# Patient Record
Sex: Female | Born: 1950
Health system: Southern US, Community
[De-identification: ages and names within clinical notes are randomized; demographics above are authoritative.]

## PROBLEM LIST (undated history)

## (undated) DIAGNOSIS — T7840XA Allergy, unspecified, initial encounter: Secondary | ICD-10-CM

## (undated) DIAGNOSIS — E079 Disorder of thyroid, unspecified: Secondary | ICD-10-CM

## (undated) DIAGNOSIS — K759 Inflammatory liver disease, unspecified: Secondary | ICD-10-CM

## (undated) DIAGNOSIS — F419 Anxiety disorder, unspecified: Secondary | ICD-10-CM

## (undated) DIAGNOSIS — K219 Gastro-esophageal reflux disease without esophagitis: Secondary | ICD-10-CM

## (undated) DIAGNOSIS — M1990 Unspecified osteoarthritis, unspecified site: Secondary | ICD-10-CM

## (undated) DIAGNOSIS — I10 Essential (primary) hypertension: Secondary | ICD-10-CM

## (undated) DIAGNOSIS — R112 Nausea with vomiting, unspecified: Secondary | ICD-10-CM

## (undated) DIAGNOSIS — Z87442 Personal history of urinary calculi: Secondary | ICD-10-CM

## (undated) DIAGNOSIS — G4730 Sleep apnea, unspecified: Secondary | ICD-10-CM

## (undated) DIAGNOSIS — Z9889 Other specified postprocedural states: Secondary | ICD-10-CM

## (undated) DIAGNOSIS — F32A Depression, unspecified: Secondary | ICD-10-CM

## (undated) DIAGNOSIS — H269 Unspecified cataract: Secondary | ICD-10-CM

## (undated) DIAGNOSIS — C801 Malignant (primary) neoplasm, unspecified: Secondary | ICD-10-CM

## (undated) DIAGNOSIS — T8859XA Other complications of anesthesia, initial encounter: Secondary | ICD-10-CM

## (undated) HISTORY — PX: EYE SURGERY: SHX253

## (undated) HISTORY — DX: Unspecified cataract: H26.9

## (undated) HISTORY — DX: Allergy, unspecified, initial encounter: T78.40XA

## (undated) HISTORY — DX: Unspecified osteoarthritis, unspecified site: M19.90

## (undated) HISTORY — PX: BREAST SURGERY: SHX581

## (undated) HISTORY — PX: OTHER SURGICAL HISTORY: SHX169

## (undated) HISTORY — DX: Essential (primary) hypertension: I10

## (undated) HISTORY — DX: Disorder of thyroid, unspecified: E07.9

## (undated) HISTORY — PX: SMALL INTESTINE SURGERY: SHX150

## (undated) HISTORY — PX: UMBILICAL HERNIA REPAIR: SHX196

## (undated) HISTORY — PX: HERNIA REPAIR: SHX51

---

## 1982-08-02 HISTORY — PX: OTHER SURGICAL HISTORY: SHX169

## 1982-08-02 HISTORY — PX: KNEE ARTHROSCOPY W/ DEBRIDEMENT: SHX1867

## 1983-08-03 HISTORY — PX: WISDOM TOOTH EXTRACTION: SHX21

## 1985-08-02 HISTORY — PX: CHOLECYSTECTOMY: SHX55

## 2006-01-26 ENCOUNTER — Other Ambulatory Visit: Admission: RE | Admit: 2006-01-26 | Discharge: 2006-01-26 | Payer: Self-pay | Admitting: Family Medicine

## 2007-06-07 ENCOUNTER — Other Ambulatory Visit: Admission: RE | Admit: 2007-06-07 | Discharge: 2007-06-07 | Payer: Self-pay | Admitting: Family Medicine

## 2008-06-11 ENCOUNTER — Other Ambulatory Visit: Admission: RE | Admit: 2008-06-11 | Discharge: 2008-06-11 | Payer: Self-pay | Admitting: Family Medicine

## 2009-09-19 ENCOUNTER — Other Ambulatory Visit: Admission: RE | Admit: 2009-09-19 | Discharge: 2009-09-19 | Payer: Self-pay | Admitting: Family Medicine

## 2012-06-20 ENCOUNTER — Ambulatory Visit
Admission: RE | Admit: 2012-06-20 | Discharge: 2012-06-20 | Disposition: A | Discharge status: Home / Self Care | Payer: BC Managed Care – PPO | Source: Ambulatory Visit | Attending: Family Medicine | Admitting: Family Medicine

## 2012-06-20 ENCOUNTER — Other Ambulatory Visit: Payer: Self-pay | Admitting: Family Medicine

## 2012-06-20 DIAGNOSIS — R319 Hematuria, unspecified: Secondary | ICD-10-CM

## 2013-07-03 ENCOUNTER — Other Ambulatory Visit (HOSPITAL_COMMUNITY)
Admission: RE | Admit: 2013-07-03 | Discharge: 2013-07-03 | Disposition: A | Discharge status: Home / Self Care | Payer: BC Managed Care – PPO | Source: Ambulatory Visit | Attending: Family Medicine | Admitting: Family Medicine

## 2013-07-03 ENCOUNTER — Other Ambulatory Visit: Payer: Self-pay | Admitting: Family Medicine

## 2013-07-03 DIAGNOSIS — Z124 Encounter for screening for malignant neoplasm of cervix: Secondary | ICD-10-CM | POA: Insufficient documentation

## 2013-07-03 DIAGNOSIS — Z1151 Encounter for screening for human papillomavirus (HPV): Secondary | ICD-10-CM | POA: Insufficient documentation

## 2013-08-02 HISTORY — PX: OTHER SURGICAL HISTORY: SHX169

## 2013-11-26 ENCOUNTER — Ambulatory Visit (INDEPENDENT_AMBULATORY_CARE_PROVIDER_SITE_OTHER): Payer: BC Managed Care – PPO

## 2013-11-26 ENCOUNTER — Ambulatory Visit (INDEPENDENT_AMBULATORY_CARE_PROVIDER_SITE_OTHER): Payer: BC Managed Care – PPO | Admitting: Podiatry

## 2013-11-26 ENCOUNTER — Encounter: Payer: Self-pay | Admitting: Podiatry

## 2013-11-26 VITALS — BP 111/82 | HR 74 | Resp 12

## 2013-11-26 DIAGNOSIS — M7750 Other enthesopathy of unspecified foot: Secondary | ICD-10-CM

## 2013-11-26 DIAGNOSIS — R52 Pain, unspecified: Secondary | ICD-10-CM

## 2013-11-26 MED ORDER — TRIAMCINOLONE ACETONIDE 10 MG/ML IJ SUSP
10.0000 mg | Freq: Once | INTRAMUSCULAR | Status: AC
  Administered 2013-11-26: 10 mg

## 2013-11-26 MED ORDER — DICLOFENAC SODIUM 75 MG PO TBEC: 75.0000 mg | DELAYED_RELEASE_TABLET | Freq: Two times a day (BID) | ORAL | Status: DC

## 2013-11-26 NOTE — Progress Notes (Signed)
Subjective:     Patient ID: Maria Wang, female   DOB: 02-14-1951, 63 y.o.   MRN: [id number]  Foot Pain   patient states the top of her left foot has been sore for about 4 months. Stated she has started a walking program prior to that and then it became irritated   Review of Systems  All other systems reviewed and are negative.      Objective:   Physical Exam  Nursing note and vitals reviewed. Constitutional: She is oriented to person, place, and time.  Cardiovascular: Intact distal pulses.   Musculoskeletal: Normal range of motion.  Neurological: She is oriented to person, place, and time.  Skin: Skin is warm.   neurovascular status is found to be intact with muscle strength adequate and range of motion of the subtalar and midtarsal joint within normal limits. Patient is found to have exquisite discomfort dorsum left foot around the ankle and distal with no indications of fracture     Assessment:     Probable tendinitis of the dorsal left foot with inflammation that has not responded to oral steroid agents and ice therapy    Plan:     H&P and x-rays reviewed. Went ahead and injected the left dorsal complex and tendon complex with 3 mg Kenalog 5 mg Xylocaine Marcaine mixture instructed on physical therapy and wider shoes and reappoint in 2 weeks and also discussed possible long-term orthotics

## 2013-11-26 NOTE — Progress Notes (Signed)
   Subjective:    Patient ID: Maria Wang, female    DOB: 1950-09-28, 63 y.o.   MRN: [id number]  HPI PT STATED LT FOOT IS BEEN PAINFUL FOR 4 MONTHS. THE FOOT IS GETTING WORSE. THE FOOT GET AGGRAVATED BY WALKING. TRIED TO USED ICE, NAPROXEN, AND IBUPROFEN AND IT HELPS.   Review of Systems  Constitutional: Positive for unexpected weight change.  Musculoskeletal: Positive for joint swelling.  Hematological: Bruises/bleeds easily.  All other systems reviewed and are negative.      Objective:   Physical Exam        Assessment & Plan:

## 2013-12-07 IMAGING — CT CT ABD-PELV W/O CM
1 of 4 series · 15 of 36 positions shown, 19 images · non-contrast
Comparison: None.

CLINICAL DATA: Right-sided flank pain and microscopic hematuria.

CT ABDOMEN AND PELVIS WITHOUT CONTRAST
TECHNIQUE: Multidetector CT imaging of the abdomen and pelvis was
performed following the standard protocol without intravenous
contrast.

[Series 2: renal stone w/o · axial · non-contrast · 0.98mm/px · z∈[-470,-50]mm · 15 of 92 slices shown, 19 images]
[im 4/92  soft-tissue]
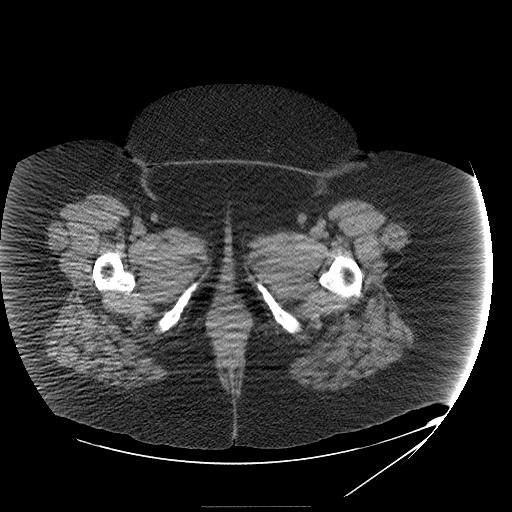
[im 4/92  bone]
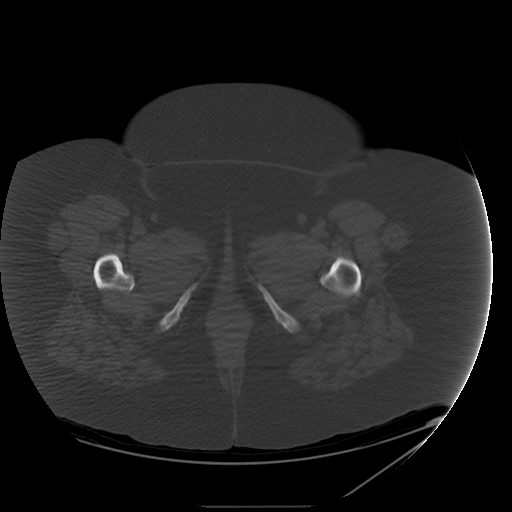
[im 12/92  soft-tissue]
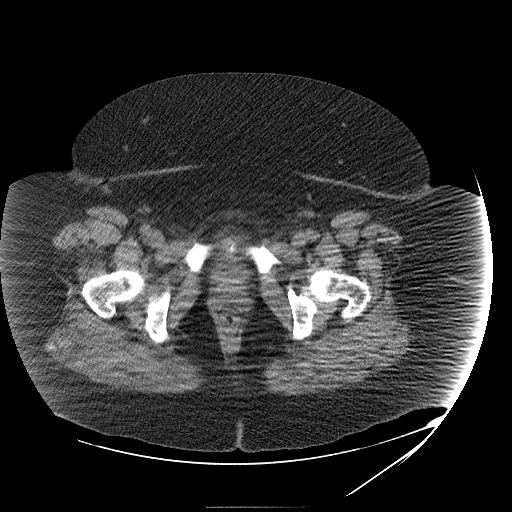
[im 19/92  soft-tissue]
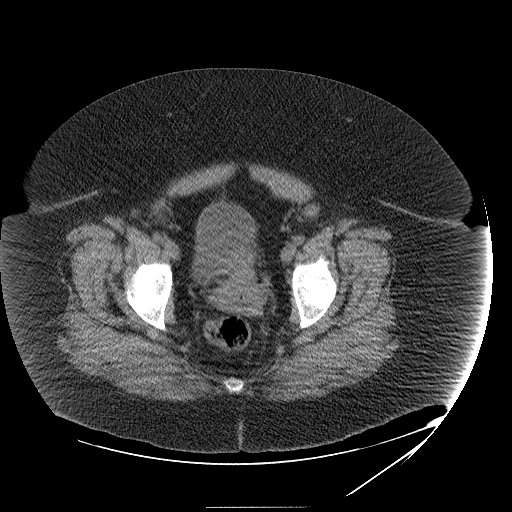
[im 27/92  soft-tissue]
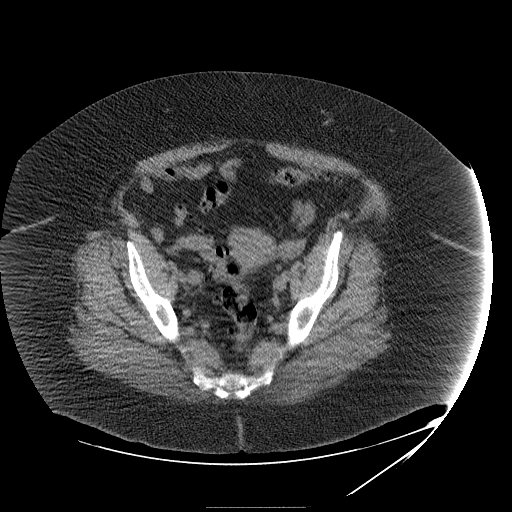
[im 31/92  soft-tissue]
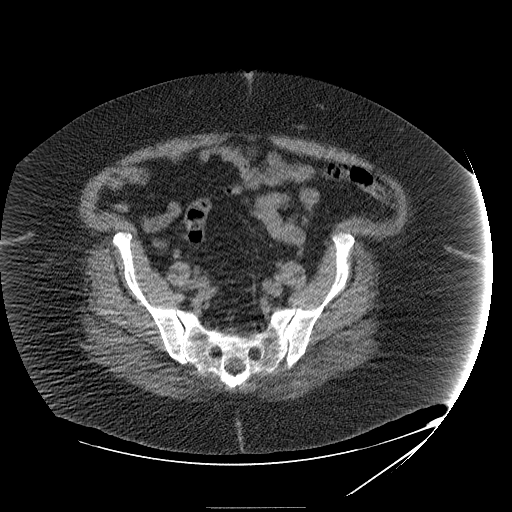
[im 38/92  soft-tissue]
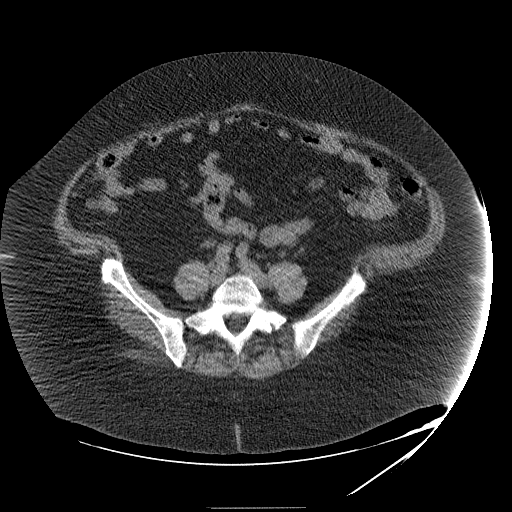
[im 46/92  soft-tissue]
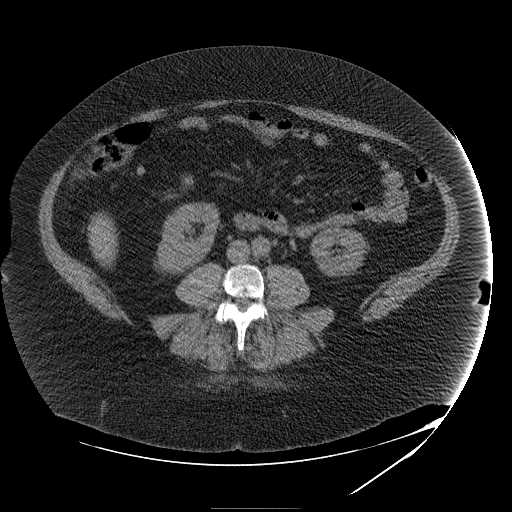
[im 54/92  soft-tissue]
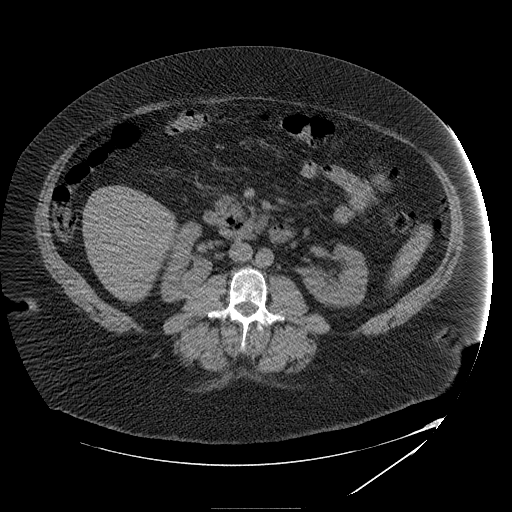
[im 61/92  soft-tissue]
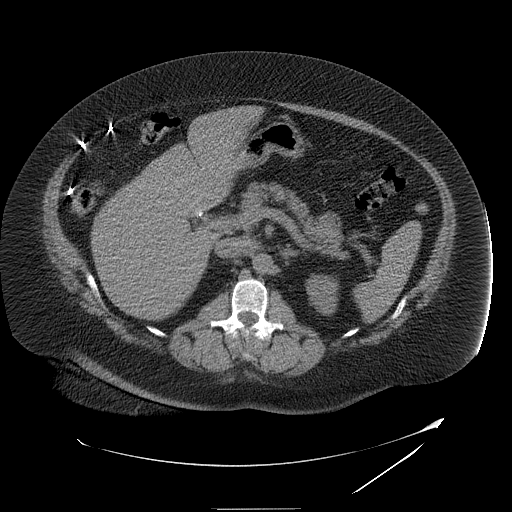
[im 61/92  bone]
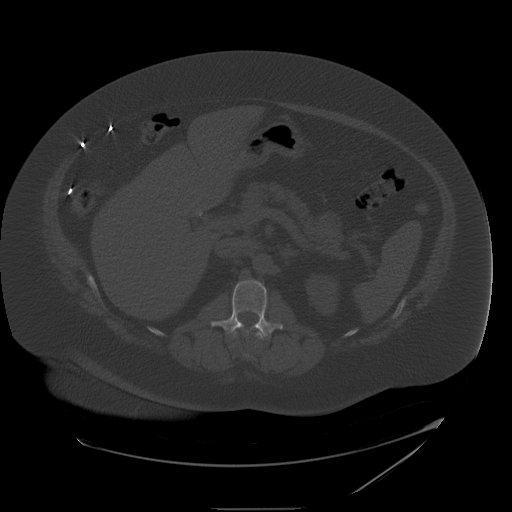
[im 65/92  soft-tissue]
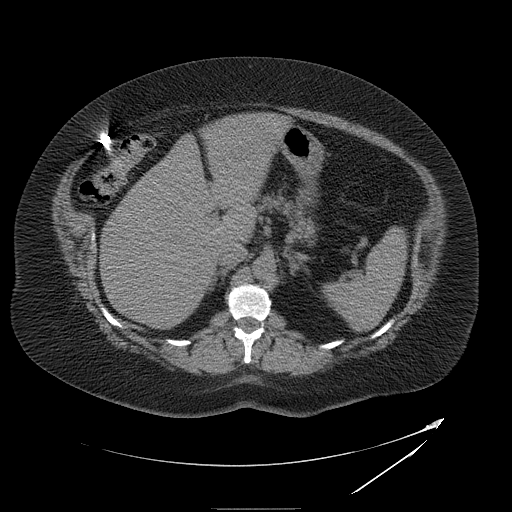
[im 73/92  soft-tissue]
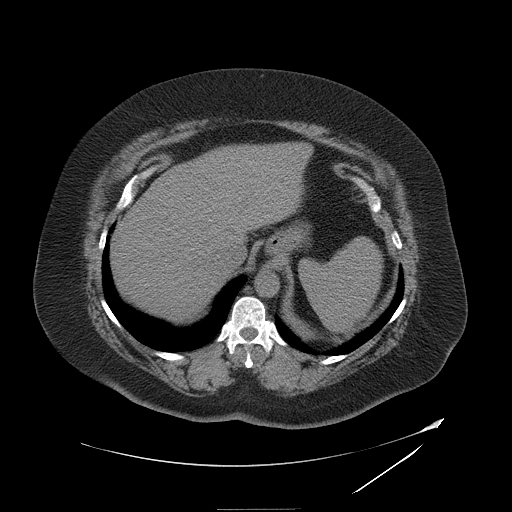
[im 76/92  lung]
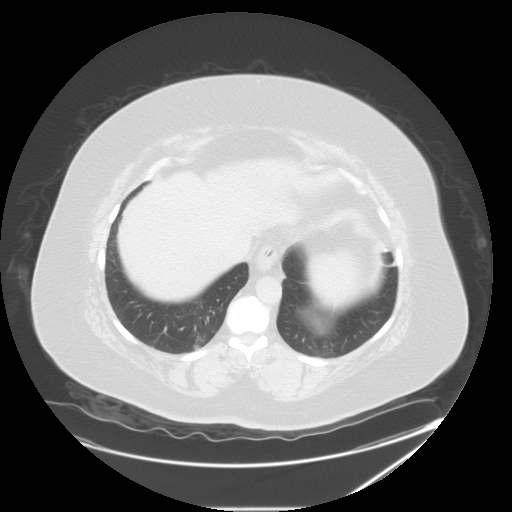
[im 80/92  soft-tissue]
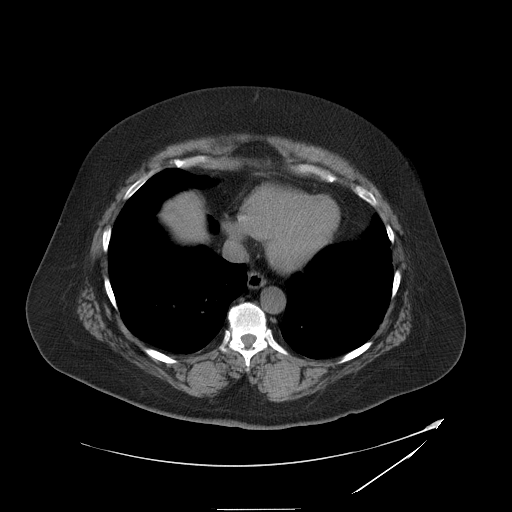
[im 80/92  lung]
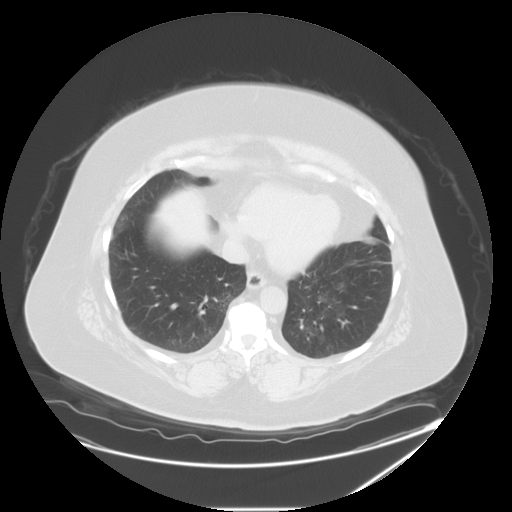
[im 84/92  lung]
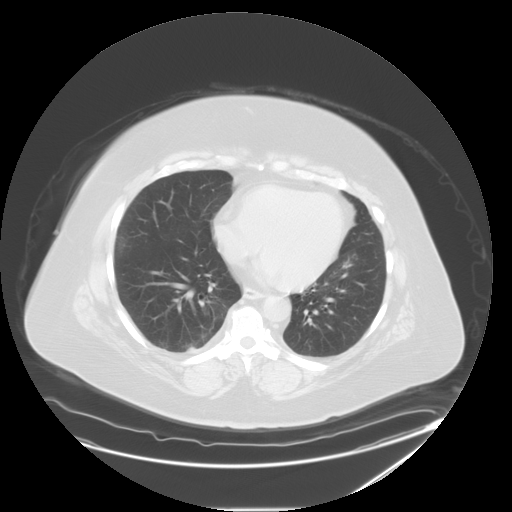
[im 88/92  soft-tissue]
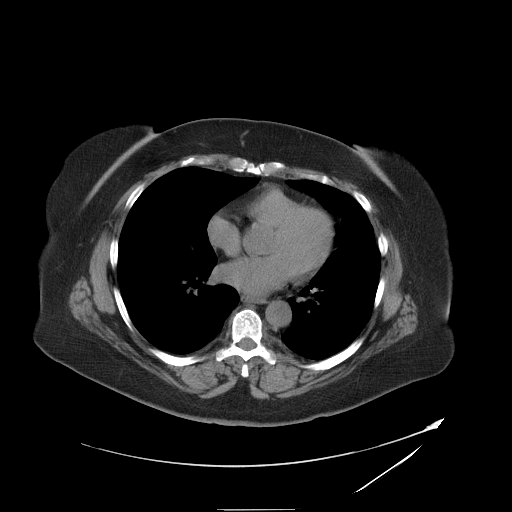
[im 88/92  lung]
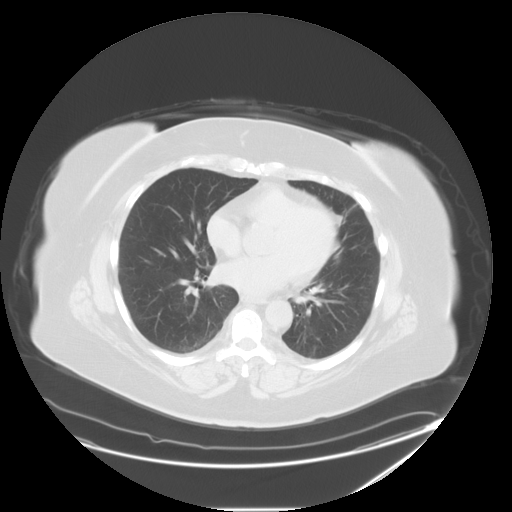

[15 of 36 positions shown; findings below may reference images not displayed]

FINDINGS: No evidence of renal, ureteral or bladder calculi.  No
obvious mass lesions are identified by unenhanced CT.

The gallbladder has been removed.  The liver, spleen, pancreas and
bowel are unremarkable.  No hernias are seen.  No free fluid or
abnormal fluid collections.  Bony structures are unremarkable.
IMPRESSION: Unremarkable unenhanced CT demonstrating no evidence of renal
calculi or other abnormalities.

## 2013-12-10 ENCOUNTER — Ambulatory Visit (INDEPENDENT_AMBULATORY_CARE_PROVIDER_SITE_OTHER): Payer: BC Managed Care – PPO | Admitting: Podiatry

## 2013-12-10 ENCOUNTER — Encounter: Payer: Self-pay | Admitting: Podiatry

## 2013-12-10 VITALS — BP 128/72 | HR 78 | Resp 16

## 2013-12-10 DIAGNOSIS — M7750 Other enthesopathy of unspecified foot: Secondary | ICD-10-CM

## 2013-12-10 NOTE — Progress Notes (Signed)
Subjective:     Patient ID: Maria Wang, female   DOB: [birth year], 63 y.o.   MRN: [id number]  HPI patient states that my left foot feel some better than previously with discomfort if him on it all the time with gradual recurrence of discomfort over the last couple of weeks   Review of Systems     Objective:   Physical Exam Neurovascular status intact with discomfort dorsal left foot which is reduced from previous but present with deep palpation    Assessment:     Tendinitis left foot with foot structure as part of the problem the patient is experiencing    Plan:     H&P performed and discussed continued physical therapy for the area future injections and I didn't go ahead today and I scanned for custom orthotics to reduce pressure against the feet

## 2013-12-28 ENCOUNTER — Ambulatory Visit (INDEPENDENT_AMBULATORY_CARE_PROVIDER_SITE_OTHER): Payer: BC Managed Care – PPO | Admitting: *Deleted

## 2013-12-28 DIAGNOSIS — M7750 Other enthesopathy of unspecified foot: Secondary | ICD-10-CM

## 2013-12-28 NOTE — Patient Instructions (Signed)

## 2013-12-28 NOTE — Progress Notes (Signed)
   Subjective:    Patient ID: Maria Wang, female    DOB: May 19, 1951, 63 y.o.   MRN: [id number]  HPI PICK UP ORTHOTICS AND GIVEN INSTRUCTION.    Review of Systems     Objective:   Physical Exam        Assessment & Plan:

## 2013-12-31 ENCOUNTER — Other Ambulatory Visit: Payer: BC Managed Care – PPO

## 2014-07-18 ENCOUNTER — Ambulatory Visit (INDEPENDENT_AMBULATORY_CARE_PROVIDER_SITE_OTHER): Payer: BC Managed Care – PPO

## 2014-07-18 ENCOUNTER — Encounter: Payer: Self-pay | Admitting: Podiatry

## 2014-07-18 ENCOUNTER — Ambulatory Visit (INDEPENDENT_AMBULATORY_CARE_PROVIDER_SITE_OTHER): Payer: BC Managed Care – PPO | Admitting: Podiatry

## 2014-07-18 VITALS — BP 159/78 | HR 58 | Resp 16

## 2014-07-18 DIAGNOSIS — M779 Enthesopathy, unspecified: Secondary | ICD-10-CM

## 2014-07-18 MED ORDER — TRIAMCINOLONE ACETONIDE 10 MG/ML IJ SUSP
10.0000 mg | Freq: Once | INTRAMUSCULAR | Status: AC
  Administered 2014-07-18: 10 mg

## 2014-07-21 NOTE — Progress Notes (Signed)
Subjective:     Patient ID: Maria Wang, female   DOB: [birth year], 63 y.o.   MRN: [id number]  HPI patient points to left foot stating I really have been hurting and it doesn't seem to quite be in the same place as it was last time   Review of Systems     Objective:   Physical Exam Neurovascular status intact with patient noted to have discomfort which is moved more to the subtalar joint and lateral ankle with inflammation and fluid buildup noted upon palpation    Assessment:     Inflammatory capsulitis tendinitis left ankle with pain mostly occurring within the subtalar joint    Plan:     Sinus tarsitis left with inflammatory changes that today I injected with 3 mg Kenalog 5 mg Xylocaine which was tolerated well. Reappoint if symptoms persist and may require MRI

## 2014-09-23 ENCOUNTER — Encounter: Payer: Self-pay | Admitting: Physician Assistant

## 2014-10-04 ENCOUNTER — Ambulatory Visit (INDEPENDENT_AMBULATORY_CARE_PROVIDER_SITE_OTHER): Payer: 59

## 2014-10-04 DIAGNOSIS — R42 Dizziness and giddiness: Secondary | ICD-10-CM

## 2014-10-04 NOTE — Progress Notes (Signed)
Exercise Treadmill Test  Pre-Exercise Testing Evaluation Rhythm: normal sinus  Rate: 72 bpm     Test  Exercise Tolerance Test Ordering MD: Sherren Mocha, MD  Interpreting MD: Nell Range, PA-C  Unique Test No: 1  Treadmill:  1  Indication for ETT: Dizzy  Contraindication to ETT: No   Stress Modality: exercise - treadmill  Cardiac Imaging Performed: non   Protocol: standard Bruce - maximal  Max BP:  242/90  Max MPHR (bpm):  157 85% MPR (bpm):  133  MPHR obtained (bpm):  146 % MPHR obtained: 93%  Reached 85% MPHR (min:sec): 2 min 27sec Total Exercise Time (min-sec):  19min  Workload in METS:  4.6 Borg Scale: 17  Reason ETT Terminated:  desired heart rate attained    ST Segment Analysis At Rest: normal ST segments - no evidence of significant ST depression With Exercise: no evidence of significant ST depression  Other Information Arrhythmia:  No  Few PVCs. Angina during ETT:  absent (0) Quality of ETT:  diagnostic  ETT Interpretation:  normal - no evidence of ischemia by ST analysis  Comments: She had some SOB i suspect from deconditioning. She did have some mild dizziness after her stress test that is typical for her. No CP.   Recommendations: Follow up with PCP.

## 2015-10-06 DIAGNOSIS — E2839 Other primary ovarian failure: Secondary | ICD-10-CM | POA: Diagnosis not present

## 2015-10-06 DIAGNOSIS — M8589 Other specified disorders of bone density and structure, multiple sites: Secondary | ICD-10-CM | POA: Diagnosis not present

## 2015-12-25 DIAGNOSIS — H40013 Open angle with borderline findings, low risk, bilateral: Secondary | ICD-10-CM | POA: Diagnosis not present

## 2015-12-25 DIAGNOSIS — H53023 Refractive amblyopia, bilateral: Secondary | ICD-10-CM | POA: Diagnosis not present

## 2015-12-25 DIAGNOSIS — H1859 Other hereditary corneal dystrophies: Secondary | ICD-10-CM | POA: Diagnosis not present

## 2015-12-25 DIAGNOSIS — H18603 Keratoconus, unspecified, bilateral: Secondary | ICD-10-CM | POA: Diagnosis not present

## 2016-01-12 DIAGNOSIS — G4733 Obstructive sleep apnea (adult) (pediatric): Secondary | ICD-10-CM | POA: Diagnosis not present

## 2016-01-27 DIAGNOSIS — G4733 Obstructive sleep apnea (adult) (pediatric): Secondary | ICD-10-CM | POA: Diagnosis not present

## 2016-01-29 DIAGNOSIS — G4733 Obstructive sleep apnea (adult) (pediatric): Secondary | ICD-10-CM | POA: Diagnosis not present

## 2016-02-25 ENCOUNTER — Ambulatory Visit
Admission: RE | Admit: 2016-02-25 | Discharge: 2016-02-25 | Disposition: A | Discharge status: Home / Self Care | Payer: PPO | Source: Ambulatory Visit | Attending: Family Medicine | Admitting: Family Medicine

## 2016-02-25 ENCOUNTER — Other Ambulatory Visit: Payer: Self-pay | Admitting: Family Medicine

## 2016-02-25 DIAGNOSIS — R609 Edema, unspecified: Secondary | ICD-10-CM

## 2016-02-25 DIAGNOSIS — M79604 Pain in right leg: Secondary | ICD-10-CM

## 2016-02-25 DIAGNOSIS — M79661 Pain in right lower leg: Secondary | ICD-10-CM | POA: Diagnosis not present

## 2016-02-25 DIAGNOSIS — M7989 Other specified soft tissue disorders: Secondary | ICD-10-CM | POA: Diagnosis not present

## 2016-02-25 DIAGNOSIS — I10 Essential (primary) hypertension: Secondary | ICD-10-CM | POA: Diagnosis not present

## 2016-02-28 DIAGNOSIS — G4733 Obstructive sleep apnea (adult) (pediatric): Secondary | ICD-10-CM | POA: Diagnosis not present

## 2016-03-10 DIAGNOSIS — I10 Essential (primary) hypertension: Secondary | ICD-10-CM | POA: Diagnosis not present

## 2016-03-24 DIAGNOSIS — I10 Essential (primary) hypertension: Secondary | ICD-10-CM | POA: Diagnosis not present

## 2016-03-29 DIAGNOSIS — G4733 Obstructive sleep apnea (adult) (pediatric): Secondary | ICD-10-CM | POA: Diagnosis not present

## 2016-03-30 DIAGNOSIS — G4733 Obstructive sleep apnea (adult) (pediatric): Secondary | ICD-10-CM | POA: Diagnosis not present

## 2016-04-09 DIAGNOSIS — W19XXXA Unspecified fall, initial encounter: Secondary | ICD-10-CM | POA: Diagnosis not present

## 2016-04-09 DIAGNOSIS — S8011XA Contusion of right lower leg, initial encounter: Secondary | ICD-10-CM | POA: Diagnosis not present

## 2016-04-30 DIAGNOSIS — G4733 Obstructive sleep apnea (adult) (pediatric): Secondary | ICD-10-CM | POA: Diagnosis not present

## 2016-05-30 DIAGNOSIS — G4733 Obstructive sleep apnea (adult) (pediatric): Secondary | ICD-10-CM | POA: Diagnosis not present

## 2016-06-30 DIAGNOSIS — G4733 Obstructive sleep apnea (adult) (pediatric): Secondary | ICD-10-CM | POA: Diagnosis not present

## 2016-07-08 ENCOUNTER — Ambulatory Visit (INDEPENDENT_AMBULATORY_CARE_PROVIDER_SITE_OTHER): Payer: PPO

## 2016-07-08 ENCOUNTER — Encounter: Payer: Self-pay | Admitting: Podiatry

## 2016-07-08 ENCOUNTER — Ambulatory Visit: Payer: PPO

## 2016-07-08 ENCOUNTER — Ambulatory Visit (INDEPENDENT_AMBULATORY_CARE_PROVIDER_SITE_OTHER): Payer: PPO | Admitting: Podiatry

## 2016-07-08 DIAGNOSIS — M779 Enthesopathy, unspecified: Secondary | ICD-10-CM | POA: Diagnosis not present

## 2016-07-08 DIAGNOSIS — M79672 Pain in left foot: Secondary | ICD-10-CM

## 2016-07-08 DIAGNOSIS — M79671 Pain in right foot: Secondary | ICD-10-CM

## 2016-07-08 MED ORDER — TRIAMCINOLONE ACETONIDE 10 MG/ML IJ SUSP
10.0000 mg | Freq: Once | INTRAMUSCULAR | Status: AC
  Administered 2016-07-08: 10 mg

## 2016-07-11 NOTE — Progress Notes (Signed)
Subjective:     Patient ID: Maria Wang, female   DOB: [birth year], 65 y.o.   MRN: [id number]  HPI patient presents with pain in the sinus tarsi bilateral and long-term problems with ambulation   Review of Systems     Objective:   Physical Exam Neurovascular status intact muscle strength adequate with moderate discomfort sinus tarsi bilateral and also dorsal tendinitis left    Assessment:     Inflammatory capsulitis bilateral with moderate structural abnormalities of the arch    Plan:     H&P x-rays reviewed and injected the inflammatory areas 3 mg Kenalog 5 mg Xylocaine discussed long-term orthotics and we'll make decision at next visit  X-ray report indicates mild arthritis with no indications of stress fracture or other bone pathology with depression of the arch noted upon x-ray

## 2016-07-15 ENCOUNTER — Ambulatory Visit (INDEPENDENT_AMBULATORY_CARE_PROVIDER_SITE_OTHER): Payer: PPO | Admitting: Podiatry

## 2016-07-15 ENCOUNTER — Encounter: Payer: Self-pay | Admitting: Podiatry

## 2016-07-15 DIAGNOSIS — M79672 Pain in left foot: Secondary | ICD-10-CM | POA: Diagnosis not present

## 2016-07-15 DIAGNOSIS — M779 Enthesopathy, unspecified: Secondary | ICD-10-CM

## 2016-07-15 NOTE — Progress Notes (Signed)
Subjective:     Patient ID: Maria Wang, female   DOB: 1951-01-19, 65 y.o.   MRN: [id number]  HPI patient states the pain is a lot better with minimal discomfort still in feeling that she needs support   Review of Systems     Objective:   Physical Exam Neurovascular status intact with inflammatory capsulitis forefoot right over left with tendinitis left also still noted    Assessment:     Inflammatory capsulitis tendinitis that's improved with treatment with structural biomechanical changes    Plan:     H&P condition reviewed and recommended long-term orthotics to reduce pressure. Patient is scanned for customized orthotic devices currently and was given instructions on physical therapy and shoe gear modifications. Reappoint to recheck

## 2016-07-30 DIAGNOSIS — G4733 Obstructive sleep apnea (adult) (pediatric): Secondary | ICD-10-CM | POA: Diagnosis not present

## 2016-08-06 ENCOUNTER — Ambulatory Visit (INDEPENDENT_AMBULATORY_CARE_PROVIDER_SITE_OTHER): Payer: Self-pay

## 2016-08-06 DIAGNOSIS — M779 Enthesopathy, unspecified: Secondary | ICD-10-CM

## 2016-08-06 NOTE — Patient Instructions (Signed)

## 2016-08-09 NOTE — Progress Notes (Signed)
Patient presents for orthotic pick up.  Verbal and written break in and wear instructions given.  Patient will follow up in 4 weeks if symptoms worsen or fail to improve. 

## 2016-08-16 DIAGNOSIS — H353132 Nonexudative age-related macular degeneration, bilateral, intermediate dry stage: Secondary | ICD-10-CM | POA: Diagnosis not present

## 2016-08-16 DIAGNOSIS — H40013 Open angle with borderline findings, low risk, bilateral: Secondary | ICD-10-CM | POA: Diagnosis not present

## 2016-08-16 DIAGNOSIS — H35033 Hypertensive retinopathy, bilateral: Secondary | ICD-10-CM | POA: Diagnosis not present

## 2016-08-30 DIAGNOSIS — G4733 Obstructive sleep apnea (adult) (pediatric): Secondary | ICD-10-CM | POA: Diagnosis not present

## 2016-08-31 DIAGNOSIS — Z1211 Encounter for screening for malignant neoplasm of colon: Secondary | ICD-10-CM | POA: Diagnosis not present

## 2016-08-31 DIAGNOSIS — K621 Rectal polyp: Secondary | ICD-10-CM | POA: Diagnosis not present

## 2016-08-31 DIAGNOSIS — K5730 Diverticulosis of large intestine without perforation or abscess without bleeding: Secondary | ICD-10-CM | POA: Diagnosis not present

## 2016-08-31 LAB — HM COLONOSCOPY

## 2016-09-03 DIAGNOSIS — Z1211 Encounter for screening for malignant neoplasm of colon: Secondary | ICD-10-CM | POA: Diagnosis not present

## 2016-09-03 DIAGNOSIS — K621 Rectal polyp: Secondary | ICD-10-CM | POA: Diagnosis not present

## 2016-09-07 DIAGNOSIS — H25012 Cortical age-related cataract, left eye: Secondary | ICD-10-CM | POA: Diagnosis not present

## 2016-09-07 DIAGNOSIS — H2512 Age-related nuclear cataract, left eye: Secondary | ICD-10-CM | POA: Diagnosis not present

## 2016-09-07 DIAGNOSIS — H25812 Combined forms of age-related cataract, left eye: Secondary | ICD-10-CM | POA: Diagnosis not present

## 2016-09-22 DIAGNOSIS — H2511 Age-related nuclear cataract, right eye: Secondary | ICD-10-CM | POA: Diagnosis not present

## 2016-09-22 DIAGNOSIS — H353132 Nonexudative age-related macular degeneration, bilateral, intermediate dry stage: Secondary | ICD-10-CM | POA: Diagnosis not present

## 2016-09-22 DIAGNOSIS — H25011 Cortical age-related cataract, right eye: Secondary | ICD-10-CM | POA: Diagnosis not present

## 2016-09-22 DIAGNOSIS — H35033 Hypertensive retinopathy, bilateral: Secondary | ICD-10-CM | POA: Diagnosis not present

## 2016-09-28 DIAGNOSIS — H25811 Combined forms of age-related cataract, right eye: Secondary | ICD-10-CM | POA: Diagnosis not present

## 2016-09-28 DIAGNOSIS — H2511 Age-related nuclear cataract, right eye: Secondary | ICD-10-CM | POA: Diagnosis not present

## 2016-09-29 DIAGNOSIS — G4733 Obstructive sleep apnea (adult) (pediatric): Secondary | ICD-10-CM | POA: Diagnosis not present

## 2016-10-04 DIAGNOSIS — H353132 Nonexudative age-related macular degeneration, bilateral, intermediate dry stage: Secondary | ICD-10-CM | POA: Diagnosis not present

## 2016-10-04 DIAGNOSIS — H35033 Hypertensive retinopathy, bilateral: Secondary | ICD-10-CM | POA: Diagnosis not present

## 2016-10-20 DIAGNOSIS — G4733 Obstructive sleep apnea (adult) (pediatric): Secondary | ICD-10-CM | POA: Diagnosis not present

## 2016-10-28 DIAGNOSIS — G4733 Obstructive sleep apnea (adult) (pediatric): Secondary | ICD-10-CM | POA: Diagnosis not present

## 2016-11-01 DIAGNOSIS — Z6841 Body Mass Index (BMI) 40.0 and over, adult: Secondary | ICD-10-CM | POA: Diagnosis not present

## 2016-11-01 DIAGNOSIS — Z23 Encounter for immunization: Secondary | ICD-10-CM | POA: Diagnosis not present

## 2016-11-01 DIAGNOSIS — Z0000 Encounter for general adult medical examination without abnormal findings: Secondary | ICD-10-CM | POA: Diagnosis not present

## 2016-11-01 DIAGNOSIS — E785 Hyperlipidemia, unspecified: Secondary | ICD-10-CM | POA: Diagnosis not present

## 2016-11-01 DIAGNOSIS — I10 Essential (primary) hypertension: Secondary | ICD-10-CM | POA: Diagnosis not present

## 2016-11-01 DIAGNOSIS — G4733 Obstructive sleep apnea (adult) (pediatric): Secondary | ICD-10-CM | POA: Diagnosis not present

## 2016-11-01 DIAGNOSIS — F3341 Major depressive disorder, recurrent, in partial remission: Secondary | ICD-10-CM | POA: Diagnosis not present

## 2016-11-28 DIAGNOSIS — G4733 Obstructive sleep apnea (adult) (pediatric): Secondary | ICD-10-CM | POA: Diagnosis not present

## 2016-12-28 DIAGNOSIS — G4733 Obstructive sleep apnea (adult) (pediatric): Secondary | ICD-10-CM | POA: Diagnosis not present

## 2017-01-28 DIAGNOSIS — G4733 Obstructive sleep apnea (adult) (pediatric): Secondary | ICD-10-CM | POA: Diagnosis not present

## 2017-02-10 DIAGNOSIS — H40013 Open angle with borderline findings, low risk, bilateral: Secondary | ICD-10-CM | POA: Diagnosis not present

## 2017-02-10 DIAGNOSIS — H18603 Keratoconus, unspecified, bilateral: Secondary | ICD-10-CM | POA: Diagnosis not present

## 2017-02-10 DIAGNOSIS — H1859 Other hereditary corneal dystrophies: Secondary | ICD-10-CM | POA: Diagnosis not present

## 2017-02-10 DIAGNOSIS — H04123 Dry eye syndrome of bilateral lacrimal glands: Secondary | ICD-10-CM | POA: Diagnosis not present

## 2017-03-29 DIAGNOSIS — G4733 Obstructive sleep apnea (adult) (pediatric): Secondary | ICD-10-CM | POA: Diagnosis not present

## 2017-05-06 DIAGNOSIS — A084 Viral intestinal infection, unspecified: Secondary | ICD-10-CM | POA: Diagnosis not present

## 2017-05-28 DIAGNOSIS — S51852A Open bite of left forearm, initial encounter: Secondary | ICD-10-CM | POA: Diagnosis not present

## 2017-05-28 DIAGNOSIS — W5501XA Bitten by cat, initial encounter: Secondary | ICD-10-CM | POA: Diagnosis not present

## 2017-05-30 DIAGNOSIS — S51852D Open bite of left forearm, subsequent encounter: Secondary | ICD-10-CM | POA: Diagnosis not present

## 2017-05-30 DIAGNOSIS — L02414 Cutaneous abscess of left upper limb: Secondary | ICD-10-CM | POA: Diagnosis not present

## 2017-05-30 DIAGNOSIS — W5501XA Bitten by cat, initial encounter: Secondary | ICD-10-CM | POA: Diagnosis not present

## 2017-06-01 DIAGNOSIS — S51852D Open bite of left forearm, subsequent encounter: Secondary | ICD-10-CM | POA: Diagnosis not present

## 2017-06-01 DIAGNOSIS — W5501XD Bitten by cat, subsequent encounter: Secondary | ICD-10-CM | POA: Diagnosis not present

## 2017-06-02 DIAGNOSIS — Z1231 Encounter for screening mammogram for malignant neoplasm of breast: Secondary | ICD-10-CM | POA: Diagnosis not present

## 2017-06-02 DIAGNOSIS — G4733 Obstructive sleep apnea (adult) (pediatric): Secondary | ICD-10-CM | POA: Diagnosis not present

## 2017-06-07 DIAGNOSIS — R922 Inconclusive mammogram: Secondary | ICD-10-CM | POA: Diagnosis not present

## 2017-07-07 DIAGNOSIS — H35371 Puckering of macula, right eye: Secondary | ICD-10-CM | POA: Diagnosis not present

## 2017-07-07 DIAGNOSIS — H35033 Hypertensive retinopathy, bilateral: Secondary | ICD-10-CM | POA: Diagnosis not present

## 2017-07-07 DIAGNOSIS — H353132 Nonexudative age-related macular degeneration, bilateral, intermediate dry stage: Secondary | ICD-10-CM | POA: Diagnosis not present

## 2017-07-07 DIAGNOSIS — H40013 Open angle with borderline findings, low risk, bilateral: Secondary | ICD-10-CM | POA: Diagnosis not present

## 2017-08-13 IMAGING — US US EXTREM LOW VENOUS*R*
1 series · 13 of 24 positions shown · non-contrast
Comparison: None.

CLINICAL DATA: Right leg swelling and pain



[Series 1: us extrem low venous*right* · 13 of 34 slices shown]
[im 1/34]
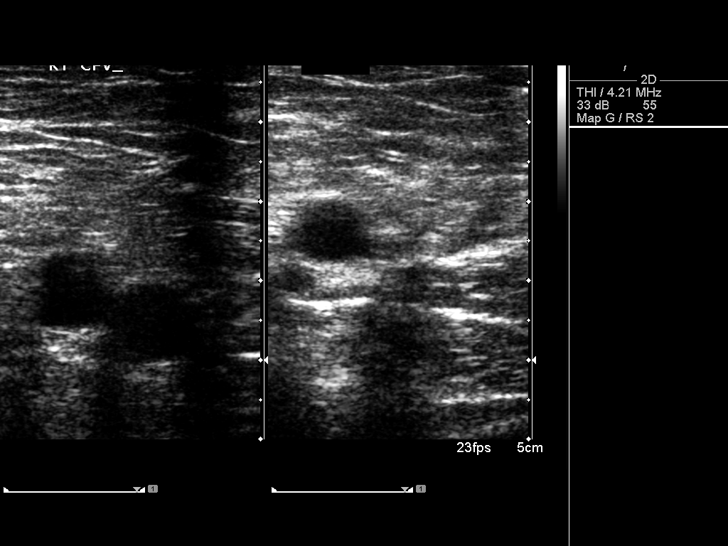
[im 3/34]
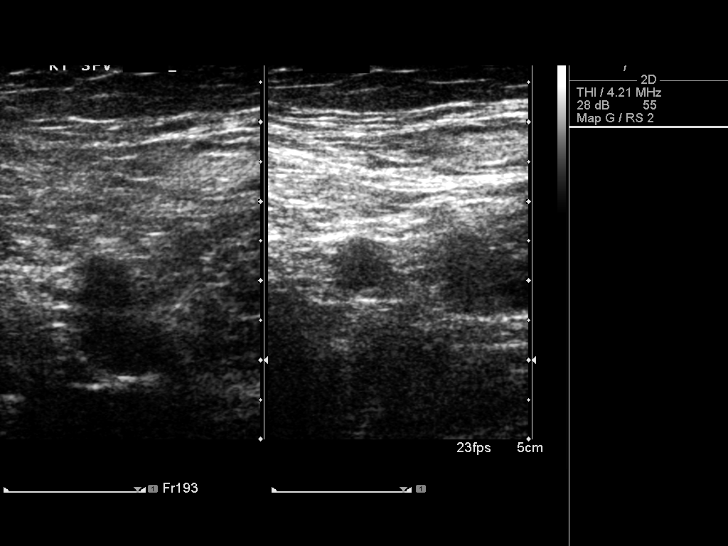
[im 6/34]
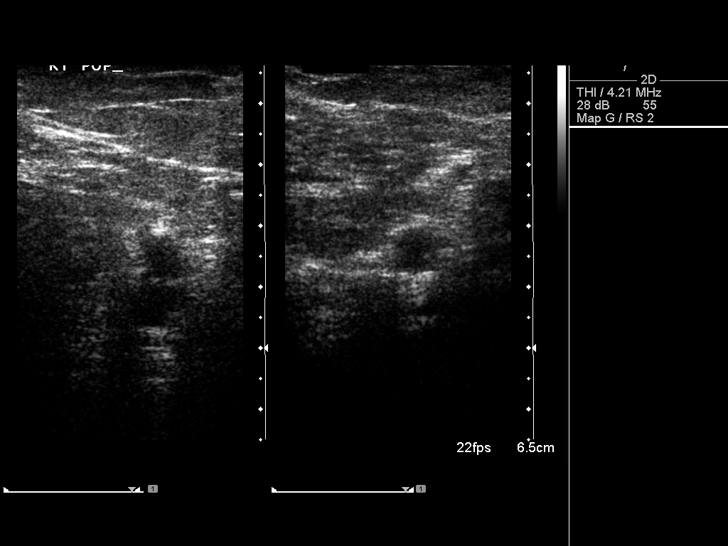
[im 9/34]
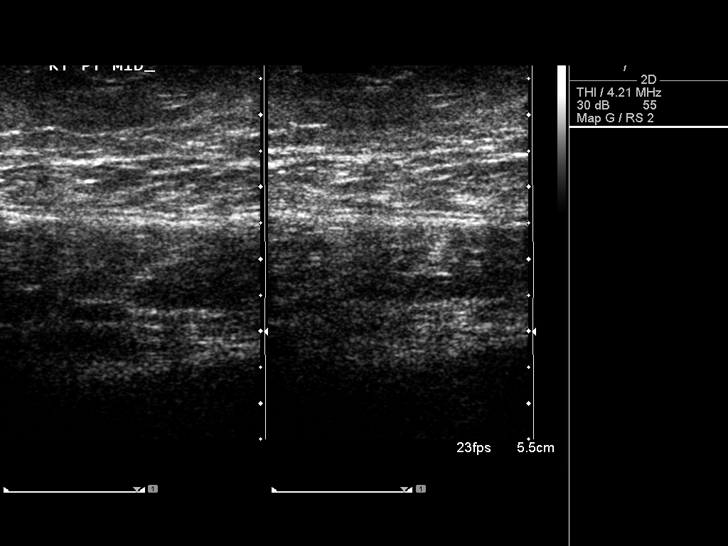
[im 12/34]
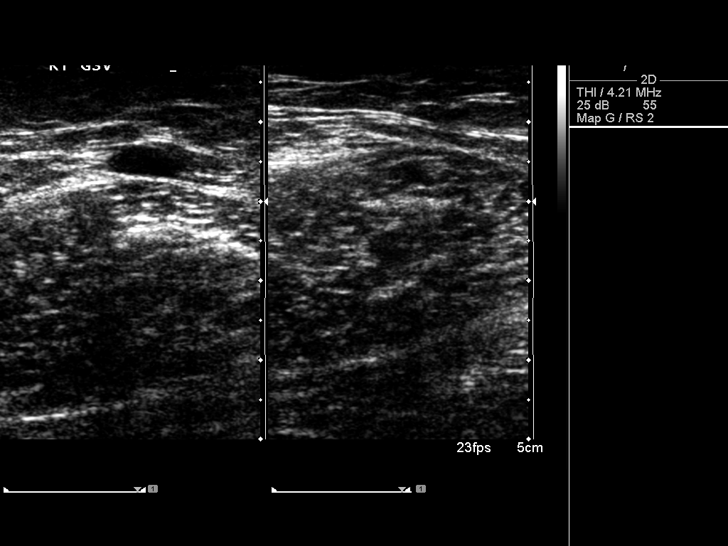
[im 15/34]
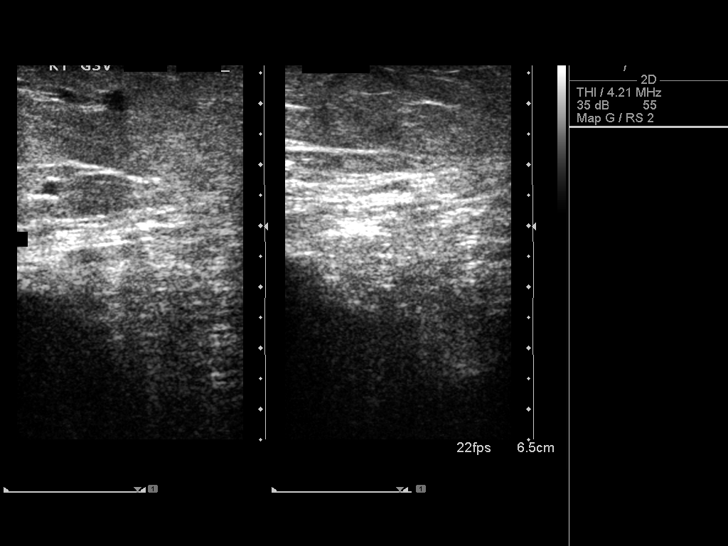
[im 18/34]
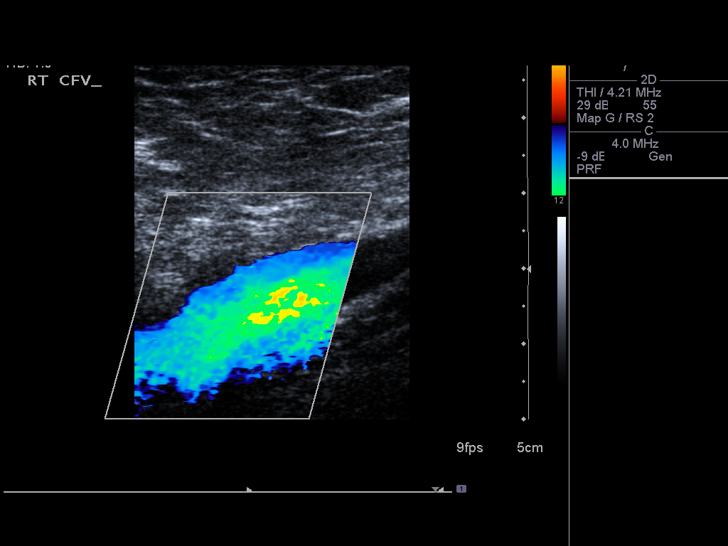
[im 19/34]
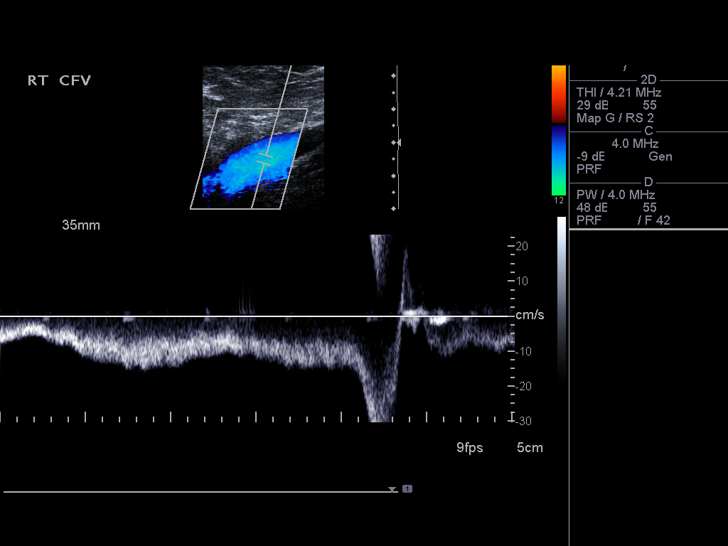
[im 22/34]
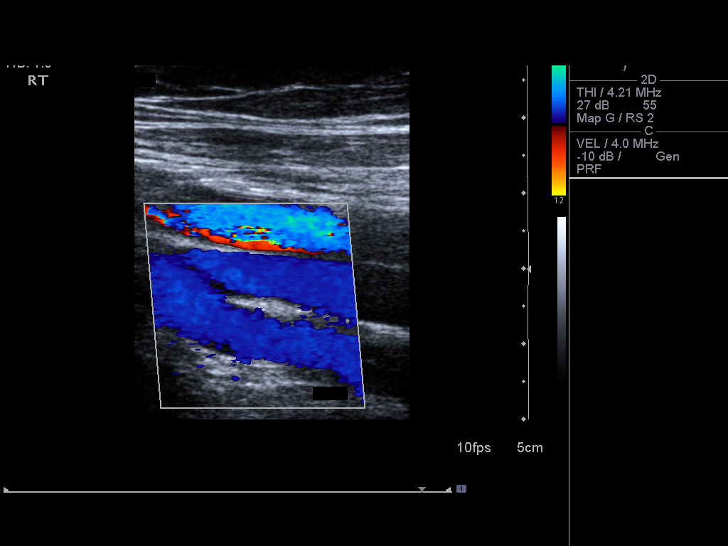
[im 25/34]
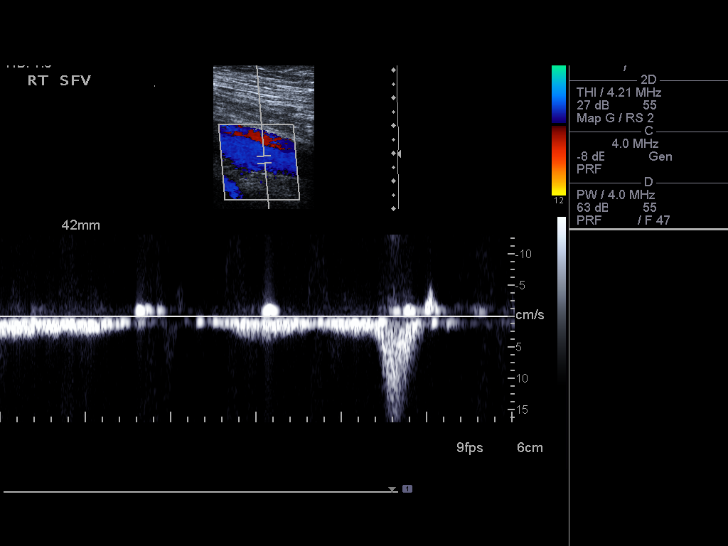
[im 28/34]
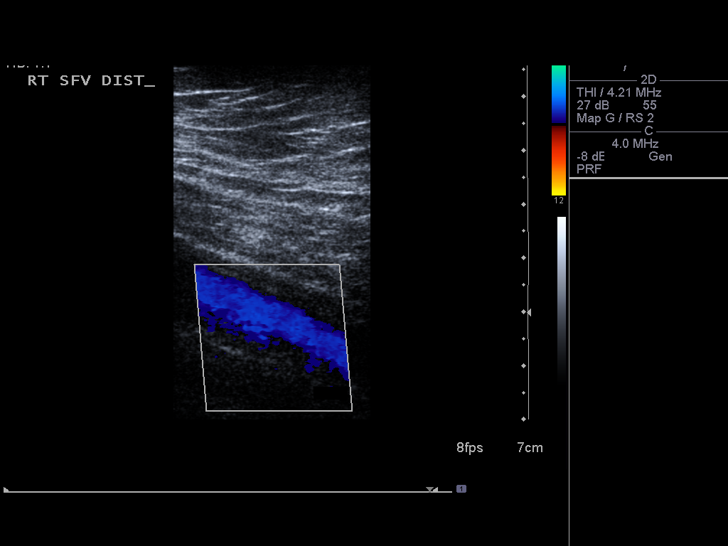
[im 31/34]
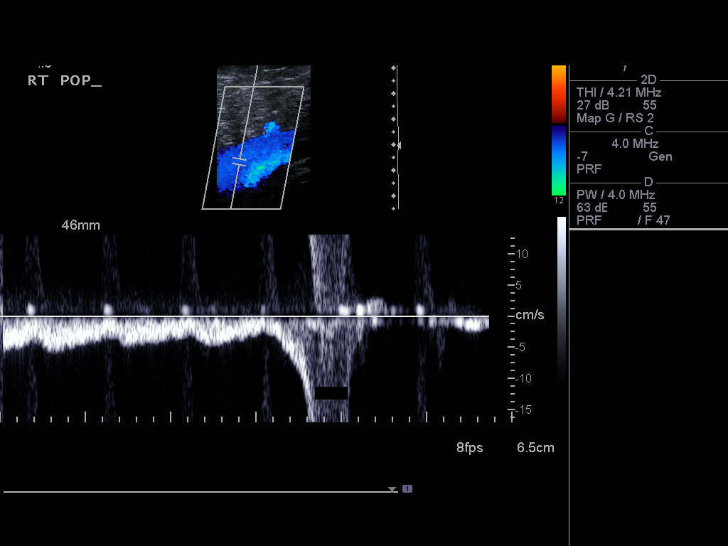
[im 34/34]
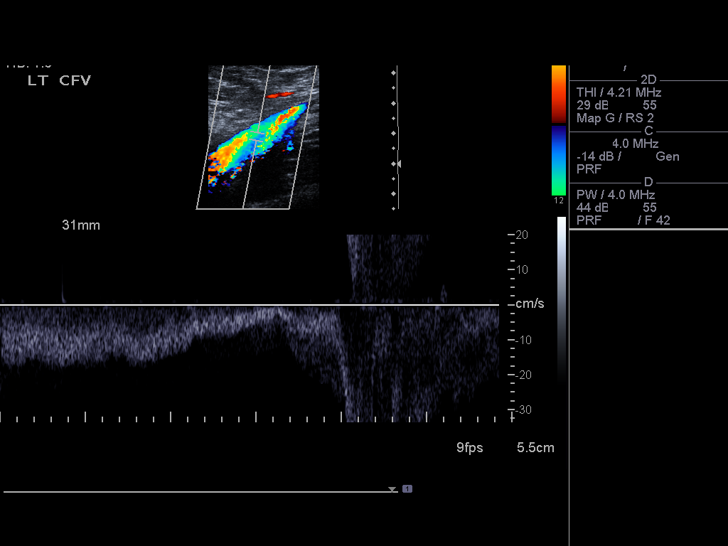

[13 of 24 positions shown; findings below may reference images not displayed]

FINDINGS: Contralateral Common Femoral Vein: Respiratory phasicity is normal
and symmetric with the symptomatic side. No evidence of thrombus.
Normal compressibility.

Common Femoral Vein: No evidence of thrombus. Normal
compressibility, respiratory phasicity and response to augmentation.

Saphenofemoral Junction: No evidence of thrombus. Normal
compressibility and flow on color Doppler imaging.

Profunda Femoral Vein: No evidence of thrombus. Normal
compressibility and flow on color Doppler imaging.

Femoral Vein: No evidence of thrombus. Normal compressibility,
respiratory phasicity and response to augmentation.

Popliteal Vein: No evidence of thrombus. Normal compressibility,
respiratory phasicity and response to augmentation.

Calf Veins: No evidence of thrombus. Normal compressibility and flow
on color Doppler imaging.

Superficial Great Saphenous Vein: No evidence of thrombus. Normal
compressibility and flow on color Doppler imaging.

Venous Reflux:  None.

Other Findings:  None.
IMPRESSION: No evidence of deep venous thrombosis.

## 2017-11-04 DIAGNOSIS — E785 Hyperlipidemia, unspecified: Secondary | ICD-10-CM | POA: Diagnosis not present

## 2017-11-04 DIAGNOSIS — Z0000 Encounter for general adult medical examination without abnormal findings: Secondary | ICD-10-CM | POA: Diagnosis not present

## 2017-11-04 DIAGNOSIS — I10 Essential (primary) hypertension: Secondary | ICD-10-CM | POA: Diagnosis not present

## 2017-11-04 DIAGNOSIS — Z23 Encounter for immunization: Secondary | ICD-10-CM | POA: Diagnosis not present

## 2018-03-29 DIAGNOSIS — G4733 Obstructive sleep apnea (adult) (pediatric): Secondary | ICD-10-CM | POA: Diagnosis not present

## 2018-08-10 DIAGNOSIS — H353132 Nonexudative age-related macular degeneration, bilateral, intermediate dry stage: Secondary | ICD-10-CM | POA: Diagnosis not present

## 2018-08-10 DIAGNOSIS — H35371 Puckering of macula, right eye: Secondary | ICD-10-CM | POA: Diagnosis not present

## 2018-08-10 DIAGNOSIS — H35033 Hypertensive retinopathy, bilateral: Secondary | ICD-10-CM | POA: Diagnosis not present

## 2018-08-10 DIAGNOSIS — H40013 Open angle with borderline findings, low risk, bilateral: Secondary | ICD-10-CM | POA: Diagnosis not present

## 2018-12-04 DIAGNOSIS — E785 Hyperlipidemia, unspecified: Secondary | ICD-10-CM | POA: Diagnosis not present

## 2018-12-04 DIAGNOSIS — I10 Essential (primary) hypertension: Secondary | ICD-10-CM | POA: Diagnosis not present

## 2018-12-06 DIAGNOSIS — F3341 Major depressive disorder, recurrent, in partial remission: Secondary | ICD-10-CM | POA: Diagnosis not present

## 2018-12-06 DIAGNOSIS — Z0000 Encounter for general adult medical examination without abnormal findings: Secondary | ICD-10-CM | POA: Diagnosis not present

## 2018-12-06 DIAGNOSIS — I10 Essential (primary) hypertension: Secondary | ICD-10-CM | POA: Diagnosis not present

## 2018-12-06 DIAGNOSIS — Z1211 Encounter for screening for malignant neoplasm of colon: Secondary | ICD-10-CM | POA: Diagnosis not present

## 2018-12-06 DIAGNOSIS — E785 Hyperlipidemia, unspecified: Secondary | ICD-10-CM | POA: Diagnosis not present

## 2019-02-22 DIAGNOSIS — H04123 Dry eye syndrome of bilateral lacrimal glands: Secondary | ICD-10-CM | POA: Diagnosis not present

## 2019-02-22 DIAGNOSIS — H40013 Open angle with borderline findings, low risk, bilateral: Secondary | ICD-10-CM | POA: Diagnosis not present

## 2019-02-22 DIAGNOSIS — H1859 Other hereditary corneal dystrophies: Secondary | ICD-10-CM | POA: Diagnosis not present

## 2019-02-22 DIAGNOSIS — H18603 Keratoconus, unspecified, bilateral: Secondary | ICD-10-CM | POA: Diagnosis not present

## 2019-04-02 DIAGNOSIS — G4733 Obstructive sleep apnea (adult) (pediatric): Secondary | ICD-10-CM | POA: Diagnosis not present

## 2019-05-02 DIAGNOSIS — Z23 Encounter for immunization: Secondary | ICD-10-CM | POA: Diagnosis not present

## 2019-08-09 DIAGNOSIS — G4733 Obstructive sleep apnea (adult) (pediatric): Secondary | ICD-10-CM | POA: Diagnosis not present

## 2019-08-13 DIAGNOSIS — H353132 Nonexudative age-related macular degeneration, bilateral, intermediate dry stage: Secondary | ICD-10-CM | POA: Diagnosis not present

## 2019-08-13 DIAGNOSIS — H40013 Open angle with borderline findings, low risk, bilateral: Secondary | ICD-10-CM | POA: Diagnosis not present

## 2019-08-13 DIAGNOSIS — H18603 Keratoconus, unspecified, bilateral: Secondary | ICD-10-CM | POA: Diagnosis not present

## 2019-08-13 DIAGNOSIS — H04123 Dry eye syndrome of bilateral lacrimal glands: Secondary | ICD-10-CM | POA: Diagnosis not present

## 2019-10-01 ENCOUNTER — Ambulatory Visit: Payer: PPO | Attending: Internal Medicine

## 2019-10-01 DIAGNOSIS — Z23 Encounter for immunization: Secondary | ICD-10-CM | POA: Insufficient documentation

## 2019-10-01 NOTE — Progress Notes (Signed)
   Covid-19 Vaccination Clinic  Name:  Elysia Eben    MRN: [id number] DOB: 19-Jun-1951  10/01/2019  Ms. Patchin was observed post Covid-19 immunization for 15 minutes without incidence. She was provided with Vaccine Information Sheet and instruction to access the V-Safe system.   Ms. Winkle was instructed to call 911 with any severe reactions post vaccine: Marland Kitchen Difficulty breathing  . Swelling of your face and throat  . A fast heartbeat  . A bad rash all over your body  . Dizziness and weakness    Immunizations Administered    Name Date Dose VIS Date Route   Pfizer COVID-19 Vaccine 10/01/2019  8:22 AM 0.3 mL 07/13/2019 Intramuscular   Manufacturer: [place]   Lot: [id number]   [place]: [id number]

## 2019-10-30 ENCOUNTER — Ambulatory Visit: Payer: PPO | Attending: Internal Medicine

## 2019-10-30 DIAGNOSIS — Z23 Encounter for immunization: Secondary | ICD-10-CM

## 2019-10-30 NOTE — Progress Notes (Signed)
   Covid-19 Vaccination Clinic  Name:  Maria Wang    MRN: [id number] DOB: May 09, 1951  10/30/2019  [name] was observed post Covid-19 immunization for 15 minutes without incident. She was provided with Vaccine Information Sheet and instruction to access the V-Safe system.   [name] was instructed to call 911 with any severe reactions post vaccine: Marland Kitchen Difficulty breathing  . Swelling of face and throat  . A fast heartbeat  . A bad rash all over body  . Dizziness and weakness   Immunizations Administered    Name Date Dose VIS Date Route   Pfizer COVID-19 Vaccine 10/30/2019  8:53 AM 0.3 mL 07/13/2019 Intramuscular   Manufacturer: [place]   Lot: [id number]   [place]: [id number]

## 2019-12-06 DIAGNOSIS — G4733 Obstructive sleep apnea (adult) (pediatric): Secondary | ICD-10-CM | POA: Diagnosis not present

## 2019-12-06 DIAGNOSIS — Z6841 Body Mass Index (BMI) 40.0 and over, adult: Secondary | ICD-10-CM | POA: Diagnosis not present

## 2019-12-06 DIAGNOSIS — Z713 Dietary counseling and surveillance: Secondary | ICD-10-CM | POA: Diagnosis not present

## 2019-12-06 DIAGNOSIS — F3341 Major depressive disorder, recurrent, in partial remission: Secondary | ICD-10-CM | POA: Diagnosis not present

## 2019-12-06 DIAGNOSIS — I10 Essential (primary) hypertension: Secondary | ICD-10-CM | POA: Diagnosis not present

## 2019-12-06 DIAGNOSIS — E785 Hyperlipidemia, unspecified: Secondary | ICD-10-CM | POA: Diagnosis not present

## 2019-12-06 DIAGNOSIS — H35033 Hypertensive retinopathy, bilateral: Secondary | ICD-10-CM | POA: Diagnosis not present

## 2019-12-06 DIAGNOSIS — Z0000 Encounter for general adult medical examination without abnormal findings: Secondary | ICD-10-CM | POA: Diagnosis not present

## 2019-12-06 DIAGNOSIS — M7750 Other enthesopathy of unspecified foot: Secondary | ICD-10-CM | POA: Diagnosis not present

## 2019-12-24 DIAGNOSIS — Z1231 Encounter for screening mammogram for malignant neoplasm of breast: Secondary | ICD-10-CM | POA: Diagnosis not present

## 2020-02-11 DIAGNOSIS — H40013 Open angle with borderline findings, low risk, bilateral: Secondary | ICD-10-CM | POA: Diagnosis not present

## 2020-03-28 ENCOUNTER — Other Ambulatory Visit: Payer: Self-pay

## 2020-05-06 DIAGNOSIS — Z23 Encounter for immunization: Secondary | ICD-10-CM | POA: Diagnosis not present

## 2020-05-07 ENCOUNTER — Other Ambulatory Visit: Payer: Self-pay

## 2020-05-07 ENCOUNTER — Encounter: Payer: Self-pay | Admitting: Podiatry

## 2020-05-07 ENCOUNTER — Ambulatory Visit: Payer: PPO | Admitting: Podiatry

## 2020-05-07 ENCOUNTER — Ambulatory Visit (INDEPENDENT_AMBULATORY_CARE_PROVIDER_SITE_OTHER): Payer: PPO

## 2020-05-07 DIAGNOSIS — M779 Enthesopathy, unspecified: Secondary | ICD-10-CM | POA: Diagnosis not present

## 2020-05-07 DIAGNOSIS — M7750 Other enthesopathy of unspecified foot: Secondary | ICD-10-CM | POA: Diagnosis not present

## 2020-05-07 DIAGNOSIS — M7752 Other enthesopathy of left foot: Secondary | ICD-10-CM | POA: Diagnosis not present

## 2020-05-07 DIAGNOSIS — M7672 Peroneal tendinitis, left leg: Secondary | ICD-10-CM

## 2020-05-07 NOTE — Progress Notes (Signed)
Subjective:   Patient ID: Maria Wang, female   DOB: 69 y.o.   MRN: [id number]   HPI Patient presents stating she is developed a lot of pain in the outside of her left foot and she is not been here for almost 4 years.  States she wants orthotics evaluated and states the pain has been quite debilitating for the last 2 months.  Patient does not smoke likes to be active   Review of Systems  All other systems reviewed and are negative.       Objective:  Physical Exam Vitals and nursing note reviewed.  Constitutional:      Appearance: She is well-developed.  Pulmonary:     Effort: Pulmonary effort is normal.  Musculoskeletal:        General: Normal range of motion.  Skin:    General: Skin is warm.  Neurological:     Mental Status: She is alert.     Neurovascular status intact muscle strength found to be adequate range of motion within normal limits with patient noted to have exquisite discomfort lateral side of the left foot around the peroneal tendon group.  It is localized and I did not note any distal inflammation or any other pathology especially within the sinus tarsi.  Patient is found to have good digital perfusion well oriented x3     Assessment:  Inflammatory peroneal tendinitis left with inflammation     Plan:  H&P x-ray reviewed condition discussed orthotics reviewed and do not recommend changing them currently and today I did sterile prep and injected around the peroneal tendon at its insertion 3 mg Dexasone Kenalog 5 mg Xylocaine advised on ice therapy anti-inflammatories.  Reappoint as needed  X-rays were negative for signs of fracture or advanced arthritic condition did indicate moderate pathology of the arch

## 2020-09-01 DIAGNOSIS — H353132 Nonexudative age-related macular degeneration, bilateral, intermediate dry stage: Secondary | ICD-10-CM | POA: Diagnosis not present

## 2020-09-01 DIAGNOSIS — H04123 Dry eye syndrome of bilateral lacrimal glands: Secondary | ICD-10-CM | POA: Diagnosis not present

## 2020-09-01 DIAGNOSIS — H40013 Open angle with borderline findings, low risk, bilateral: Secondary | ICD-10-CM | POA: Diagnosis not present

## 2020-09-01 DIAGNOSIS — H26493 Other secondary cataract, bilateral: Secondary | ICD-10-CM | POA: Diagnosis not present

## 2020-09-18 DIAGNOSIS — H26492 Other secondary cataract, left eye: Secondary | ICD-10-CM | POA: Diagnosis not present

## 2020-09-19 DIAGNOSIS — G4733 Obstructive sleep apnea (adult) (pediatric): Secondary | ICD-10-CM | POA: Diagnosis not present

## 2020-12-22 DIAGNOSIS — M8580 Other specified disorders of bone density and structure, unspecified site: Secondary | ICD-10-CM | POA: Diagnosis not present

## 2020-12-22 DIAGNOSIS — R1011 Right upper quadrant pain: Secondary | ICD-10-CM | POA: Diagnosis not present

## 2020-12-22 DIAGNOSIS — Z0000 Encounter for general adult medical examination without abnormal findings: Secondary | ICD-10-CM | POA: Diagnosis not present

## 2020-12-22 DIAGNOSIS — I10 Essential (primary) hypertension: Secondary | ICD-10-CM | POA: Diagnosis not present

## 2020-12-22 DIAGNOSIS — C499 Malignant neoplasm of connective and soft tissue, unspecified: Secondary | ICD-10-CM | POA: Diagnosis not present

## 2020-12-22 DIAGNOSIS — G4733 Obstructive sleep apnea (adult) (pediatric): Secondary | ICD-10-CM | POA: Diagnosis not present

## 2020-12-22 DIAGNOSIS — H35033 Hypertensive retinopathy, bilateral: Secondary | ICD-10-CM | POA: Diagnosis not present

## 2020-12-22 DIAGNOSIS — F3341 Major depressive disorder, recurrent, in partial remission: Secondary | ICD-10-CM | POA: Diagnosis not present

## 2020-12-22 DIAGNOSIS — E785 Hyperlipidemia, unspecified: Secondary | ICD-10-CM | POA: Diagnosis not present

## 2020-12-22 DIAGNOSIS — Z6841 Body Mass Index (BMI) 40.0 and over, adult: Secondary | ICD-10-CM | POA: Diagnosis not present

## 2021-01-05 DIAGNOSIS — Z780 Asymptomatic menopausal state: Secondary | ICD-10-CM | POA: Diagnosis not present

## 2021-01-05 DIAGNOSIS — M8589 Other specified disorders of bone density and structure, multiple sites: Secondary | ICD-10-CM | POA: Diagnosis not present

## 2021-01-05 DIAGNOSIS — Z1231 Encounter for screening mammogram for malignant neoplasm of breast: Secondary | ICD-10-CM | POA: Diagnosis not present

## 2021-03-19 DIAGNOSIS — H40013 Open angle with borderline findings, low risk, bilateral: Secondary | ICD-10-CM | POA: Diagnosis not present

## 2021-04-21 DIAGNOSIS — Z23 Encounter for immunization: Secondary | ICD-10-CM | POA: Diagnosis not present

## 2021-07-15 ENCOUNTER — Ambulatory Visit: Payer: PPO | Admitting: Podiatry

## 2021-07-15 ENCOUNTER — Ambulatory Visit (INDEPENDENT_AMBULATORY_CARE_PROVIDER_SITE_OTHER): Payer: PPO

## 2021-07-15 ENCOUNTER — Ambulatory Visit: Payer: PPO

## 2021-07-15 ENCOUNTER — Other Ambulatory Visit: Payer: Self-pay

## 2021-07-15 ENCOUNTER — Encounter: Payer: Self-pay | Admitting: Podiatry

## 2021-07-15 DIAGNOSIS — M7752 Other enthesopathy of left foot: Secondary | ICD-10-CM

## 2021-07-15 DIAGNOSIS — M7672 Peroneal tendinitis, left leg: Secondary | ICD-10-CM

## 2021-07-15 DIAGNOSIS — M779 Enthesopathy, unspecified: Secondary | ICD-10-CM

## 2021-07-15 MED ORDER — TRIAMCINOLONE ACETONIDE 10 MG/ML IJ SUSP
10.0000 mg | Freq: Once | INTRAMUSCULAR | Status: AC
  Administered 2021-07-15: 16:00:00 10 mg

## 2021-07-16 NOTE — Progress Notes (Signed)
SITUATION Reason for Consult: Evaluation for Bilateral Custom Foot Orthoses Patient / Caregiver Report: Patient is ready for foot orthotics  OBJECTIVE DATA: Patient History / Diagnosis:    ICD-10-CM   1. Capsulitis of ankle, left  M77.52      Current or Previous Devices: None and no history  Foot Examination: Skin presentation:   Intact Ulcers & [place]:   None and no history Toe / Foot Deformities:  Pes cavus Weight Bearing Presentation:  cavus Sensation:    Intact  ORTHOTIC RECOMMENDATION Recommended Device: 1x pair of custom functional foot orthotics  GOALS OF ORTHOSES - Reduce Pain - Prevent Foot Deformity - Prevent Progression of Further Foot Deformity - Relieve Pressure - Improve the Overall Biomechanical Function of the Foot and Lower Extremity.  ACTIONS PERFORMED Patient was casted for Foot Orthoses via crush box. Procedure was explained and patient tolerated procedure well. All questions were answered and concerns addressed.  PLAN Potential out of pocket cost was communicated to patient. Casts are to be sent to [facility] for fabrication. Patient is to be called for fitting when devices are ready.

## 2021-07-16 NOTE — Progress Notes (Signed)
Subjective:   Patient ID: Maria Wang, female   DOB: 70 y.o.   MRN: [id number]   HPI Patient presents stating she is developed pain in her left that seems different but is still present and she knows that she needs new orthotics to try to provide for better foot support and reduce the stress on her feet and especially the outside of both feet   ROS      Objective:  Physical Exam  Neurovascular status intact with inflammation that is more now centered in the sinus tarsi left and slightly lateral but no longer in the peroneal insertion with still's peroneal discomfort when she walks     Assessment:  Inflammatory capsulitis of the sinus tarsi left with chronic peroneal tendinitis     Plan:  H&P educated her on both conditions today I went ahead I did sterile prep and I injected the sinus tarsi 3 mg Kenalog 5 mg Xylocaine and met with the ped orthotist with patient and we decided on orthotics to try to lift the lateral side of the foot bilateral.  Patient will be seen back when those are returned  X-rays indicate that patient does have moderate collapse of the arch bilateral moderate midfoot arthritis no other pathology noted with discomfort in both feet upon questioning the patient in the outside

## 2021-07-17 ENCOUNTER — Telehealth: Payer: Self-pay | Admitting: Podiatry

## 2021-07-17 NOTE — Telephone Encounter (Signed)
Received HTA auth # [id number] for orthotics [phone] X2) valid 12.14.2022 thru 03.13.2023.Marland Kitchen

## 2021-08-12 ENCOUNTER — Other Ambulatory Visit: Payer: Self-pay

## 2021-08-12 ENCOUNTER — Ambulatory Visit: Payer: PPO

## 2021-08-12 DIAGNOSIS — M779 Enthesopathy, unspecified: Secondary | ICD-10-CM

## 2021-08-12 DIAGNOSIS — M7672 Peroneal tendinitis, left leg: Secondary | ICD-10-CM

## 2021-08-12 DIAGNOSIS — M7752 Other enthesopathy of left foot: Secondary | ICD-10-CM

## 2021-08-12 NOTE — Progress Notes (Signed)
SITUATION: Reason for Visit: Fitting and Delivery of Custom Fabricated Foot Orthoses Patient Report: Patient reports comfort and is satisfied with device.  OBJECTIVE DATA: Patient History / Diagnosis:     ICD-10-CM   1. Peroneal tendinitis of left lower extremity  M76.72     2. Capsulitis of ankle, left  M77.52       Provided Device:  Functional foot orthotics  GOAL OF ORTHOSIS - Improve gait - Decrease energy expenditure - Improve Balance - Provide Triplanar stability of foot complex - Facilitate motion  ACTIONS PERFORMED Patient was fit with foot orthotics trimmed to shoe last. Patient tolerated fittign procedure. Device was modified as follows to better fit patient: - Toe plate was trimmed to shoe last  Patient was provided with verbal and written instruction and demonstration regarding donning, doffing, wear, care, proper fit, function, purpose, cleaning, and use of the orthosis and in all related precautions and risks and benefits regarding the orthosis.  Patient was also provided with verbal instruction regarding how to report any failures or malfunctions of the orthosis and necessary follow up care. Patient was also instructed to contact our office regarding any change in status that may affect the function of the orthosis.  Patient demonstrated independence with proper donning, doffing, and fit and verbalized understanding of all instructions.  PLAN: Patient is to follow up in one week or as necessary (PRN). All questions were answered and concerns addressed. Plan of care was discussed with and agreed upon by the patient.

## 2021-09-01 ENCOUNTER — Telehealth: Payer: Self-pay | Admitting: Podiatry

## 2021-09-01 NOTE — Telephone Encounter (Signed)
Refurbished orthotics in.. pt aware ok to pick up. 

## 2021-10-28 ENCOUNTER — Other Ambulatory Visit (HOSPITAL_BASED_OUTPATIENT_CLINIC_OR_DEPARTMENT_OTHER): Payer: Self-pay | Admitting: Family Medicine

## 2021-10-28 ENCOUNTER — Other Ambulatory Visit: Payer: Self-pay

## 2021-10-28 ENCOUNTER — Ambulatory Visit (HOSPITAL_BASED_OUTPATIENT_CLINIC_OR_DEPARTMENT_OTHER)
Admission: RE | Admit: 2021-10-28 | Discharge: 2021-10-28 | Disposition: A | Discharge status: Home / Self Care | Payer: PPO | Source: Ambulatory Visit | Attending: Family Medicine | Admitting: Family Medicine

## 2021-10-28 ENCOUNTER — Encounter (HOSPITAL_BASED_OUTPATIENT_CLINIC_OR_DEPARTMENT_OTHER): Payer: Self-pay

## 2021-10-28 DIAGNOSIS — I700 Atherosclerosis of aorta: Secondary | ICD-10-CM | POA: Diagnosis not present

## 2021-10-28 DIAGNOSIS — R109 Unspecified abdominal pain: Secondary | ICD-10-CM | POA: Diagnosis not present

## 2021-10-28 DIAGNOSIS — R1084 Generalized abdominal pain: Secondary | ICD-10-CM | POA: Diagnosis not present

## 2021-10-28 LAB — POCT I-STAT CREATININE: Creatinine, Ser: 0.9 mg/dL (ref 0.44–1.00)

## 2021-10-28 MED ORDER — IOHEXOL 300 MG/ML  SOLN
100.0000 mL | Freq: Once | INTRAMUSCULAR | Status: AC | PRN
  Administered 2021-10-28: 100 mL via INTRAVENOUS

## 2021-10-29 DIAGNOSIS — R109 Unspecified abdominal pain: Secondary | ICD-10-CM | POA: Diagnosis not present

## 2021-10-29 DIAGNOSIS — R935 Abnormal findings on diagnostic imaging of other abdominal regions, including retroperitoneum: Secondary | ICD-10-CM | POA: Diagnosis not present

## 2021-10-30 DIAGNOSIS — K5710 Diverticulosis of small intestine without perforation or abscess without bleeding: Secondary | ICD-10-CM | POA: Diagnosis not present

## 2021-10-30 DIAGNOSIS — K289 Gastrojejunal ulcer, unspecified as acute or chronic, without hemorrhage or perforation: Secondary | ICD-10-CM | POA: Diagnosis not present

## 2021-10-30 DIAGNOSIS — R1013 Epigastric pain: Secondary | ICD-10-CM | POA: Diagnosis not present

## 2021-10-30 DIAGNOSIS — R933 Abnormal findings on diagnostic imaging of other parts of digestive tract: Secondary | ICD-10-CM | POA: Diagnosis not present

## 2021-10-30 DIAGNOSIS — K633 Ulcer of intestine: Secondary | ICD-10-CM | POA: Diagnosis not present

## 2021-10-30 DIAGNOSIS — K6389 Other specified diseases of intestine: Secondary | ICD-10-CM | POA: Diagnosis not present

## 2021-10-30 DIAGNOSIS — K449 Diaphragmatic hernia without obstruction or gangrene: Secondary | ICD-10-CM | POA: Diagnosis not present

## 2021-11-05 DIAGNOSIS — K289 Gastrojejunal ulcer, unspecified as acute or chronic, without hemorrhage or perforation: Secondary | ICD-10-CM | POA: Diagnosis not present

## 2021-11-18 DIAGNOSIS — E785 Hyperlipidemia, unspecified: Secondary | ICD-10-CM | POA: Diagnosis present

## 2021-11-18 DIAGNOSIS — F3341 Major depressive disorder, recurrent, in partial remission: Secondary | ICD-10-CM | POA: Diagnosis present

## 2021-11-18 DIAGNOSIS — I10 Essential (primary) hypertension: Secondary | ICD-10-CM | POA: Diagnosis present

## 2021-11-18 DIAGNOSIS — Z6841 Body Mass Index (BMI) 40.0 and over, adult: Secondary | ICD-10-CM

## 2021-11-18 DIAGNOSIS — M8580 Other specified disorders of bone density and structure, unspecified site: Secondary | ICD-10-CM | POA: Insufficient documentation

## 2021-11-18 DIAGNOSIS — K5710 Diverticulosis of small intestine without perforation or abscess without bleeding: Secondary | ICD-10-CM | POA: Diagnosis present

## 2021-11-18 DIAGNOSIS — K6389 Other specified diseases of intestine: Secondary | ICD-10-CM | POA: Diagnosis not present

## 2021-11-18 DIAGNOSIS — H35039 Hypertensive retinopathy, unspecified eye: Secondary | ICD-10-CM | POA: Diagnosis present

## 2021-11-18 DIAGNOSIS — F3340 Major depressive disorder, recurrent, in remission, unspecified: Secondary | ICD-10-CM | POA: Diagnosis present

## 2021-11-18 DIAGNOSIS — K289 Gastrojejunal ulcer, unspecified as acute or chronic, without hemorrhage or perforation: Secondary | ICD-10-CM | POA: Diagnosis present

## 2021-11-18 DIAGNOSIS — G4733 Obstructive sleep apnea (adult) (pediatric): Secondary | ICD-10-CM | POA: Diagnosis present

## 2021-11-18 DIAGNOSIS — C499 Malignant neoplasm of connective and soft tissue, unspecified: Secondary | ICD-10-CM | POA: Insufficient documentation

## 2021-12-16 NOTE — Progress Notes (Signed)
Sent message, via epic in basket, requesting order in epic from surgeon  ? ? ? 12/16/21 1594  ?Preop Orders  ?Has preop orders? No  ?Name of staff/physician contacted for orders(Indicate phone or IB message) M. Hassell Done, MD  ? ? ?

## 2021-12-23 ENCOUNTER — Encounter (HOSPITAL_COMMUNITY)
Admission: RE | Admit: 2021-12-23 | Discharge: 2021-12-23 | Disposition: A | Discharge status: Home / Self Care | Payer: PPO | Source: Ambulatory Visit | Attending: Family Medicine | Admitting: Family Medicine

## 2021-12-23 NOTE — Progress Notes (Addendum)
Anesthesia Review:  PCP: [facility]  Cardiologist : none  Chest x-ray : EKG : 12/31/21  Echo : Stress test: Cardiac Cath :  Activity level:  cannot do a flight of stairs without difficulty  Sleep Study/ CPAP : has cpap  Fasting Blood Sugar :      / Checks Blood Sugar -- times a day:   Blood Thinner/ Instructions /Last Dose: ASA / Instructions/ Last Dose :   Requested orders on 12/23/21 and 12/31/21.   No orders in at time of preop appt.  Spoke with CCS Traige to request orders.  Instructed pt at time of preop appt and written on instructions for pt to call [phone] to obtain blowel prep instructions.  PT voiced understanding.

## 2021-12-23 NOTE — Progress Notes (Addendum)
DUE TO COVID-19 ONLY ONE VISITOR IS ALLOWED TO COME WITH YOU AND STAY IN THE WAITING ROOM ONLY DURING PRE OP AND PROCEDURE DAY OF SURGERY.  2 VISITOR  MAY VISIT WITH YOU AFTER SURGERY IN YOUR PRIVATE ROOM DURING VISITING HOURS ONLY! YOU MAY HAVE ONE PERSON SPEND THE NITE WITH YOU IN YOUR ROOM AFTER SURGERY.    YOU NEED TO HAVE A COVID 19 TEST ON                                 COME THRU MAIN ENTRANCE AT  HAVE A SEAT IN THE LOBBY ON THE RIGHT AS YOU COME THRU THE DOOR.  CALL [phone] AND GIVE THEM YOUR NAME AND LET THEM KNOW YOU ARE HERE FOR COVID TESTING.    Your procedure is scheduled on:  01/08/2022   Report to [facility] Main  Entrance   Report to admitting at      0515           AM DO NOT [place], PICTURE ID OR WALLET DAY OF SURGERY.      Call this number if you have problems the morning of surgery [phone]    REMEMBER: NO  SOLID FOODS , CANDY, GUM OR MINTS AFTER [place] .       Marland Kitchen CLEAR LIQUIDS UNTIL     0430am             DAY OF SURGERY.        CLEAR LIQUID DIET   Foods Allowed      WATER BLACK COFFEE ( SUGAR OK, NO MILK, CREAM OR CREAMER) REGULAR AND DECAF  TEA ( SUGAR OK NO MILK, CREAM, OR CREAMER) REGULAR AND DECAF  PLAIN JELLO ( NO RED)  FRUIT ICES ( NO RED, NO FRUIT PULP)  POPSICLES ( NO RED)  JUICE- APPLE, WHITE GRAPE AND WHITE CRANBERRY  SPORT DRINK LIKE GATORADE ( NO RED)  CLEAR BROTH ( VEGETABLE , CHICKEN OR BEEF)                                                                     BRUSH YOUR TEETH MORNING OF SURGERY AND RINSE YOUR MOUTH OUT, NO CHEWING GUM CANDY OR MINTS.     Take these medicines the morning of surgery with A SIP OF WATER:  wellbutrin, prozac, gabapentin    DO NOT TAKE ANY DIABETIC MEDICATIONS DAY OF YOUR SURGERY                               You may not have any metal on your body including hair pins and              piercings  Do not wear jewelry, make-up, lotions, powders or  perfumes, deodorant             Do not wear nail polish on your fingernails.              IF YOU ARE A FEMALE AND WANT TO SHAVE UNDER ARMS OR LEGS PRIOR TO SURGERY YOU MUST DO SO AT LEAST 48  HOURS PRIOR TO SURGERY.              Men may shave face and neck.   Do not bring valuables to the hospital. [place].  Contacts, dentures or bridgework may not be worn into surgery.  Leave suitcase in the car. After surgery it may be brought to your room.     Patients discharged the day of surgery will not be allowed to drive home. IF YOU ARE HAVING SURGERY AND GOING HOME THE SAME DAY, YOU MUST HAVE AN ADULT TO DRIVE YOU HOME AND BE WITH YOU FOR 24 HOURS. YOU MAY GO HOME BY TAXI OR UBER OR ORTHERWISE, BUT AN ADULT MUST ACCOMPANY YOU HOME AND STAY WITH YOU FOR 24 HOURS.                Please read over the following fact sheets you were given: _____________________________________________________________________  [facility] - Preparing for Surgery Before surgery, you can play an important role.  Because skin is not sterile, your skin needs to be as free of germs as possible.  You can reduce the number of germs on your skin by washing with CHG (chlorahexidine gluconate) soap before surgery.  CHG is an antiseptic cleaner which kills germs and bonds with the skin to continue killing germs even after washing. Please DO NOT use if you have an allergy to CHG or antibacterial soaps.  If your skin becomes reddened/irritated stop using the CHG and inform your nurse when you arrive at Short Stay. Do not shave (including legs and underarms) for at least 48 hours prior to the first CHG shower.  You may shave your face/neck. Please follow these instructions carefully:  1.  Shower with CHG Soap the night before surgery and the  morning of Surgery.  2.  If you choose to wash your hair, wash your hair first as usual with your  normal  shampoo.  3.  After you shampoo, rinse  your hair and body thoroughly to remove the  shampoo.                           4.  Use CHG as you would any other liquid soap.  You can apply chg directly  to the skin and wash                       Gently with a scrungie or clean washcloth.  5.  Apply the CHG Soap to your body ONLY FROM THE NECK DOWN.   Do not use on face/ open                           Wound or open sores. Avoid contact with eyes, ears mouth and genitals (private parts).                       Wash face,  Genitals (private parts) with your normal soap.             6.  Wash thoroughly, paying special attention to the area where your surgery  will be performed.  7.  Thoroughly rinse your body with warm water from the neck down.  8.  DO NOT shower/wash with your normal soap after using and rinsing off  the CHG  Soap.                9.  Pat yourself dry with a clean towel.            10.  Wear clean pajamas.            11.  Place clean sheets on your bed the night of your first shower and do not  sleep with pets. Day of Surgery : Do not apply any lotions/deodorants the morning of surgery.  Please wear clean clothes to the hospital/surgery center.  FAILURE TO FOLLOW THESE INSTRUCTIONS MAY RESULT IN THE CANCELLATION OF YOUR SURGERY PATIENT SIGNATURE_________________________________  NURSE SIGNATURE__________________________________  ________________________________________________________________________

## 2021-12-24 NOTE — Progress Notes (Signed)
Sent message, via epic in basket, requesting order in epic from surgeon     12/24/21 1652  Preop Orders  Has preop orders? No  Name of staff/physician contacted for orders(Indicate phone or IB message) M. Hassell Done, MD.

## 2021-12-31 ENCOUNTER — Encounter (HOSPITAL_COMMUNITY)
Admission: RE | Admit: 2021-12-31 | Discharge: 2021-12-31 | Disposition: A | Discharge status: Home / Self Care | Payer: PPO | Source: Ambulatory Visit | Attending: Surgery | Admitting: Surgery

## 2021-12-31 ENCOUNTER — Encounter (HOSPITAL_COMMUNITY): Payer: Self-pay

## 2021-12-31 ENCOUNTER — Other Ambulatory Visit: Payer: Self-pay

## 2021-12-31 VITALS — BP 147/73 | HR 55 | Temp 98.6°F | Resp 16 | Ht 63.0 in | Wt 233.0 lb

## 2021-12-31 DIAGNOSIS — Z01818 Encounter for other preprocedural examination: Secondary | ICD-10-CM | POA: Insufficient documentation

## 2021-12-31 HISTORY — DX: Nausea with vomiting, unspecified: R11.2

## 2021-12-31 HISTORY — DX: Inflammatory liver disease, unspecified: K75.9

## 2021-12-31 HISTORY — DX: Other complications of anesthesia, initial encounter: T88.59XA

## 2021-12-31 HISTORY — DX: Anxiety disorder, unspecified: F41.9

## 2021-12-31 HISTORY — DX: Gastro-esophageal reflux disease without esophagitis: K21.9

## 2021-12-31 HISTORY — DX: Depression, unspecified: F32.A

## 2021-12-31 HISTORY — DX: Sleep apnea, unspecified: G47.30

## 2021-12-31 HISTORY — DX: Malignant (primary) neoplasm, unspecified: C80.1

## 2021-12-31 HISTORY — DX: Other specified postprocedural states: Z98.890

## 2021-12-31 HISTORY — DX: Personal history of urinary calculi: Z87.442

## 2021-12-31 LAB — CBC
HCT: 43.9 % (ref 36.0–46.0)
Hemoglobin: 14.5 g/dL (ref 12.0–15.0)
MCH: 31.7 pg (ref 26.0–34.0)
MCHC: 33 g/dL (ref 30.0–36.0)
MCV: 95.9 fL (ref 80.0–100.0)
Platelets: 218 10*3/uL (ref 150–400)
RBC: 4.58 MIL/uL (ref 3.87–5.11)
RDW: 14.5 % (ref 11.5–15.5)
WBC: 5.4 10*3/uL (ref 4.0–10.5)
nRBC: 0 % (ref 0.0–0.2)

## 2021-12-31 LAB — BASIC METABOLIC PANEL
Anion gap: 8 (ref 5–15)
BUN: 25 mg/dL — ABNORMAL HIGH (ref 8–23)
CO2: 24 mmol/L (ref 22–32)
Calcium: 9 mg/dL (ref 8.9–10.3)
Chloride: 106 mmol/L (ref 98–111)
Creatinine, Ser: 0.72 mg/dL (ref 0.44–1.00)
GFR, Estimated: >60 mL/min (ref >60)
Glucose, Bld: 106 mg/dL — ABNORMAL HIGH (ref 70–99)
Potassium: 3.8 mmol/L (ref 3.5–5.1)
Sodium: 138 mmol/L (ref 135–145)

## 2022-01-06 NOTE — Progress Notes (Signed)
Spoke with Maria Wang in Triage at [facility] and informed her that DR Hassell Done still has not put in preop orders for pt.  Maria Wang stated DR Hassell Done was in office today and she would inform him to put in orders.  Informed Maria Wang that preop nurse had seen in epic where pt had called in inquiring about Ensure preop drinks.  Maria Wang states that from office pt has bowel prep instrucitons sheet and on that it is listed for pt to be on a clear liquid diet the day of the bowel prep.  Maria Wang stated she had not been able to review bowel prep instructions with pt .  Informed Maria Wang that preop nurse would call pt and make sure she had bowel prep instruction sheet and listed on there was for pt to be on a clear liquid diet the day before surgery.   Preop nurse called pt and spoke with pt and PT stated she did have bowel prep instruction sheet and it states for her to be on a clear liquid diet the day before surgery.  Allowed pt to ask questions PT stated she had no questions except when is she supposed to dirnk preop insure the morning of surgery because her instructions state the preop nurse would inform her when to drink preop Ensure am of surgery.  PT verified with preop nurse that she had a total of 4 PreSurgery clear Ensure drinks that she had ordered from [employer]. PT states that on preop bowel sheet it states for her to dirnk 2 at 1000pm the nite before surgery and one am of surgery.  Pt instructed by preop nurse to have am Ensure presurgery ensure drinnk completed by 0430am morning of surgery.  PT voiced understanding.  Reviewed with pt Clear liquid diet on the day of bowel prep, 2 ensure presurgery drinks at 1000pm the nite before surgery and one ensure presurgery ensure drink am of surgery done by 0430am.  Clear liquids may continue until 0430am mornin of surgery.  PT voiced understanding.

## 2022-01-07 NOTE — H&P (Signed)
REFERRING PHYSICIAN:  Medoff, Tawni Pummel, MD   PROVIDER:  Joya San, MD   MRN: [id number] DOB: 03-29-1951 DATE OF ENCOUNTER: 11/18/2021   Subjective    Chief Complaint: New Consultation (Jejunal mass)       History of Present Illness: Maria Wang is a 71 y.o. female who is seen today as an office consultation at the request of Dr. Watt Climes for evaluation of New Consultation (Jejunal mass) .     Had spoken to [facility] about this.  I reviewed her CT images then and now in her old chart to figure out where this liposarcoma was because initially she was unclear about this.  This is in her right breast chest wall area.   2 weeks prior to having this abdominal pain that we are seeing her about she had severe uncontrollable diarrhea that lasted about 36 hours.  This was accompanied with really cramping pains and watery diarrhea that was hard to control.  2 weeks later she began having a bandlike abdominal pain across the anterior abdominal wall that now seems to be a little bit more on the right than the left.  When she had her CT scan done at near the end of March the pain may have been little bit to the left of midline but today its I can almost feel a mass on the right side and is tender to deep palpation.   Discussed management strategies including observation and removal.  I think that laparoscopy will enable me to mobilize and see where it is, exteriorize it and palpated and then resected it along with nodes if necessary.  That is the main reason I favor laparoscopic over robotic.           Review of Systems: See HPI as well for other ROS.   ROS      Medical History: Past Medical History      Past Medical History:  Diagnosis Date   Arthritis     History of anesthesia reaction      slow to awake, details unknown by pt.  with Novacaine "not as responsive" to Novacaine, takes longer to numb.   Hypertension     Liposarcoma (CMS-HCC)      right axilla   Obesity      Thyroid disease             Patient Active Problem List  Diagnosis   Abnormal findings on diagnostic imaging of other abdominal regions, including retroperitoneum   Body mass index (BMI) 45.0-49.9, adult (CMS-HCC)   Decreased estrogen level   Diverticulosis of small intestine   Essential hypertension   Hyperlipidemia   Hypertensive retinopathy   Jejunal ulcer   Liposarcoma (CMS-HCC)   Morbid obesity (CMS-HCC)   Obstructive sleep apnea (adult) (pediatric)   Osteopenia   Recurrent major depression in remission (CMS-HCC)      Past Surgical History       Past Surgical History:  Procedure Laterality Date   EXCISION TUMOR SOFT TISSUE NECK/ANTERIOR THORAX Right 04/14/2015    Procedure: EXCISION, TUMOR, SOFT TISSUE OF NECK OR ANTERIOR THORAX, SUBCUTANEOUS; 3CM OR GREATER;  Surgeon: Wyline Copas, MD;  Location: [facility] OR;  Service: Plastic Surgery;  Laterality: Right;   CHOLECYSTECTOMY       right knee surgery            Allergies      Allergies  Allergen Reactions   Tetanus Toxoid,Fluid Swelling   Penicillin Hives  Current Outpatient Medications on File Prior to Visit  Medication Sig Dispense Refill   buPROPion (WELLBUTRIN XL) 300 MG XL tablet Take 1 tablet by mouth every morning       dicyclomine (BENTYL) 20 mg tablet Take 20 mg by mouth every 6 (six) hours       FLUoxetine (PROZAC) 20 MG capsule Take 20 mg by mouth once daily.       GLUC SU/CHONDRO SU A/VIT C/MN (GLUCOSAMINE CHONDROITIN MAXSTR ORAL) Take by mouth once daily.       Herbal Supplement Take 2 tablets by mouth nightly. Macular degeneration supplement         Lactobacillus acidophilus Cap Take by mouth once daily as needed.       losartan (COZAAR) 100 MG tablet Take 100 mg by mouth once daily.       multivitamin tablet Take 1 tablet by mouth once daily.       OMEGA-3/DHA/EPA/FISH OIL (FISH OIL HIGH POTENCY ORAL) Take 1,200 mg by mouth once daily.       oxybutynin (DITROPAN-XL) 5 MG XL tablet  Take 5 mg by mouth nightly.          buPROPion (WELLBUTRIN XL) 300 MG XL tablet Take 300 mg by mouth once daily. (Patient not taking: Reported on 11/18/2021)       FUROsemide (LASIX) 20 MG tablet Take 20 mg by mouth once daily as needed for Edema. (Patient not taking: Reported on 11/18/2021)       GABAPENTIN ORAL Take 200 mg by mouth 3 (three) times daily. (Patient not taking: Reported on 11/18/2021)        No current facility-administered medications on file prior to visit.      Family History       Family History  Problem Relation Age of Onset   Other Father          bladder cancer   Atrial fibrillation (Abnormal heart rhythm sometimes requiring treatment with blood thinners) Mother          A Fib, ablation   Other Unknown          grandfather heart problems   Anesthesia problems Unknown          FH of not as responsive to Novacaine        Social History        Tobacco Use  Smoking Status Former   Packs/day: 2.00   Years: 10.00   Pack years: 20.00   Types: Cigarettes   Quit date: 01/20/1973   Years since quitting: 48.8  Smokeless Tobacco Never      Social History  Social History         Socioeconomic History   Marital status: Married  Tobacco Use   Smoking status: Former      Packs/day: 2.00      Years: 10.00      Pack years: 20.00      Types: Cigarettes      Quit date: 01/20/1973      Years since quitting: 48.8   Smokeless tobacco: Never        Objective:         Vitals:    11/18/21 1421  BP: 136/84  Pulse: 96  SpO2: 98%  Weight: (!) 108.3 kg (238 lb 12.8 oz)  Height: 160 cm ('5\' 3"'$ )    Body mass index is 42.3 kg/m.   Physical Exam General: Normally developed white female no acute distress HEENT  : Glasses Chest: Clear  to auscultation.  Prior surgery on the right axilla where this lipoma was removed in 2016 Heart: Sinus rhythm Breast: Not examined Abdomen: Slightly tender to the right of midline.  There is a healed large Kocher incision in the  right upper quadrant from her open cholecystectomy around 1989 in [place] not examined Rectal not performed Extremities full range of motion.  She may have had some swelling in her legs after long airplane ride.  No history of anticoagulation for DVT Neuro alert and oriented x3.  Motor and sensory function grossly intact.         Labs, Imaging and Diagnostic Testing: I reviewed her CT scan and her records including a color photos of her upper endoscopy to show the mass that was biopsied which showed reactive inflammatory cells.   Assessment and Plan:  Diagnoses and all orders for this visit:   Mass of small intestine       Think she is a good candidate for a laparoscopic directed small bowel resection.  This area has been tattooed and right now it seems to be slightly to the right of midline and is tender.  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  She also has had a son who had Crohn's disease.   She has a sex education seminar to do next week but anytime after that she is open and ready for surgery   No follow-ups on file.   Maria Ebanks Donia Pounds, MD

## 2022-01-07 NOTE — Anesthesia Preprocedure Evaluation (Addendum)
Anesthesia Evaluation  Patient identified by MRN, date of birth, ID band Patient awake    Reviewed: Allergy & Precautions, H&P , NPO status , Patient's Chart, lab work & pertinent test results  History of Anesthesia Complications (+) PONV, PROLONGED EMERGENCE and history of anesthetic complications  Airway Mallampati: II  TM Distance: >3 FB Neck ROM: Full    Dental no notable dental hx. (+) Teeth Intact, Dental Advisory Given   Pulmonary neg pulmonary ROS, sleep apnea and Continuous Positive Airway Pressure Ventilation , former smoker,    Pulmonary exam normal breath sounds clear to auscultation       Cardiovascular Exercise Tolerance: Good hypertension, Pt. on medications Normal cardiovascular exam Rhythm:Regular Rate:Normal     Neuro/Psych PSYCHIATRIC DISORDERS Anxiety Depression negative neurological ROS  negative psych ROS   GI/Hepatic GERD  Medicated and Controlled,(+) Hepatitis -Remote hepatitis    Endo/Other  negative endocrine ROS  Renal/GU negative Renal ROS  negative genitourinary   Musculoskeletal negative musculoskeletal ROS (+) Arthritis , Osteoarthritis,    Abdominal   Peds negative pediatric ROS (+)  Hematology negative hematology ROS (+)   Anesthesia Other Findings   Reproductive/Obstetrics negative OB ROS                            Anesthesia Physical Anesthesia Plan  ASA: 3  Anesthesia Plan: General   Post-op Pain Management: Tylenol PO (pre-op)*   Induction: Intravenous  PONV Risk Score and Plan: 4 or greater and Ondansetron, Dexamethasone, Treatment may vary due to age or medical condition and Propofol infusion  Airway Management Planned: Oral ETT  Additional Equipment: None  Intra-op Plan:   Post-operative Plan: Extubation in OR  Informed Consent: I have reviewed the patients History and Physical, chart, labs and discussed the procedure including the  risks, benefits and alternatives for the proposed anesthesia with the patient or authorized representative who has indicated his/her understanding and acceptance.       Plan Discussed with: Anesthesiologist and CRNA  Anesthesia Plan Comments: (  )      Anesthesia Quick Evaluation

## 2022-01-08 ENCOUNTER — Encounter (HOSPITAL_COMMUNITY)
Admission: RE | Disposition: A | Discharge status: Home / Self Care | Payer: Self-pay | Source: Home / Self Care | Attending: Surgery

## 2022-01-08 ENCOUNTER — Inpatient Hospital Stay (HOSPITAL_COMMUNITY)
Admission: RE | Admit: 2022-01-08 | Discharge: 2022-01-11 | DRG: 330 | Disposition: A | Discharge status: Home / Self Care | Payer: PPO | Attending: Surgery | Admitting: Surgery

## 2022-01-08 ENCOUNTER — Encounter (HOSPITAL_COMMUNITY): Payer: Self-pay | Admitting: Surgery

## 2022-01-08 ENCOUNTER — Other Ambulatory Visit: Payer: Self-pay

## 2022-01-08 ENCOUNTER — Ambulatory Visit (HOSPITAL_COMMUNITY): Payer: PPO | Admitting: Physician Assistant

## 2022-01-08 ENCOUNTER — Ambulatory Visit (HOSPITAL_COMMUNITY): Payer: PPO | Admitting: Anesthesiology

## 2022-01-08 DIAGNOSIS — Z87891 Personal history of nicotine dependence: Secondary | ICD-10-CM

## 2022-01-08 DIAGNOSIS — G4733 Obstructive sleep apnea (adult) (pediatric): Secondary | ICD-10-CM

## 2022-01-08 DIAGNOSIS — Z8052 Family history of malignant neoplasm of bladder: Secondary | ICD-10-CM

## 2022-01-08 DIAGNOSIS — Z79899 Other long term (current) drug therapy: Secondary | ICD-10-CM

## 2022-01-08 DIAGNOSIS — K660 Peritoneal adhesions (postprocedural) (postinfection): Secondary | ICD-10-CM | POA: Diagnosis not present

## 2022-01-08 DIAGNOSIS — I10 Essential (primary) hypertension: Secondary | ICD-10-CM | POA: Diagnosis not present

## 2022-01-08 DIAGNOSIS — K6389 Other specified diseases of intestine: Secondary | ICD-10-CM | POA: Diagnosis not present

## 2022-01-08 DIAGNOSIS — F3341 Major depressive disorder, recurrent, in partial remission: Secondary | ICD-10-CM | POA: Diagnosis present

## 2022-01-08 DIAGNOSIS — Z01818 Encounter for other preprocedural examination: Secondary | ICD-10-CM

## 2022-01-08 DIAGNOSIS — E785 Hyperlipidemia, unspecified: Secondary | ICD-10-CM | POA: Diagnosis not present

## 2022-01-08 DIAGNOSIS — M6281 Muscle weakness (generalized): Secondary | ICD-10-CM | POA: Diagnosis not present

## 2022-01-08 DIAGNOSIS — F3340 Major depressive disorder, recurrent, in remission, unspecified: Secondary | ICD-10-CM | POA: Diagnosis present

## 2022-01-08 DIAGNOSIS — H35039 Hypertensive retinopathy, unspecified eye: Secondary | ICD-10-CM | POA: Diagnosis not present

## 2022-01-08 DIAGNOSIS — K5712 Diverticulitis of small intestine without perforation or abscess without bleeding: Secondary | ICD-10-CM | POA: Diagnosis not present

## 2022-01-08 DIAGNOSIS — K219 Gastro-esophageal reflux disease without esophagitis: Secondary | ICD-10-CM | POA: Diagnosis present

## 2022-01-08 DIAGNOSIS — K639 Disease of intestine, unspecified: Secondary | ICD-10-CM | POA: Diagnosis not present

## 2022-01-08 DIAGNOSIS — M8580 Other specified disorders of bone density and structure, unspecified site: Secondary | ICD-10-CM | POA: Diagnosis present

## 2022-01-08 DIAGNOSIS — M1990 Unspecified osteoarthritis, unspecified site: Secondary | ICD-10-CM | POA: Diagnosis not present

## 2022-01-08 DIAGNOSIS — Z6841 Body Mass Index (BMI) 40.0 and over, adult: Secondary | ICD-10-CM

## 2022-01-08 DIAGNOSIS — K5710 Diverticulosis of small intestine without perforation or abscess without bleeding: Secondary | ICD-10-CM

## 2022-01-08 DIAGNOSIS — K289 Gastrojejunal ulcer, unspecified as acute or chronic, without hemorrhage or perforation: Secondary | ICD-10-CM | POA: Diagnosis present

## 2022-01-08 DIAGNOSIS — F418 Other specified anxiety disorders: Secondary | ICD-10-CM | POA: Diagnosis not present

## 2022-01-08 DIAGNOSIS — I888 Other nonspecific lymphadenitis: Secondary | ICD-10-CM

## 2022-01-08 HISTORY — PX: LAPAROSCOPIC SMALL BOWEL RESECTION: SHX5929

## 2022-01-08 LAB — CBC
HCT: 43.4 % (ref 36.0–46.0)
Hemoglobin: 13.9 g/dL (ref 12.0–15.0)
MCH: 31 pg (ref 26.0–34.0)
MCHC: 32 g/dL (ref 30.0–36.0)
MCV: 96.7 fL (ref 80.0–100.0)
Platelets: 211 10*3/uL (ref 150–400)
RBC: 4.49 MIL/uL (ref 3.87–5.11)
RDW: 14.6 % (ref 11.5–15.5)
WBC: 11 10*3/uL — ABNORMAL HIGH (ref 4.0–10.5)
nRBC: 0 % (ref 0.0–0.2)

## 2022-01-08 LAB — CREATININE, SERUM
Creatinine, Ser: 0.81 mg/dL (ref 0.44–1.00)
GFR, Estimated: >60 mL/min (ref >60)

## 2022-01-08 LAB — GLUCOSE, CAPILLARY: Glucose-Capillary: 160 mg/dL — ABNORMAL HIGH (ref 70–99)

## 2022-01-08 LAB — ABO/RH: ABO/RH(D): AB POS

## 2022-01-08 LAB — TYPE AND SCREEN
ABO/RH(D): AB POS
Antibody Screen: NEGATIVE

## 2022-01-08 SURGERY — EXCISION, SMALL INTESTINE, LAPAROSCOPIC
Anesthesia: General

## 2022-01-08 MED ORDER — BUPIVACAINE-EPINEPHRINE (PF) 0.25% -1:200000 IJ SOLN
INTRAMUSCULAR | Status: AC
  Filled 2022-01-08: qty 30

## 2022-01-08 MED ORDER — LIDOCAINE 2% (20 MG/ML) 5 ML SYRINGE
INTRAMUSCULAR | Status: DC | PRN
  Administered 2022-01-08: 100 mg via INTRAVENOUS

## 2022-01-08 MED ORDER — SUGAMMADEX SODIUM 500 MG/5ML IV SOLN
INTRAVENOUS | Status: AC
  Filled 2022-01-08: qty 5

## 2022-01-08 MED ORDER — ONDANSETRON HCL 4 MG/2ML IJ SOLN
INTRAMUSCULAR | Status: AC
  Filled 2022-01-08: qty 2

## 2022-01-08 MED ORDER — KETAMINE HCL 10 MG/ML IJ SOLN
INTRAMUSCULAR | Status: DC | PRN
  Administered 2022-01-08: 25 mg via INTRAVENOUS

## 2022-01-08 MED ORDER — SCOPOLAMINE 1 MG/3DAYS TD PT72
1.0000 | MEDICATED_PATCH | TRANSDERMAL | Status: DC
  Administered 2022-01-08: 1.5 mg via TRANSDERMAL
  Filled 2022-01-08: qty 1

## 2022-01-08 MED ORDER — MORPHINE SULFATE (PF) 2 MG/ML IV SOLN
1.0000 mg | INTRAVENOUS | Status: DC | PRN
  Administered 2022-01-08: 2 mg via INTRAVENOUS
  Filled 2022-01-08: qty 1

## 2022-01-08 MED ORDER — CHLORHEXIDINE GLUCONATE 0.12 % MT SOLN
15.0000 mL | Freq: Once | OROMUCOSAL | Status: AC
  Administered 2022-01-08: 15 mL via OROMUCOSAL

## 2022-01-08 MED ORDER — PROCHLORPERAZINE EDISYLATE 10 MG/2ML IJ SOLN: 5.0000 mg | Freq: Four times a day (QID) | INTRAMUSCULAR | Status: DC | PRN

## 2022-01-08 MED ORDER — PHENYLEPHRINE HCL (PRESSORS) 10 MG/ML IV SOLN
INTRAVENOUS | Status: AC
  Filled 2022-01-08: qty 1

## 2022-01-08 MED ORDER — ONDANSETRON 4 MG PO TBDP: 4.0000 mg | ORAL_TABLET | Freq: Four times a day (QID) | ORAL | Status: DC | PRN

## 2022-01-08 MED ORDER — OXYCODONE HCL 5 MG/5ML PO SOLN: 5.0000 mg | Freq: Once | ORAL | Status: DC | PRN

## 2022-01-08 MED ORDER — ONDANSETRON HCL 4 MG/2ML IJ SOLN
INTRAMUSCULAR | Status: DC | PRN
  Administered 2022-01-08: 4 mg via INTRAVENOUS

## 2022-01-08 MED ORDER — CHLORHEXIDINE GLUCONATE CLOTH 2 % EX PADS: 6.0000 | MEDICATED_PAD | Freq: Once | CUTANEOUS | Status: DC

## 2022-01-08 MED ORDER — ROCURONIUM BROMIDE 10 MG/ML (PF) SYRINGE
PREFILLED_SYRINGE | INTRAVENOUS | Status: AC
  Filled 2022-01-08: qty 10

## 2022-01-08 MED ORDER — EPHEDRINE 5 MG/ML INJ
INTRAVENOUS | Status: AC
  Filled 2022-01-08: qty 5

## 2022-01-08 MED ORDER — BUPIVACAINE LIPOSOME 1.3 % IJ SUSP
INTRAMUSCULAR | Status: DC | PRN
  Administered 2022-01-08: 20 mL

## 2022-01-08 MED ORDER — LIDOCAINE HCL (PF) 2 % IJ SOLN
INTRAMUSCULAR | Status: AC
  Filled 2022-01-08: qty 5

## 2022-01-08 MED ORDER — ORAL CARE MOUTH RINSE
15.0000 mL | Freq: Once | OROMUCOSAL | Status: AC
Start: 2022-01-08 — End: 2022-01-08

## 2022-01-08 MED ORDER — OXYCODONE HCL 5 MG PO TABS: 5.0000 mg | ORAL_TABLET | Freq: Once | ORAL | Status: DC | PRN

## 2022-01-08 MED ORDER — BUPIVACAINE LIPOSOME 1.3 % IJ SUSP
INTRAMUSCULAR | Status: AC
  Filled 2022-01-08: qty 20

## 2022-01-08 MED ORDER — ACETAMINOPHEN 500 MG PO TABS: 1000.0000 mg | ORAL_TABLET | Freq: Once | ORAL | Status: DC

## 2022-01-08 MED ORDER — PHENYLEPHRINE HCL-NACL 20-0.9 MG/250ML-% IV SOLN
INTRAVENOUS | Status: DC | PRN
  Administered 2022-01-08: 50 ug/min via INTRAVENOUS

## 2022-01-08 MED ORDER — MEPERIDINE HCL 50 MG/ML IJ SOLN: 6.2500 mg | INTRAMUSCULAR | Status: DC | PRN

## 2022-01-08 MED ORDER — FENTANYL CITRATE (PF) 250 MCG/5ML IJ SOLN
INTRAMUSCULAR | Status: DC | PRN
  Administered 2022-01-08: 100 ug via INTRAVENOUS
  Administered 2022-01-08: 50 ug via INTRAVENOUS

## 2022-01-08 MED ORDER — MIDAZOLAM HCL 2 MG/2ML IJ SOLN
INTRAMUSCULAR | Status: AC
  Filled 2022-01-08: qty 2

## 2022-01-08 MED ORDER — LACTATED RINGERS IV SOLN: INTRAVENOUS | Status: DC

## 2022-01-08 MED ORDER — FENTANYL CITRATE PF 50 MCG/ML IJ SOSY
25.0000 ug | PREFILLED_SYRINGE | INTRAMUSCULAR | Status: DC | PRN
  Administered 2022-01-08 (×3): 50 ug via INTRAVENOUS

## 2022-01-08 MED ORDER — SODIUM CHLORIDE (PF) 0.9 % IJ SOLN
INTRAMUSCULAR | Status: AC
  Filled 2022-01-08: qty 10

## 2022-01-08 MED ORDER — ACETAMINOPHEN 325 MG PO TABS
325.0000 mg | ORAL_TABLET | Freq: Four times a day (QID) | ORAL | Status: DC
  Administered 2022-01-08: 325 mg via ORAL
  Filled 2022-01-08 (×2): qty 1

## 2022-01-08 MED ORDER — BUPIVACAINE LIPOSOME 1.3 % IJ SUSP: 20.0000 mL | Freq: Once | INTRAMUSCULAR | Status: DC

## 2022-01-08 MED ORDER — KETAMINE HCL 50 MG/5ML IJ SOSY
PREFILLED_SYRINGE | INTRAMUSCULAR | Status: AC
  Filled 2022-01-08: qty 5

## 2022-01-08 MED ORDER — ACETAMINOPHEN 500 MG PO TABS
1000.0000 mg | ORAL_TABLET | ORAL | Status: AC
  Administered 2022-01-08: 1000 mg via ORAL
  Filled 2022-01-08: qty 2

## 2022-01-08 MED ORDER — PROPOFOL 10 MG/ML IV BOLUS
INTRAVENOUS | Status: AC
  Filled 2022-01-08: qty 20

## 2022-01-08 MED ORDER — PROCHLORPERAZINE MALEATE 10 MG PO TABS: 10.0000 mg | ORAL_TABLET | Freq: Four times a day (QID) | ORAL | Status: DC | PRN

## 2022-01-08 MED ORDER — FENTANYL CITRATE PF 50 MCG/ML IJ SOSY
PREFILLED_SYRINGE | INTRAMUSCULAR | Status: AC
  Filled 2022-01-08: qty 3

## 2022-01-08 MED ORDER — PANTOPRAZOLE SODIUM 40 MG IV SOLR
40.0000 mg | Freq: Every day | INTRAVENOUS | Status: DC
  Administered 2022-01-08: 40 mg via INTRAVENOUS
  Filled 2022-01-08: qty 10

## 2022-01-08 MED ORDER — DEXAMETHASONE SODIUM PHOSPHATE 10 MG/ML IJ SOLN
INTRAMUSCULAR | Status: AC
  Filled 2022-01-08: qty 1

## 2022-01-08 MED ORDER — DEXAMETHASONE SODIUM PHOSPHATE 10 MG/ML IJ SOLN
INTRAMUSCULAR | Status: DC | PRN
  Administered 2022-01-08: 5 mg via INTRAVENOUS

## 2022-01-08 MED ORDER — SODIUM CHLORIDE (PF) 0.9 % IJ SOLN
INTRAMUSCULAR | Status: DC | PRN
  Administered 2022-01-08: 10 mL via INTRAVENOUS

## 2022-01-08 MED ORDER — LIDOCAINE 20MG/ML (2%) 15 ML SYRINGE OPTIME
INTRAMUSCULAR | Status: DC | PRN
  Administered 2022-01-08: 1.5 mg/kg/h via INTRAVENOUS

## 2022-01-08 MED ORDER — ONDANSETRON HCL 4 MG/2ML IJ SOLN: 4.0000 mg | Freq: Once | INTRAMUSCULAR | Status: DC | PRN

## 2022-01-08 MED ORDER — HEPARIN SODIUM (PORCINE) 5000 UNIT/ML IJ SOLN
5000.0000 [IU] | Freq: Three times a day (TID) | INTRAMUSCULAR | Status: DC
  Administered 2022-01-08 – 2022-01-11 (×10): 5000 [IU] via SUBCUTANEOUS
  Filled 2022-01-08 (×10): qty 1

## 2022-01-08 MED ORDER — MIDAZOLAM HCL 2 MG/2ML IJ SOLN
INTRAMUSCULAR | Status: DC | PRN
  Administered 2022-01-08: 2 mg via INTRAVENOUS

## 2022-01-08 MED ORDER — HYDRALAZINE HCL 20 MG/ML IJ SOLN: 10.0000 mg | INTRAMUSCULAR | Status: DC | PRN

## 2022-01-08 MED ORDER — SUGAMMADEX SODIUM 500 MG/5ML IV SOLN
INTRAVENOUS | Status: DC | PRN
  Administered 2022-01-08: 400 mg via INTRAVENOUS

## 2022-01-08 MED ORDER — CEFAZOLIN SODIUM-DEXTROSE 2-4 GM/100ML-% IV SOLN
2.0000 g | Freq: Three times a day (TID) | INTRAVENOUS | Status: AC
  Administered 2022-01-08: 2 g via INTRAVENOUS
  Filled 2022-01-08: qty 100

## 2022-01-08 MED ORDER — KCL IN DEXTROSE-NACL 20-5-0.45 MEQ/L-%-% IV SOLN
INTRAVENOUS | Status: DC
  Filled 2022-01-08 (×2): qty 1000

## 2022-01-08 MED ORDER — ACETAMINOPHEN 160 MG/5ML PO SOLN: 325.0000 mg | ORAL | Status: DC | PRN

## 2022-01-08 MED ORDER — METOPROLOL TARTRATE 5 MG/5ML IV SOLN: 5.0000 mg | Freq: Four times a day (QID) | INTRAVENOUS | Status: DC | PRN

## 2022-01-08 MED ORDER — PROPOFOL 10 MG/ML IV BOLUS
INTRAVENOUS | Status: DC | PRN
  Administered 2022-01-08: 120 mg via INTRAVENOUS

## 2022-01-08 MED ORDER — ACETAMINOPHEN 325 MG PO TABS: 325.0000 mg | ORAL_TABLET | ORAL | Status: DC | PRN

## 2022-01-08 MED ORDER — LIDOCAINE HCL 2 % IJ SOLN
INTRAMUSCULAR | Status: AC
  Filled 2022-01-08: qty 20

## 2022-01-08 MED ORDER — PHENYLEPHRINE 80 MCG/ML (10ML) SYRINGE FOR IV PUSH (FOR BLOOD PRESSURE SUPPORT)
PREFILLED_SYRINGE | INTRAVENOUS | Status: DC | PRN
  Administered 2022-01-08: 160 ug via INTRAVENOUS
  Administered 2022-01-08: 80 ug via INTRAVENOUS
  Administered 2022-01-08: 160 ug via INTRAVENOUS
  Administered 2022-01-08: 80 ug via INTRAVENOUS

## 2022-01-08 MED ORDER — ROCURONIUM BROMIDE 10 MG/ML (PF) SYRINGE
PREFILLED_SYRINGE | INTRAVENOUS | Status: DC | PRN
  Administered 2022-01-08: 20 mg via INTRAVENOUS
  Administered 2022-01-08: 10 mg via INTRAVENOUS
  Administered 2022-01-08: 100 mg via INTRAVENOUS

## 2022-01-08 MED ORDER — EPHEDRINE SULFATE-NACL 50-0.9 MG/10ML-% IV SOSY
PREFILLED_SYRINGE | INTRAVENOUS | Status: DC | PRN
  Administered 2022-01-08 (×3): 5 mg via INTRAVENOUS
  Administered 2022-01-08: 10 mg via INTRAVENOUS

## 2022-01-08 MED ORDER — ONDANSETRON HCL 4 MG/2ML IJ SOLN: 4.0000 mg | Freq: Four times a day (QID) | INTRAMUSCULAR | Status: DC | PRN

## 2022-01-08 MED ORDER — CEFAZOLIN SODIUM-DEXTROSE 2-4 GM/100ML-% IV SOLN
2.0000 g | INTRAVENOUS | Status: AC
  Administered 2022-01-08: 2 g via INTRAVENOUS
  Filled 2022-01-08: qty 100

## 2022-01-08 MED ORDER — HYDROCODONE-ACETAMINOPHEN 5-325 MG PO TABS
1.0000 | ORAL_TABLET | ORAL | Status: DC | PRN
  Administered 2022-01-08: 1 via ORAL
  Administered 2022-01-09: 2 via ORAL
  Filled 2022-01-08: qty 2
  Filled 2022-01-08: qty 1

## 2022-01-08 MED ORDER — LACTATED RINGERS IV SOLN
INTRAVENOUS | Status: DC | PRN
  Administered 2022-01-08: 1000 mL via INTRAVENOUS

## 2022-01-08 MED ORDER — FENTANYL CITRATE (PF) 250 MCG/5ML IJ SOLN
INTRAMUSCULAR | Status: AC
  Filled 2022-01-08: qty 5

## 2022-01-08 SURGICAL SUPPLY — 70 items
ADH SKN CLS APL DERMABOND .7 (GAUZE/BANDAGES/DRESSINGS) ×1
APL PRP STRL LF DISP 70% ISPRP (MISCELLANEOUS) ×1
BAG COUNTER SPONGE SURGICOUNT (BAG) ×1 IMPLANT
BAG SPNG CNTER NS LX DISP (BAG) ×1
BLADE EXTENDED COATED 6.5IN (ELECTRODE) IMPLANT
CABLE HIGH FREQUENCY MONO STRZ (ELECTRODE) ×3 IMPLANT
CELLS DAT CNTRL 66122 CELL SVR (MISCELLANEOUS) ×1 IMPLANT
CHLORAPREP W/TINT 26 (MISCELLANEOUS) ×1 IMPLANT
COUNTER NEEDLE 20 DBL MAG RED (NEEDLE) ×3 IMPLANT
COVER MAYO STAND STRL (DRAPES) ×9 IMPLANT
COVER SURGICAL LIGHT HANDLE (MISCELLANEOUS) ×3 IMPLANT
DERMABOND ADVANCED (GAUZE/BANDAGES/DRESSINGS) ×1
DERMABOND ADVANCED .7 DNX12 (GAUZE/BANDAGES/DRESSINGS) IMPLANT
DRAIN CHANNEL 19F RND (DRAIN) IMPLANT
DRAPE LAPAROSCOPIC ABDOMINAL (DRAPES) ×3 IMPLANT
DRSG OPSITE POSTOP 4X10 (GAUZE/BANDAGES/DRESSINGS) IMPLANT
DRSG OPSITE POSTOP 4X6 (GAUZE/BANDAGES/DRESSINGS) IMPLANT
DRSG OPSITE POSTOP 4X8 (GAUZE/BANDAGES/DRESSINGS) IMPLANT
ELECT REM PT RETURN 15FT ADLT (MISCELLANEOUS) ×3 IMPLANT
EVACUATOR SILICONE 100CC (DRAIN) IMPLANT
GAUZE SPONGE 4X4 12PLY STRL (GAUZE/BANDAGES/DRESSINGS) IMPLANT
GLOVE SURG LX 8.0 MICRO (GLOVE) ×2
GLOVE SURG LX STRL 8.0 MICRO (GLOVE) ×4 IMPLANT
GOWN STRL REUS W/ TWL XL LVL3 (GOWN DISPOSABLE) ×8 IMPLANT
GOWN STRL REUS W/TWL XL LVL3 (GOWN DISPOSABLE) ×8
HANDLE STAPLE EGIA 4 XL (STAPLE) ×1 IMPLANT
IRRIG SUCT STRYKERFLOW 2 WTIP (MISCELLANEOUS) ×2
IRRIGATION SUCT STRKRFLW 2 WTP (MISCELLANEOUS) ×2 IMPLANT
KIT TURNOVER KIT A (KITS) ×1 IMPLANT
LEGGING LITHOTOMY PAIR STRL (DRAPES) IMPLANT
LIGASURE IMPACT 36 18CM CVD LR (INSTRUMENTS) ×1 IMPLANT
PACK COLON (CUSTOM PROCEDURE TRAY) ×3 IMPLANT
RELOAD EGIA 60 TAN VASC (STAPLE) ×3 IMPLANT
RELOAD STAPLE 45 PURP MED/THCK (STAPLE) IMPLANT
RELOAD TRI 45 ART MED THCK BLK (STAPLE) IMPLANT
RELOAD TRI 45 ART MED THCK PUR (STAPLE) IMPLANT
RELOAD TRI 60 ART MED THCK BLK (STAPLE) IMPLANT
RELOAD TRI 60 ART MED THCK PUR (STAPLE) IMPLANT
RETRACTOR WND ALEXIS 18 MED (MISCELLANEOUS) IMPLANT
RTRCTR WOUND ALEXIS 18CM MED (MISCELLANEOUS) ×2
SCISSORS LAP 5X45 EPIX DISP (ENDOMECHANICALS) ×3 IMPLANT
SET TUBE SMOKE EVAC HIGH FLOW (TUBING) ×3 IMPLANT
SHEARS HARMONIC ACE PLUS 45CM (MISCELLANEOUS) IMPLANT
SLEEVE ADV FIXATION 5X100MM (TROCAR) ×3 IMPLANT
SPIKE FLUID TRANSFER (MISCELLANEOUS) IMPLANT
SPONGE DRAIN TRACH 4X4 STRL 2S (GAUZE/BANDAGES/DRESSINGS) IMPLANT
STAPLER VISISTAT 35W (STAPLE) IMPLANT
SUT ETHILON 3 0 PS 1 (SUTURE) IMPLANT
SUT MNCRL AB 4-0 PS2 18 (SUTURE) IMPLANT
SUT NOVA 1 T20/GS 25DT (SUTURE) IMPLANT
SUT PDS AB 1 TP1 96 (SUTURE) IMPLANT
SUT PDS AB 4-0 SH 27 (SUTURE) ×2 IMPLANT
SUT SILK 2 0 (SUTURE) ×2
SUT SILK 2 0 SH CR/8 (SUTURE) ×3 IMPLANT
SUT SILK 2-0 18XBRD TIE 12 (SUTURE) ×2 IMPLANT
SUT SILK 3 0 (SUTURE) ×2
SUT SILK 3 0 SH CR/8 (SUTURE) ×4 IMPLANT
SUT SILK 3-0 18XBRD TIE 12 (SUTURE) ×2 IMPLANT
SUT VIC AB 3-0 SH 18 (SUTURE) ×2 IMPLANT
SYR 10ML ECCENTRIC (SYRINGE) ×3 IMPLANT
SYS WOUND ALEXIS 18CM MED (MISCELLANEOUS) ×2
SYSTEM WOUND ALEXIS 18CM MED (MISCELLANEOUS) IMPLANT
TOWEL OR 17X26 10 PK STRL BLUE (TOWEL DISPOSABLE) ×1 IMPLANT
TOWEL OR NON WOVEN STRL DISP B (DISPOSABLE) IMPLANT
TRAY FOLEY MTR SLVR 16FR STAT (SET/KITS/TRAYS/PACK) IMPLANT
TROCAR ADV FIXATION 5X100MM (TROCAR) ×3 IMPLANT
TROCAR KII 12X100 BLADELESS (ENDOMECHANICALS) IMPLANT
TROCAR Z-THREAD FIOS 12X100MM (TROCAR) ×1 IMPLANT
TROCAR Z-THREAD OPTICAL 5X100M (TROCAR) ×3 IMPLANT
YANKAUER SUCT BULB TIP 10FT TU (MISCELLANEOUS) ×1 IMPLANT

## 2022-01-08 NOTE — Interval H&P Note (Signed)
History and Physical Interval Note:  01/08/2022 7:34 AM  Maria Wang  has presented today for surgery, with the diagnosis of MASS JEJUNUM.  The various methods of treatment have been discussed with the patient and family. After consideration of risks, benefits and other options for treatment, the patient has consented to  Procedure(s): LAPAROSCOPIC SMALL BOWEL RESECTION (N/A) as a surgical intervention.  The patient's history has been reviewed, patient examined, no change in status, stable for surgery.  I have reviewed the patient's chart and labs.  Questions were answered to the patient's satisfaction.     Maria Wang

## 2022-01-08 NOTE — Anesthesia Procedure Notes (Signed)
Procedure Name: Intubation Date/Time: 01/08/2022 7:47 AM  Performed by: Lollie Sails, CRNAPre-anesthesia Checklist: Patient identified, Emergency Drugs available, Suction available, Patient being monitored and Timeout performed Patient Re-evaluated:Patient Re-evaluated prior to induction Oxygen Delivery Method: Circle system utilized Preoxygenation: Pre-oxygenation with 100% oxygen Induction Type: IV induction Ventilation: Mask ventilation without difficulty Laryngoscope Size: Miller and 3 Grade View: Grade I Tube type: Oral Tube size: 7.5 mm Number of attempts: 1 Airway Equipment and Method: Stylet Placement Confirmation: ETT inserted through vocal cords under direct vision, positive ETCO2 and breath sounds checked- equal and bilateral Secured at: 23 cm Tube secured with: Tape Dental Injury: Teeth and Oropharynx as per pre-operative assessment

## 2022-01-08 NOTE — Op Note (Addendum)
[facility]erris [facility]  07-Jun-1951   01/08/2022    PCP:  [facility]   Surgeon: Kaylyn Lim, MD, FACS  Asst:  Radonna Ricker, MD  Anes:  general  Preop Dx: Mass in jejunum Postop Dx: Marked with Niger ink small bowel with large diverticula; no palpable mass and shotty mesenteric nodes  Procedure: Lap assised jejunal resection with primary anastomosis Location Surgery: [facility] 4 Complications:  None noted  EBL:   minimal cc  Drains: none  Description of Procedure:  The patient was taken to OR 4 .  After anesthesia was administered and the patient was prepped  with chloroprep  and a timeout was performed.  Access to the abdomen was achieved with a 5 mm Optiview through the left upper quadrant.  A second port was placed on the right side and through that I took down some adhesions to her old open cholecystitis the site and to the midline.  We had reviewed the CT scan and I reviewed Dr. Lucretia Kern notes and looked to the left upper quadrant and found the area that was marked with Niger ink very clearly.  I went ahead and walked backwards with Glassman clamps to the ligament of Treitz and then ran the bowel to the distal ileum and did not find any other masses.  At the side of the dye she had some very prominent small bowel diverticula and some shotty lymph nodes in the mesentery.  I ran the small bowel laparoscopically from the LT to the terminal ileum and found no neoplastic masses.     We had 3 small fives in the lower abdomen and the one in the left upper quadrant we opened a little more widely and placed a wound protector in and through that we exteriorized the pigmented bowel with Niger ink in it.  There we found these large diverticula and I went ahead and went proximally and distally to excise most of the areas we saw that contained prominent small bowel diverticula ~ 1 foot of small bowel and marked the proximal end with a suture.    We divided that proximally and distally  with the [employer] GIA 6 cm tan load cartridge.  We went through the mesentery with the LigaSure device and as this was exteriorized we are able to then put it together as a functional end-to-end side-to-side anastomosis creating a 6 cm tan staple load along the antimesenteric borders.  The common defect was closed in 2 layers with 4-0 PDS in interrupted Lembert's of 3 oh silks.  Mesentery was approximated with some silks as well.  The the complete anastomosis looked good.  A crotch stitch had been placed and then this was returned into the abdomen.  We did visualize this with the scope prior to closing that defect.  The peritoneum was closed with a with a simple layer of running 2-0 Vicryl.  The fascia was closed with interrupted #1 Novafil's.  The wound was irrigated and then closed with 4-0 Monocryl including the 3 trocar sites as well as a transverse it.  Prior to closure we went ahead and did blocks with 10 cc of of diluted Exparel in the right upper quadrant and the left upper quadrant and then infused the last 10 cc into the open incision.  Patient seemed to tolerate the procedure well was taken to the recovery room.   The patient tolerated the procedure well and was taken to the PACU in stable condition.  Matt B. Hassell Done, [place], [facility], [place]

## 2022-01-08 NOTE — Anesthesia Postprocedure Evaluation (Signed)
Anesthesia Post Note  Patient: Maria Wang  Procedure(s) Performed: LAPAROSCOPIC SMALL BOWEL RESECTION     Patient location during evaluation: PACU Anesthesia Type: General Level of consciousness: awake and alert Pain management: pain level controlled Vital Signs Assessment: post-procedure vital signs reviewed and stable Respiratory status: spontaneous breathing, nonlabored ventilation, respiratory function stable and patient connected to nasal cannula oxygen Cardiovascular status: blood pressure returned to baseline and stable Postop Assessment: no apparent nausea or vomiting Anesthetic complications: no   No notable events documented.  Last Vitals:  Vitals:   01/08/22 1230 01/08/22 1300  BP: 120/65 (!) 128/59  Pulse: 63 70  Resp: 17 17  Temp:    SpO2: 96% 94%    Last Pain:  Vitals:   01/08/22 1230  TempSrc:   PainSc: Asleep                 Jessice Madill

## 2022-01-08 NOTE — Transfer of Care (Signed)
Immediate Anesthesia Transfer of Care Note  Patient: Maria Wang  Procedure(s) Performed: LAPAROSCOPIC SMALL BOWEL RESECTION  Patient Location: PACU  Anesthesia Type:General  Level of Consciousness: awake, alert , oriented and patient cooperative  Airway & Oxygen Therapy: Patient Spontanous Breathing and Patient connected to face mask oxygen  Post-op Assessment: Report given to RN and Post -op Vital signs reviewed and stable  Post vital signs: Reviewed and stable  Last Vitals:  Vitals Value Taken Time  BP 133/68 01/08/22 1007  Temp 36.4 C 01/08/22 1007  Pulse 61 01/08/22 1010  Resp 18 01/08/22 1010  SpO2 100 % 01/08/22 1010  Vitals shown include unvalidated device data.  Last Pain:  Vitals:   01/08/22 0607  TempSrc:   PainSc: 0-No pain         Complications: No notable events documented.

## 2022-01-09 ENCOUNTER — Encounter (HOSPITAL_COMMUNITY): Payer: Self-pay | Admitting: Surgery

## 2022-01-09 DIAGNOSIS — K5712 Diverticulitis of small intestine without perforation or abscess without bleeding: Principal | ICD-10-CM

## 2022-01-09 LAB — BASIC METABOLIC PANEL
Anion gap: 5 (ref 5–15)
BUN: 8 mg/dL (ref 8–23)
CO2: 28 mmol/L (ref 22–32)
Calcium: 8.9 mg/dL (ref 8.9–10.3)
Chloride: 109 mmol/L (ref 98–111)
Creatinine, Ser: 0.68 mg/dL (ref 0.44–1.00)
GFR, Estimated: >60 mL/min (ref >60)
Glucose, Bld: 120 mg/dL — ABNORMAL HIGH (ref 70–99)
Potassium: 3.7 mmol/L (ref 3.5–5.1)
Sodium: 142 mmol/L (ref 135–145)

## 2022-01-09 LAB — CBC
HCT: 42 % (ref 36.0–46.0)
Hemoglobin: 13.1 g/dL (ref 12.0–15.0)
MCH: 31.2 pg (ref 26.0–34.0)
MCHC: 31.2 g/dL (ref 30.0–36.0)
MCV: 100 fL (ref 80.0–100.0)
Platelets: 174 10*3/uL (ref 150–400)
RBC: 4.2 MIL/uL (ref 3.87–5.11)
RDW: 14.9 % (ref 11.5–15.5)
WBC: 7.6 10*3/uL (ref 4.0–10.5)
nRBC: 0 % (ref 0.0–0.2)

## 2022-01-09 MED ORDER — NAPROXEN SODIUM 275 MG PO TABS: 440.0000 mg | ORAL_TABLET | Freq: Two times a day (BID) | ORAL | Status: DC | PRN

## 2022-01-09 MED ORDER — METHOCARBAMOL 1000 MG/10ML IJ SOLN: 1000.0000 mg | Freq: Four times a day (QID) | INTRAVENOUS | Status: DC | PRN

## 2022-01-09 MED ORDER — BISACODYL 10 MG RE SUPP: 10.0000 mg | Freq: Two times a day (BID) | RECTAL | Status: DC | PRN

## 2022-01-09 MED ORDER — ADULT MULTIVITAMIN W/MINERALS CH
1.0000 | ORAL_TABLET | Freq: Every day | ORAL | Status: DC
  Administered 2022-01-09 – 2022-01-11 (×3): 1 via ORAL
  Filled 2022-01-09 (×3): qty 1

## 2022-01-09 MED ORDER — DIPHENHYDRAMINE HCL 50 MG/ML IJ SOLN: 12.5000 mg | Freq: Four times a day (QID) | INTRAMUSCULAR | Status: DC | PRN

## 2022-01-09 MED ORDER — OXYCODONE HCL 5 MG PO TABS
5.0000 mg | ORAL_TABLET | ORAL | Status: DC | PRN
  Administered 2022-01-09 – 2022-01-10 (×3): 10 mg via ORAL
  Administered 2022-01-10: 5 mg via ORAL
  Filled 2022-01-09: qty 2
  Filled 2022-01-09: qty 1
  Filled 2022-01-09 (×3): qty 2

## 2022-01-09 MED ORDER — LIP MEDEX EX OINT
TOPICAL_OINTMENT | Freq: Two times a day (BID) | CUTANEOUS | Status: DC
  Administered 2022-01-09 – 2022-01-10 (×2): 1 via TOPICAL
  Filled 2022-01-09 (×2): qty 7

## 2022-01-09 MED ORDER — CALCIUM POLYCARBOPHIL 625 MG PO TABS
625.0000 mg | ORAL_TABLET | Freq: Two times a day (BID) | ORAL | Status: DC
  Administered 2022-01-09 – 2022-01-11 (×5): 625 mg via ORAL
  Filled 2022-01-09 (×5): qty 1

## 2022-01-09 MED ORDER — LACTATED RINGERS IV BOLUS: 1000.0000 mL | Freq: Three times a day (TID) | INTRAVENOUS | Status: AC | PRN

## 2022-01-09 MED ORDER — IBUPROFEN 400 MG PO TABS: 400.0000 mg | ORAL_TABLET | Freq: Four times a day (QID) | ORAL | Status: DC | PRN

## 2022-01-09 MED ORDER — METHOCARBAMOL 500 MG PO TABS
1000.0000 mg | ORAL_TABLET | Freq: Four times a day (QID) | ORAL | Status: DC | PRN
  Administered 2022-01-09 – 2022-01-10 (×2): 1000 mg via ORAL
  Filled 2022-01-09 (×2): qty 2

## 2022-01-09 MED ORDER — BUPROPION HCL ER (XL) 300 MG PO TB24
300.0000 mg | ORAL_TABLET | Freq: Every day | ORAL | Status: DC
  Administered 2022-01-09 – 2022-01-11 (×3): 300 mg via ORAL
  Filled 2022-01-09 (×3): qty 1

## 2022-01-09 MED ORDER — FUROSEMIDE 20 MG PO TABS
20.0000 mg | ORAL_TABLET | Freq: Every day | ORAL | Status: DC
  Administered 2022-01-09 – 2022-01-11 (×3): 20 mg via ORAL
  Filled 2022-01-09 (×3): qty 1

## 2022-01-09 MED ORDER — SODIUM CHLORIDE 0.9% FLUSH
3.0000 mL | Freq: Two times a day (BID) | INTRAVENOUS | Status: DC
  Administered 2022-01-09 – 2022-01-11 (×5): 3 mL via INTRAVENOUS

## 2022-01-09 MED ORDER — ENALAPRILAT 1.25 MG/ML IV SOLN: 0.6250 mg | Freq: Four times a day (QID) | INTRAVENOUS | Status: DC | PRN

## 2022-01-09 MED ORDER — ALUM & MAG HYDROXIDE-SIMETH 200-200-20 MG/5ML PO SUSP: 30.0000 mL | Freq: Four times a day (QID) | ORAL | Status: DC | PRN

## 2022-01-09 MED ORDER — FLUOXETINE HCL 20 MG PO CAPS
40.0000 mg | ORAL_CAPSULE | Freq: Every day | ORAL | Status: DC
  Administered 2022-01-09 – 2022-01-11 (×3): 40 mg via ORAL
  Filled 2022-01-09 (×3): qty 2

## 2022-01-09 MED ORDER — LOSARTAN POTASSIUM 50 MG PO TABS
100.0000 mg | ORAL_TABLET | Freq: Every day | ORAL | Status: DC
  Administered 2022-01-09 – 2022-01-11 (×3): 100 mg via ORAL
  Filled 2022-01-09 (×3): qty 2

## 2022-01-09 MED ORDER — PROCHLORPERAZINE EDISYLATE 10 MG/2ML IJ SOLN: 5.0000 mg | INTRAMUSCULAR | Status: DC | PRN

## 2022-01-09 MED ORDER — METOPROLOL TARTRATE 5 MG/5ML IV SOLN: 5.0000 mg | Freq: Four times a day (QID) | INTRAVENOUS | Status: DC | PRN

## 2022-01-09 MED ORDER — VITAMIN D3 25 MCG (1000 UNIT) PO TABS
1000.0000 [IU] | ORAL_TABLET | Freq: Every day | ORAL | Status: DC
  Administered 2022-01-09 – 2022-01-11 (×3): 1000 [IU] via ORAL
  Filled 2022-01-09 (×3): qty 1

## 2022-01-09 MED ORDER — POLYVINYL ALCOHOL 1.4 % OP SOLN: 1.0000 [drp] | Freq: Three times a day (TID) | OPHTHALMIC | Status: DC | PRN

## 2022-01-09 MED ORDER — PANTOPRAZOLE SODIUM 40 MG PO TBEC
40.0000 mg | DELAYED_RELEASE_TABLET | Freq: Every day | ORAL | Status: DC
  Administered 2022-01-09 – 2022-01-10 (×2): 40 mg via ORAL
  Filled 2022-01-09 (×2): qty 1

## 2022-01-09 MED ORDER — SODIUM CHLORIDE 0.9 % IV SOLN: 250.0000 mL | INTRAVENOUS | Status: DC | PRN

## 2022-01-09 MED ORDER — MENTHOL 3 MG MT LOZG: 1.0000 | LOZENGE | OROMUCOSAL | Status: DC | PRN

## 2022-01-09 MED ORDER — TRIAMCINOLONE ACETONIDE 0.1 % EX CREA
1.0000 | TOPICAL_CREAM | Freq: Every day | CUTANEOUS | Status: DC | PRN
Start: 2022-01-09 — End: 2022-01-11

## 2022-01-09 MED ORDER — GABAPENTIN 300 MG PO CAPS
600.0000 mg | ORAL_CAPSULE | Freq: Three times a day (TID) | ORAL | Status: DC
  Administered 2022-01-09 – 2022-01-11 (×7): 600 mg via ORAL
  Filled 2022-01-09 (×7): qty 2

## 2022-01-09 MED ORDER — MAGIC MOUTHWASH: 15.0000 mL | Freq: Four times a day (QID) | ORAL | Status: DC | PRN

## 2022-01-09 MED ORDER — ACETAMINOPHEN 500 MG PO TABS
1000.0000 mg | ORAL_TABLET | Freq: Four times a day (QID) | ORAL | Status: DC
  Administered 2022-01-09 – 2022-01-11 (×7): 1000 mg via ORAL
  Filled 2022-01-09 (×8): qty 2

## 2022-01-09 MED ORDER — SODIUM CHLORIDE 0.9% FLUSH: 3.0000 mL | INTRAVENOUS | Status: DC | PRN

## 2022-01-09 MED ORDER — OXYBUTYNIN CHLORIDE ER 5 MG PO TB24
5.0000 mg | ORAL_TABLET | Freq: Every day | ORAL | Status: DC
  Administered 2022-01-09 – 2022-01-10 (×2): 5 mg via ORAL
  Filled 2022-01-09 (×2): qty 1

## 2022-01-09 MED ORDER — SIMETHICONE 40 MG/0.6ML PO SUSP: 80.0000 mg | Freq: Four times a day (QID) | ORAL | Status: DC | PRN

## 2022-01-09 MED ORDER — PHENOL 1.4 % MT LIQD: 2.0000 | OROMUCOSAL | Status: DC | PRN

## 2022-01-09 NOTE — Progress Notes (Signed)
Maria Wang [id number] 10-Mar-1951  CARE TEAM:  PCP: Pa, [place]  Outpatient Care Team: Patient Care Team: Pa, [place] as PCP - General (Family Medicine)  Inpatient Treatment Team: Treatment Team: Attending Provider: Johnathan Hausen, MD; Technician: Elliot Gault, [place]; Utilization Review: Hetty Ely, RN; Registered Nurse: Tanda Rockers, RN; Case Manager: Frann Rider, RN   Problem List:   Principal Problem:   Diverticulitis of small intestine s/p jejunal resection 01/08/2022 Active Problems:   Small bowel diverticular disease   Body mass index (BMI) 45.0-49.9, adult ([name])   Essential hypertension   Hyperlipidemia   Hypertensive retinopathy   Jejunal ulcer   Obstructive sleep apnea (adult) (pediatric)   Recurrent major depression in remission ([name])   Diverticulosis of small intestine   1 Day Post-Op  01/08/2022  Postop Dx:      Small bowel with large diverticula  Procedure:      Lap assised jejunal resection with primary anastomosis Surgeon:  Kaylyn Lim, MD   Assessment  [facility]  [facility] Stay = 1 days)  Plan:  -Improve pain control.  Get back on home gabapentin.  Make Tylenol scheduled.  Switch to oxycodone.  As needed muscle relaxants.  As needed naproxen which she prefers.   ice pack to left upper quadrant flank incision line has some ecchymosis.  Resume home Wellbutrin. -Gradually advance diet. -Medlock IV fluids with as needed boluses.  Get back on usual daily Lasix. -History of sleep apnea.  Oxygen as needed.  CPAP as needed. -Hypertension can -VTE prophylaxis- SCDs, etc -mobilize as tolerated to help recovery  Disposition:  Disposition:  The patient is from: Home  Anticipate discharge to:  Home  Anticipated Date of Discharge is:  June 12,2023    Barriers to discharge:  Pending Clinical improvement (more likely than not)  Patient currently is NOT MEDICALLY STABLE for discharge  from the hospital from a surgery standpoint.      I reviewed last 24 h vitals and pain scores, last 48 h intake and output, last 24 h labs and trends, and last 24 h imaging results. I have reviewed this patient's available data, including medical history, events of note, test results, etc as part of my evaluation.  A significant portion of that time was spent in counseling.  Care during the described time interval was provided by me.  This care required moderate level of medical decision making.  01/09/2022    Subjective: (Chief complaint)  Sore.  Staying in bed.  Tolerated liquids.  Wanting to try more.  Objective:  Vital signs:  Vitals:   01/08/22 1721 01/08/22 2147 01/09/22 0157 01/09/22 0524  BP: 123/69 (!) 101/52 133/61 (!) 151/84  Pulse: 84 69 (!) 55 (!) 57  Resp: '20 16 16 16  '$ Temp: 98.1 F (36.7 C) 98 F (36.7 C) 97.6 F (36.4 C) 97.7 F (36.5 C)  TempSrc: Oral Oral Oral Oral  SpO2: 99% 97% 100% 100%  Weight:      Height:        Last BM Date : 01/07/22  Intake/Output   Yesterday:  06/09 0701 - 06/10 0700 In: 2810.6 [P.O.:340; I.V.:2270.6; IV Piggyback:200] Out: 1465 [Urine:1440; Blood:25] This shift:  No intake/output data recorded.  Bowel function:  Flatus: No  BM:  No  Drain: (No drain)   Physical Exam:  General: Pt awake/alert in no acute distress Eyes: PERRL, normal EOM.  Sclera clear.  No icterus Neuro: CN II-XII intact  w/o focal sensory/motor deficits. Lymph: No head/neck/groin lymphadenopathy Psych:  No delerium/psychosis/paranoia.  Oriented x 4 HENT: Normocephalic, Mucus membranes moist.  No thrush Neck: Supple, No tracheal deviation.  No obvious thyromegaly Chest: No pain to chest wall compression.  Good respiratory excursion.  No audible wheezing CV:  Pulses intact.  Regular rhythm.  No major extremity edema MS: Normal AROM mjr joints.  No obvious deformity  Abdomen: Obese. Soft.  Nondistended.  Tenderness at left upper quadrant  transverse flank incision with some lateral ecchymosis.  No bleeding or drainage. .  No evidence of peritonitis.  No incarcerated hernias.  Ext:  No deformity.  No mjr edema.  No cyanosis Skin: No petechiae / purpurea.  No major sores.  Warm and dry    Results:   Cultures: No results found for this or any previous visit (from the past 720 hour(s)).  Labs: Results for orders placed or performed during the hospital encounter of 01/08/22 (from the past 48 hour(s))  Glucose, capillary     Status: Abnormal   Collection Time: 01/08/22  5:45 AM  Result Value Ref Range   Glucose-Capillary 160 (H) 70 - 99 mg/dL    Comment: Glucose reference range applies only to samples taken after fasting for at least 8 hours.  ABO/Rh     Status: None   Collection Time: 01/08/22  5:55 AM  Result Value Ref Range   ABO/RH(D)      AB POS Performed at [facility], [place] [address], [place], [place] [address]   CBC     Status: Abnormal   Collection Time: 01/08/22  2:30 PM  Result Value Ref Range   WBC 11.0 (H) 4.0 - 10.5 K/uL   RBC 4.49 3.87 - 5.11 MIL/uL   Hemoglobin 13.9 12.0 - 15.0 g/dL   HCT 43.4 36.0 - 46.0 %   MCV 96.7 80.0 - 100.0 fL   MCH 31.0 26.0 - 34.0 pg   MCHC 32.0 30.0 - 36.0 g/dL   RDW 14.6 11.5 - 15.5 %   Platelets 211 150 - 400 K/uL   nRBC 0.0 0.0 - 0.2 %    Comment: Performed at [facility], [place] [address], [place], [place] [address]  Creatinine, serum     Status: None   Collection Time: 01/08/22  2:30 PM  Result Value Ref Range   Creatinine, Ser 0.81 0.44 - 1.00 mg/dL   GFR, Estimated >60 >60 mL/min    Comment: (NOTE) Calculated using the CKD-EPI Creatinine Equation (2021) Performed at [facility], [place] [address], [place], [place] [address]   CBC     Status: None   Collection Time: 01/09/22  5:11 AM  Result Value Ref Range   WBC 7.6 4.0 - 10.5 K/uL   RBC 4.20 3.87 - 5.11 MIL/uL   Hemoglobin 13.1 12.0 - 15.0 g/dL    HCT 42.0 36.0 - 46.0 %   MCV 100.0 80.0 - 100.0 fL   MCH 31.2 26.0 - 34.0 pg   MCHC 31.2 30.0 - 36.0 g/dL   RDW 14.9 11.5 - 15.5 %   Platelets 174 150 - 400 K/uL   nRBC 0.0 0.0 - 0.2 %    Comment: Performed at [facility], [place] [address], [place], [place] [address]  Basic metabolic panel     Status: Abnormal   Collection Time: 01/09/22  5:11 AM  Result Value Ref Range   Sodium 142 135 - 145 mmol/L   Potassium  3.7 3.5 - 5.1 mmol/L   Chloride 109 98 - 111 mmol/L   CO2 28 22 - 32 mmol/L   Glucose, Bld 120 (H) 70 - 99 mg/dL    Comment: Glucose reference range applies only to samples taken after fasting for at least 8 hours.   BUN 8 8 - 23 mg/dL   Creatinine, Ser 0.68 0.44 - 1.00 mg/dL   Calcium 8.9 8.9 - 10.3 mg/dL   GFR, Estimated >60 >60 mL/min    Comment: (NOTE) Calculated using the CKD-EPI Creatinine Equation (2021)    Anion gap 5 5 - 15    Comment: Performed at [facility], [place] [address], [place], [place] [address]    Imaging / Studies: No results found.  Medications / Allergies: per chart  Antibiotics: Anti-infectives (From admission, onward)    Start     Dose/Rate Route Frequency Ordered Stop   01/08/22 1600  ceFAZolin (ANCEF) IVPB 2g/100 mL premix        2 g 200 mL/hr over 30 Minutes Intravenous Every 8 hours 01/08/22 1331 01/08/22 1713   01/08/22 0600  ceFAZolin (ANCEF) IVPB 2g/100 mL premix        2 g 200 mL/hr over 30 Minutes Intravenous On call to O.R. 01/08/22 0531 01/08/22 8588         Note: Portions of this report may have been transcribed using voice recognition software. Every effort was made to ensure accuracy; however, inadvertent computerized transcription errors may be present.   Any transcriptional errors that result from this process are unintentional.    Adin Hector, MD, FACS, MASCRS Esophageal, Gastrointestinal & Colorectal Surgery Robotic and Minimally Invasive Surgery  [facility], [place]  [place]. [address] [place], [place] [address] [phone] Fax [phone] Main  CONTACT INFORMATION:  Weekday (9AM-5PM): Call [facility] main office at [phone]  Weeknight (5PM-9AM) or Weekend/Holiday: Check [email or web address] (password " [email or web address]") for General Surgery [facility] coverage  (Please, do not use SecureChat as it is not reliable communication to operating surgeons for immediate patient care)      01/09/2022  8:13 AM

## 2022-01-09 NOTE — Plan of Care (Signed)

## 2022-01-09 NOTE — TOC Initial Note (Signed)
Transition of Care [facility]) - Initial/Assessment Note    Patient Details  Name: Maria Wang MRN: [id number] Date of Birth: [birth year]  Transition of Care [facility]) CM/SW Contact:    Leeroy Cha, RN Phone Number: 01/09/2022, 8:29 AM  Clinical Narrative:                  Transition of Care [facility]) Screening Note   Patient Details  Name: Maria Wang Date of Birth: 12/22/1950   Transition of Care [facility]) CM/SW Contact:    Leeroy Cha, RN Phone Number: 01/09/2022, 8:29 AM    Transition of Care [facility]) has reviewed patient and no TOC needs have been identified at this time. We will continue to monitor patient advancement through interdisciplinary progression rounds. If new patient transition needs arise, please place a TOC consult.    Expected Discharge Plan: Home/Self Care Barriers to Discharge: No Barriers Identified   Patient Goals and CMS Choice Patient states their goals for this hospitalization and ongoing recovery are:: to go home CMS Medicare.gov Compare Post Acute Care list provided to:: Patient    Expected Discharge Plan and Services Expected Discharge Plan: Home/Self Care   Discharge Planning Services: CM Consult   Living arrangements for the past 2 months: Single Family Home                                      Prior Living Arrangements/Services Living arrangements for the past 2 months: Single Family Home Lives with:: Spouse Patient language and need for interpreter reviewed:: Yes Do you feel safe going back to the place where you live?: Yes      Need for Family Participation in Patient Care: Yes (Comment) (husband)     Criminal Activity/Legal Involvement Pertinent to Current Situation/Hospitalization: No - Comment as needed  Activities of Daily Living Home Assistive Devices/Equipment: Cane (specify quad or straight) ADL Screening (condition at time of admission) Patient's cognitive ability adequate to safely  complete daily activities?: Yes Is the patient deaf or have difficulty hearing?: No Does the patient have difficulty seeing, even when wearing glasses/contacts?: No Does the patient have difficulty concentrating, remembering, or making decisions?: No Patient able to express need for assistance with ADLs?: Yes Does the patient have difficulty dressing or bathing?: No Independently performs ADLs?: Yes (appropriate for developmental age) Does the patient have difficulty walking or climbing stairs?: No Weakness of Legs: None Weakness of Arms/Hands: None  Permission Sought/Granted                  Emotional Assessment Appearance:: Appears stated age Attitude/Demeanor/Rapport: Engaged Affect (typically observed): Calm Orientation: : Oriented to Self, Oriented to Place, Oriented to  Time, Oriented to Situation Alcohol / Substance Use: Not Applicable Psych Involvement: No (comment)  Admission diagnosis:  Small bowel diverticular disease [K57.10] Patient Active Problem List   Diagnosis Date Noted   Diverticulitis of small intestine s/p jejunal resection 01/08/2022 01/09/2022   Small bowel diverticular disease 01/08/2022   Body mass index (BMI) 45.0-49.9, adult ([place]) 11/18/2021   Essential hypertension 11/18/2021   Hyperlipidemia 11/18/2021   Hypertensive retinopathy 11/18/2021   Jejunal ulcer 11/18/2021   Liposarcoma ([place]) 11/18/2021   Obstructive sleep apnea (adult) (pediatric) 11/18/2021   Osteopenia 11/18/2021   Recurrent major depression in remission ([place]) 11/18/2021   Diverticulosis of small intestine 11/18/2021   PCP:  Pa, [place]:  [facility] - [place], [place] - [address] [place] [address] Phone: [phone] Fax: [phone]     Social Determinants of Health (SDOH) Interventions    Readmission Risk Interventions     No data to display

## 2022-01-10 MED ORDER — NAPROXEN 250 MG PO TABS: 500.0000 mg | ORAL_TABLET | Freq: Three times a day (TID) | ORAL | Status: DC | PRN

## 2022-01-10 MED ORDER — NAPROXEN 250 MG PO TABS
500.0000 mg | ORAL_TABLET | Freq: Two times a day (BID) | ORAL | Status: DC
  Administered 2022-01-10 – 2022-01-11 (×3): 500 mg via ORAL
  Filled 2022-01-10 (×4): qty 2

## 2022-01-10 NOTE — Progress Notes (Signed)
Maria Wang [id number] 1950/11/16  CARE TEAM:  PCP: Pa, [name] Team: Patient Care Team: Pa, [place] as PCP - General (Family Medicine)  Inpatient Treatment Team: Treatment Team: Attending Provider: Johnathan Hausen, MD; Registered Nurse: Tanda Rockers, RN   Problem List:   Principal Problem:   Diverticulitis of small intestine s/p jejunal resection 01/08/2022 Active Problems:   Small bowel diverticular disease   Body mass index (BMI) 45.0-49.9, adult ([place])   Essential hypertension   Hyperlipidemia   Hypertensive retinopathy   Jejunal ulcer   Obstructive sleep apnea (adult) (pediatric)   Recurrent major depression in remission ([place])   Diverticulosis of small intestine   2 Days Post-Op  01/08/2022  Postop Dx:      Small bowel with large diverticula  Procedure:      Lap assised jejunal resection with primary anastomosis Surgeon:  Kaylyn Lim, MD   Assessment  [facility]  [facility] Stay = 2 days)  Plan:  -Improve pain control.  Continue home gabapentin.  Make Tylenol scheduled.  Naproxen scheduled for switch to oxycodone.  As needed muscle relaxants.  .   ice pack to left upper quadrant flank incision line has some ecchymosis.  Resume home Wellbutrin. -Gradually advance diet. -Medlock IV fluids with as needed boluses.  Get back on usual daily Lasix. -History of sleep apnea.  Oxygen as needed.  CPAP as needed. -Hypertension -losartan.  Parameters to hold if too low.  Extra for breakthrough as needed. -VTE prophylaxis- SCDs, etc -mobilize as tolerated to help recovery  Disposition:  Disposition:  The patient is from: Home  Anticipate discharge to:  Home  Anticipated Date of Discharge is:  June 12,2023    Barriers to discharge:  Pending Clinical improvement (more likely than not)  Patient currently is NOT MEDICALLY STABLE for discharge from the hospital from a surgery standpoint.      I  reviewed last 24 h vitals and pain scores, last 48 h intake and output, last 24 h labs and trends, and last 24 h imaging results. I have reviewed this patient's available data, including medical history, events of note, test results, etc as part of my evaluation.  A significant portion of that time was spent in counseling.  Care during the described time interval was provided by me.  This care required moderate level of medical decision making.  01/10/2022    Subjective: (Chief complaint)  Feeling better today.  Still with some soreness especially transferring in and out of bed.  No nausea.  Tolerating liquids.  Objective:  Vital signs:  Vitals:   01/09/22 1311 01/09/22 1813 01/09/22 2303 01/10/22 0638  BP: 126/60 (!) 145/71 (!) 144/61 (!) 148/75  Pulse: (!) 54 (!) 51 (!) 55 (!) 50  Resp: '15 14 18 16  '$ Temp: 98.5 F (36.9 C) 98.5 F (36.9 C) 98.6 F (37 C) 97.7 F (36.5 C)  TempSrc: Oral Oral Oral Oral  SpO2: 94% 98% 95% 98%  Weight:      Height:        Last BM Date : 01/07/22  Intake/Output   Yesterday:  06/10 0701 - 06/11 0700 In: 320 [P.O.:320] Out: 1000 [Urine:1000] This shift:  No intake/output data recorded.  Bowel function:  Flatus: No  BM:  No  Drain: (No drain)   Physical Exam:  General: Pt awake/alert in no acute distress Eyes: PERRL, normal EOM.  Sclera clear.  No icterus Neuro: CN II-XII intact w/o  focal sensory/motor deficits. Lymph: No head/neck/groin lymphadenopathy Psych:  No delerium/psychosis/paranoia.  Oriented x 4 HENT: Normocephalic, Mucus membranes moist.  No thrush Neck: Supple, No tracheal deviation.  No obvious thyromegaly Chest: No pain to chest wall compression.  Good respiratory excursion.  No audible wheezing CV:  Pulses intact.  Regular rhythm.  No major extremity edema MS: Normal AROM mjr joints.  No obvious deformity  Abdomen: Obese. Soft.  Nondistended.  Tenderness at left upper quadrant transverse flank incision with  some lateral ecchymosis. - improved No bleeding or drainage. .  No evidence of peritonitis.  No incarcerated hernias.  Ext:  No deformity.  No mjr edema.  No cyanosis Skin: No petechiae / purpurea.  No major sores.  Warm and dry    Results:   Cultures: No results found for this or any previous visit (from the past 720 hour(s)).  Labs: Results for orders placed or performed during the hospital encounter of 01/08/22 (from the past 48 hour(s))  CBC     Status: Abnormal   Collection Time: 01/08/22  2:30 PM  Result Value Ref Range   WBC 11.0 (H) 4.0 - 10.5 K/uL   RBC 4.49 3.87 - 5.11 MIL/uL   Hemoglobin 13.9 12.0 - 15.0 g/dL   HCT 43.4 36.0 - 46.0 %   MCV 96.7 80.0 - 100.0 fL   MCH 31.0 26.0 - 34.0 pg   MCHC 32.0 30.0 - 36.0 g/dL   RDW 14.6 11.5 - 15.5 %   Platelets 211 150 - 400 K/uL   nRBC 0.0 0.0 - 0.2 %    Comment: Performed at [facility], [place] [address], [place], [place] [address]  Creatinine, serum     Status: None   Collection Time: 01/08/22  2:30 PM  Result Value Ref Range   Creatinine, Ser 0.81 0.44 - 1.00 mg/dL   GFR, Estimated >60 >60 mL/min    Comment: (NOTE) Calculated using the CKD-EPI Creatinine Equation (2021) Performed at [facility], [place] [address] [place], [place] [address]   CBC     Status: None   Collection Time: 01/09/22  5:11 AM  Result Value Ref Range   WBC 7.6 4.0 - 10.5 K/uL   RBC 4.20 3.87 - 5.11 MIL/uL   Hemoglobin 13.1 12.0 - 15.0 g/dL   HCT 42.0 36.0 - 46.0 %   MCV 100.0 80.0 - 100.0 fL   MCH 31.2 26.0 - 34.0 pg   MCHC 31.2 30.0 - 36.0 g/dL   RDW 14.9 11.5 - 15.5 %   Platelets 174 150 - 400 K/uL   nRBC 0.0 0.0 - 0.2 %    Comment: Performed at [facility], [place] [address] [place], [place] [address]  Basic metabolic panel     Status: Abnormal   Collection Time: 01/09/22  5:11 AM  Result Value Ref Range   Sodium 142 135 - 145 mmol/L   Potassium 3.7 3.5 - 5.1 mmol/L   Chloride  109 98 - 111 mmol/L   CO2 28 22 - 32 mmol/L   Glucose, Bld 120 (H) 70 - 99 mg/dL    Comment: Glucose reference range applies only to samples taken after fasting for at least 8 hours.   BUN 8 8 - 23 mg/dL   Creatinine, Ser 0.68 0.44 - 1.00 mg/dL   Calcium 8.9 8.9 - 10.3 mg/dL   GFR, Estimated >60 >60 mL/min    Comment: (NOTE) Calculated using the CKD-EPI Creatinine Equation (2021)  Anion gap 5 5 - 15    Comment: Performed at [facility], [place] [address] [place], [place] [address]    Imaging / Studies: No results found.  Medications / Allergies: per chart  Antibiotics: Anti-infectives (From admission, onward)    Start     Dose/Rate Route Frequency Ordered Stop   01/08/22 1600  ceFAZolin (ANCEF) IVPB 2g/100 mL premix        2 g 200 mL/hr over 30 Minutes Intravenous Every 8 hours 01/08/22 1331 01/08/22 1713   01/08/22 0600  ceFAZolin (ANCEF) IVPB 2g/100 mL premix        2 g 200 mL/hr over 30 Minutes Intravenous On call to O.R. 01/08/22 0531 01/08/22 2263         Note: Portions of this report may have been transcribed using voice recognition software. Every effort was made to ensure accuracy; however, inadvertent computerized transcription errors may be present.   Any transcriptional errors that result from this process are unintentional.    Adin Hector, MD, FACS, MASCRS Esophageal, Gastrointestinal & Colorectal Surgery Robotic and Minimally Invasive Surgery  [facility], [place]  [place]. [address], [place], [place] [address] [phone] Fax [phone] Main  CONTACT INFORMATION:  Weekday (9AM-5PM): Call [facility] main office at [phone]  Weeknight (5PM-9AM) or Weekend/Holiday: Check [email or web address] (password " [email or web address]") for General Surgery [facility] coverage  (Please, do not use SecureChat as it is not reliable communication to operating surgeons for immediate patient care)       01/10/2022  8:19 AM

## 2022-01-10 NOTE — Plan of Care (Signed)
  Problem: Activity: Goal: Risk for activity intolerance will decrease Outcome: Progressing   Problem: Nutrition: Goal: Adequate nutrition will be maintained Outcome: Progressing   Problem: Pain Managment: Goal: General experience of comfort will improve Outcome: Progressing   

## 2022-01-11 LAB — SURGICAL PATHOLOGY

## 2022-01-11 MED ORDER — HYDROCODONE-ACETAMINOPHEN 5-325 MG PO TABS: 1.0000 | ORAL_TABLET | Freq: Four times a day (QID) | ORAL | 0 refills | Status: DC | PRN

## 2022-01-11 NOTE — Plan of Care (Signed)
Patient discharging home via private vehicle with husband. Ivan Anchors, RN 01/11/22 2:52 PM

## 2022-01-11 NOTE — Discharge Summary (Signed)
Physician Discharge Summary  Patient ID: Maria Wang MRN: [id number] DOB/AGE: 1951/04/26 71 y.o.  PCP: [facility]  Admit date: 01/08/2022 Discharge date: 01/11/2022  Admission Diagnoses:  mass in the small bowel  Discharge Diagnoses:  small bowel diverticular disease  Principal Problem:   Diverticulitis of small intestine s/p jejunal resection 01/08/2022 Active Problems:   Small bowel diverticular disease   Body mass index (BMI) 45.0-49.9, adult (HCC)   Essential hypertension   Hyperlipidemia   Hypertensive retinopathy   Jejunal ulcer   Obstructive sleep apnea (adult) (pediatric)   Recurrent major depression in remission ([place])   Diverticulosis of small intestine   Surgery:  lap assisted small bowel resection  Discharged Condition: improved  Hospital Course:   Patient had surgery on Friday.  Started on liquids and advanced over the weekend.  Ready for discharge on Monday  Consults: none  Significant Diagnostic Studies: none    Discharge Exam: Blood pressure (!) 173/72, pulse (!) 53, temperature 98.2 F (36.8 C), temperature source Oral, resp. rate 18, height '5\' 3"'$  (1.6 m), weight 105.7 kg, SpO2 99 %. Incisions OK  Disposition: Discharge disposition: 01-Home or Self Care       Discharge Instructions     Call MD for:  redness, tenderness, or signs of infection (pain, swelling, redness, odor or green/yellow discharge around incision site)   Complete by: As directed    Call MD for:  severe uncontrolled pain   Complete by: As directed    Diet - low sodium heart healthy   Complete by: As directed    Discharge instructions   Complete by: As directed    May shower today and get in to pool by this weekend   Increase activity slowly   Complete by: As directed       Allergies as of 01/11/2022       Reactions   Penicillin G Hives   Tetanus Toxoids Other (See Comments)   Arm swelled up        Medication List     TAKE these  medications    buPROPion 300 MG 24 hr tablet Commonly known as: WELLBUTRIN XL Take 300 mg by mouth in the morning.   CALCIUM 600+D3 PO Take 1 tablet by mouth in the morning and at bedtime.   cholecalciferol 25 MCG (1000 UNIT) tablet Commonly known as: VITAMIN D Take 1,000 Units by mouth in the morning.   Coenzyme Q10 300 MG Caps Take 300 mg by mouth in the morning.   COSAMIN DS PO Take 2 tablets by mouth in the morning.   Fish Oil 1000 MG Caps Take 1,000 mg by mouth in the morning.   FLUoxetine 40 MG capsule Commonly known as: PROZAC Take 40 mg by mouth in the morning.   furosemide 20 MG tablet Commonly known as: LASIX Take 20 mg by mouth in the morning.   gabapentin 600 MG tablet Commonly known as: NEURONTIN Take 600 mg by mouth in the morning, at noon, and at bedtime.   HYDROcodone-acetaminophen 5-325 MG tablet Commonly known as: NORCO/VICODIN Take 1 tablet by mouth every 6 (six) hours as needed for moderate pain.   losartan 100 MG tablet Commonly known as: COZAAR Take 100 mg by mouth in the morning.   Lubricant Eye Drops 0.4-0.3 % Soln Generic drug: Polyethyl Glycol-Propyl Glycol Place 1-2 drops into both eyes 3 (three) times daily as needed (dry/irritated eyes.).   MACULAR HEALTH FORMULA PO Take 1 capsule by mouth in  the morning and at bedtime. Morning & afternoon   multivitamin with minerals Tabs tablet Take 1 tablet by mouth in the morning. One A Day for Women 50+   naproxen sodium 220 MG tablet Commonly known as: ALEVE Take 440 mg by mouth 2 (two) times daily as needed (pain.).   oxybutynin 5 MG 24 hr tablet Commonly known as: DITROPAN-XL Take 5 mg by mouth at bedtime.   triamcinolone cream 0.1 % Commonly known as: KENALOG Apply 1 application. topically daily as needed (ear eczema).   Vitamin B-12 2500 MCG Subl Place 2,500 mcg under the tongue in the morning.        Follow-up Information     Johnathan Hausen, MD. Schedule an appointment as  soon as possible for a visit in 3 day(s).   Specialty: General Surgery Why: For routine follow up with Dr. Carter Kitten information: [address] [place] [address] [phone]                 Signed: Pedro Earls 01/11/2022, 7:48 AM

## 2022-01-11 NOTE — Evaluation (Signed)
Physical Therapy Evaluation Patient Details Name: Maria Wang MRN: [id number] DOB: Jul 08, 1951 Today's Date: 01/11/2022  History of Present Illness  71 y.o. female admitted 01/08/22 with diverticulitis. s/p jejunal resection. PMH: HTN, OA, obesity, thyroid disease.  Clinical Impression  Pt is mobilizing independently, no further PT indicated. She ambulated 110', 20' with RW then 42' without an AD. Pt had no loss of balance without an assistive device, but gait was slower, step length decreased and less fluid. She would like a rollator for use in the community. PT signing off.        Recommendations for follow up therapy are one component of a multi-disciplinary discharge planning process, led by the attending physician.  Recommendations may be updated based on patient status, additional functional criteria and insurance authorization.  Follow Up Recommendations No PT follow up    Assistance Recommended at Discharge PRN  Patient can return home with the following  Assistance with cooking/housework;Assist for transportation    Equipment Recommendations Rollator (4 wheels)  Recommendations for Other Services       Functional Status Assessment Patient has not had a recent decline in their functional status     Precautions / Restrictions Precautions Precautions: Other (comment) Precaution Comments: abdominal surgery Restrictions Weight Bearing Restrictions: No      Mobility  Bed Mobility Overal bed mobility: Modified Independent             General bed mobility comments: used rail, HOB up    Transfers Overall transfer level: Independent                      Ambulation/Gait Ambulation/Gait assistance: Independent Gait Distance (Feet): 110 Feet Assistive device: None Gait Pattern/deviations: Step-through pattern, Decreased stride length Gait velocity: decr     General Gait Details: steady, no loss of balance; ambulated 20' with RW then rest without AD;  no loss of balance without AD however gait was slower and less fluid, pt stated she would like a rollator for home especially for when going out into the community  Stairs            Wheelchair Mobility    Modified Rankin (Stroke Patients Only)       Balance Overall balance assessment: Independent                                           Pertinent Vitals/Pain Pain Assessment Pain Assessment: 0-10 Pain Score: 3  Pain Location: abdomen Pain Descriptors / Indicators: Sore Pain Intervention(s): Limited activity within patient's tolerance, Monitored during session    Home Living Family/patient expects to be discharged to:: Private residence Living Arrangements: Spouse/significant other Available Help at Discharge: Family;Available 24 hours/day Type of Home: House Home Access: Stairs to enter   [employer] of Steps: 2   Home Layout: One level Home Equipment: Cane - single point      Prior Function Prior Level of Function : Independent/Modified Independent             Mobility Comments: used SPC prn 2* "bad knees", reports 1 fall in past 6 months ADLs Comments: independent     Hand Dominance        Extremity/Trunk Assessment   Upper Extremity Assessment Upper Extremity Assessment: Overall WFL for tasks assessed    Lower Extremity Assessment Lower Extremity Assessment: Overall WFL for tasks assessed  Cervical / Trunk Assessment Cervical / Trunk Assessment: Normal  Communication   Communication: No difficulties  Cognition Arousal/Alertness: Awake/alert Behavior During Therapy: WFL for tasks assessed/performed Overall Cognitive Status: Within Functional Limits for tasks assessed                                          General Comments      Exercises     Assessment/Plan    PT Assessment Patient does not need any further PT services  PT Problem List         PT Treatment Interventions       PT Goals (Current goals can be found in the Care Plan section)  Acute Rehab PT Goals Patient Stated Goal: lose more weight to prepare to TKA PT Goal Formulation: All assessment and education complete, DC therapy    Frequency       Co-evaluation               AM-PAC PT "6 Clicks" Mobility  Outcome Measure Help needed turning from your back to your side while in a flat bed without using bedrails?: None Help needed moving from lying on your back to sitting on the side of a flat bed without using bedrails?: None Help needed moving to and from a bed to a chair (including a wheelchair)?: None Help needed standing up from a chair using your arms (e.g., wheelchair or bedside chair)?: None Help needed to walk in hospital room?: None Help needed climbing 3-5 steps with a railing? : None 6 Click Score: 24    End of Session   Activity Tolerance: Patient tolerated treatment well Patient left: in bed;with call bell/phone within reach Nurse Communication: Mobility status      Time: 1610-9604 PT Time Calculation (min) (ACUTE ONLY): 8 min   Charges:   PT Evaluation $PT Eval Low Complexity: 1 Low         Philomena Doheny PT 01/11/2022  Acute Rehabilitation Services Pager [phone] Office [phone]

## 2022-01-11 NOTE — Plan of Care (Signed)
Problem: Education: Goal: Knowledge of General Education information will improve Description: Including pain rating scale, medication(s)/side effects and non-pharmacologic comfort measures Outcome: Progressing   Problem: Health Behavior/Discharge Planning: Goal: Ability to manage health-related needs will improve Outcome: Progressing   Problem: Clinical Measurements: Goal: Ability to maintain clinical measurements within normal limits will improve Outcome: [place], RN 01/11/22 12:12 PM

## 2022-01-11 NOTE — TOC Transition Note (Signed)
Transition of Care [facility]) - CM/SW Discharge Note   Patient Details  Name: Maria Wang MRN: [id number] Date of Birth: 04-18-1951  Transition of Care [facility]) CM/SW Contact:  Lennart Pall, LCSW Phone Number: 01/11/2022, 10:55 AM   Clinical Narrative:    Alerted by PT of recommendation for rollator walker.  Pt agreeable and no agency preference.  Order sent to [employer] for delivery to pt's room today prior to dc.  No further TOC needs.   Final next level of care: Home/Self Care Barriers to Discharge: No Barriers Identified   Patient Goals and CMS Choice Patient states their goals for this hospitalization and ongoing recovery are:: to go home CMS Medicare.gov Compare Post Acute Care list provided to:: Patient    Discharge Placement                       Discharge Plan and Services   Discharge Planning Services: CM Consult            DME Arranged: Walker rolling with seat DME Agency: [employer] Date DME Agency Contacted: 01/11/22 Time DME Agency Contacted: 1000 Representative spoke with at DME Agency: [place] ([place]) Interventions     Readmission Risk Interventions     No data to display

## 2022-01-11 NOTE — Progress Notes (Signed)
OT Cancellation Note  Patient Details Name: Maria Wang MRN: [id number] DOB: 11/05/1950   Cancelled Treatment:    Reason Eval/Treat Not Completed: OT screened, no needs identified, will sign off Patient is MI for all ADL tasks at this time. OT to sign off. Tnak you for referral  [name] OTR/L, MS Acute Rehabilitation Department Office# [phone] Pager# [phone]  01/11/2022, 10:58 AM

## 2022-01-26 DIAGNOSIS — Z1231 Encounter for screening mammogram for malignant neoplasm of breast: Secondary | ICD-10-CM | POA: Diagnosis not present

## 2022-02-09 DIAGNOSIS — G4733 Obstructive sleep apnea (adult) (pediatric): Secondary | ICD-10-CM | POA: Diagnosis not present

## 2022-02-09 DIAGNOSIS — E785 Hyperlipidemia, unspecified: Secondary | ICD-10-CM | POA: Diagnosis not present

## 2022-02-09 DIAGNOSIS — I10 Essential (primary) hypertension: Secondary | ICD-10-CM | POA: Diagnosis not present

## 2022-02-09 DIAGNOSIS — F3341 Major depressive disorder, recurrent, in partial remission: Secondary | ICD-10-CM | POA: Diagnosis not present

## 2022-02-09 DIAGNOSIS — Z1159 Encounter for screening for other viral diseases: Secondary | ICD-10-CM | POA: Diagnosis not present

## 2022-02-09 DIAGNOSIS — Z1211 Encounter for screening for malignant neoplasm of colon: Secondary | ICD-10-CM | POA: Diagnosis not present

## 2022-02-09 DIAGNOSIS — N3941 Urge incontinence: Secondary | ICD-10-CM | POA: Diagnosis not present

## 2022-02-09 DIAGNOSIS — R739 Hyperglycemia, unspecified: Secondary | ICD-10-CM | POA: Diagnosis not present

## 2022-02-09 DIAGNOSIS — Z0000 Encounter for general adult medical examination without abnormal findings: Secondary | ICD-10-CM | POA: Diagnosis not present

## 2022-11-29 ENCOUNTER — Encounter: Payer: Self-pay | Admitting: Family Medicine

## 2022-11-29 ENCOUNTER — Ambulatory Visit (INDEPENDENT_AMBULATORY_CARE_PROVIDER_SITE_OTHER): Payer: PPO | Admitting: Family Medicine

## 2022-11-29 VITALS — BP 130/90 | HR 57 | Temp 97.9°F | Resp 18 | Ht 63.0 in | Wt 232.5 lb

## 2022-11-29 DIAGNOSIS — I10 Essential (primary) hypertension: Secondary | ICD-10-CM

## 2022-11-29 DIAGNOSIS — R1084 Generalized abdominal pain: Secondary | ICD-10-CM

## 2022-11-29 DIAGNOSIS — Z79899 Other long term (current) drug therapy: Secondary | ICD-10-CM

## 2022-11-29 DIAGNOSIS — Z820 Family history of epilepsy and other diseases of the nervous system: Secondary | ICD-10-CM | POA: Diagnosis not present

## 2022-11-29 DIAGNOSIS — F3340 Major depressive disorder, recurrent, in remission, unspecified: Secondary | ICD-10-CM | POA: Diagnosis not present

## 2022-11-29 DIAGNOSIS — C499 Malignant neoplasm of connective and soft tissue, unspecified: Secondary | ICD-10-CM | POA: Diagnosis not present

## 2022-11-29 DIAGNOSIS — G522 Disorders of vagus nerve: Secondary | ICD-10-CM | POA: Diagnosis not present

## 2022-11-29 DIAGNOSIS — N3281 Overactive bladder: Secondary | ICD-10-CM | POA: Diagnosis not present

## 2022-11-29 MED ORDER — OXYBUTYNIN CHLORIDE ER 10 MG PO TB24: 10.0000 mg | ORAL_TABLET | Freq: Every day | ORAL | 3 refills | Status: DC

## 2022-11-29 NOTE — Assessment & Plan Note (Signed)
Chronic.  Well-controlled.  Continue Wellbutrin 300 mg daily and Prozac 20 mg daily

## 2022-11-29 NOTE — Assessment & Plan Note (Signed)
Chronic Controlled Continue gabapentin 600 mg 3 times daily 

## 2022-11-29 NOTE — Assessment & Plan Note (Signed)
Chronic.  In remission.  Located in the right breast.  Status postlumpectomy.

## 2022-11-29 NOTE — Assessment & Plan Note (Signed)
Patient is active, does puzzles, trying to prevent.  She is wondering if she wants to do extensive baseline testing (sister has done) advised that we could send her to a neurologist.  She would like to think about it.

## 2022-11-29 NOTE — Patient Instructions (Signed)
Welcome to [facility]! It was a pleasure meeting you today.  As discussed, Please schedule a 6 month follow up visit today.  Increase the ditropan to 10mg   PLEASE NOTE:  If you had any LAB tests please let us know if you have not heard back within a few days. You may see your results on MyChart before we have a chance to review them but we will give you a call once they are reviewed by [place]. If we ordered any REFERRALS today, please let us know if you have not heard from their office within the next week.  Let us know through MyChart if you are needing REFILLS, or have your pharmacy send [place] the request. You can also use MyChart to communicate with me or any office staff.  Please try these tips to maintain a healthy lifestyle:  Eat most of your calories during the day when you are active. Eliminate processed foods including packaged sweets (pies, cakes, cookies), reduce intake of potatoes, white bread, white pasta, and white rice. Look for whole grain options, oat flour or almond flour.  Each meal should contain half fruits/vegetables, one quarter protein, and one quarter carbs (no bigger than a computer mouse).  Cut down on sweet beverages. This includes juice, soda, and sweet tea. Also watch fruit intake, though this is a healthier sweet option, it still contains natural sugar! Limit to 3 servings daily.  Drink at least 1 glass of water with each meal and aim for at least 8 glasses per day  Exercise at least 150 minutes every week.

## 2022-11-29 NOTE — Progress Notes (Signed)
New Patient Office Visit  Subjective:  Patient ID: Maria Wang, female    DOB: Sep 26, 1950  Age: 72 y.o. MRN: [id number]  CC:  Chief Complaint  Patient presents with   Establish Care    Initial visit to establish care with new pcp Spots on skin  Pressure right lower back    HPI Maria Wang presents for new patient.  Skin and back HYPERTENSION-Pt is on Losartan 100, furosemide 20mg .  Bp's running not checking  No ha/cp/palp/edema/cough.  Occasional dyspnea on exertion-old.  Has been worked up.  Occasional dizziness   Depression-controlled on medications.  No SI.  Was suicidal on ORAL CONTRACEPTIVE PILL(S)(58yrs ago).  Was in therapy in past.  All is good now Vagus neuropathy-chronic cough and nausea-resolved on gabapentin. If stops taking it, cough returns. OAB-ditropan helps some.  Still leaking some.   Spots on skin left thigh and right breast Pressure right mid back and rad to front.  No change w/food.  Going on 2 month(s).  Cholecystectomy >40 years ago.  No injury.  Awareness.   Has mamm and DXA schedule. Colon >5y   Current Outpatient Medications:    buPROPion (WELLBUTRIN XL) 300 MG 24 hr tablet, Take 300 mg by mouth in the morning., Disp: , Rfl:    Calcium Carb-Cholecalciferol (CALCIUM 600+D3 PO), Take 1 tablet by mouth in the morning and at bedtime., Disp: , Rfl:    cholecalciferol (VITAMIN D) 25 MCG (1000 UNIT) tablet, Take 1,000 Units by mouth in the morning., Disp: , Rfl:    Coenzyme Q10 300 MG CAPS, Take 300 mg by mouth in the morning., Disp: , Rfl:    Cyanocobalamin (VITAMIN B-12) 2500 MCG SUBL, Place 2,500 mcg under the tongue in the morning., Disp: , Rfl:    FLUoxetine (PROZAC) 20 MG capsule, Take 20 mg by mouth daily., Disp: , Rfl:    furosemide (LASIX) 20 MG tablet, Take 20 mg by mouth in the morning., Disp: , Rfl:    gabapentin (NEURONTIN) 600 MG tablet, Take 600 mg by mouth in the morning, at noon, and at bedtime., Disp: , Rfl:     Glucosamine-Chondroitin (COSAMIN DS PO), Take 2 tablets by mouth in the morning., Disp: , Rfl:    losartan (COZAAR) 100 MG tablet, Take 100 mg by mouth in the morning., Disp: , Rfl:    Multiple Vitamin (MULTIVITAMIN WITH MINERALS) TABS tablet, Take 1 tablet by mouth in the morning. One A Day for Women 50+, Disp: , Rfl:    Multiple Vitamins-Minerals (MACULAR HEALTH FORMULA PO), Take 1 capsule by mouth in the morning and at bedtime. Morning & afternoon, Disp: , Rfl:    naproxen sodium (ALEVE) 220 MG tablet, Take 440 mg by mouth 2 (two) times daily as needed (pain.)., Disp: , Rfl:    Omega-3 Fatty Acids (FISH OIL) 1000 MG CAPS, Take 1,000 mg by mouth in the morning., Disp: , Rfl:    Polyethyl Glycol-Propyl Glycol (LUBRICANT EYE DROPS) 0.4-0.3 % SOLN, Place 1-2 drops into both eyes 3 (three) times daily as needed (dry/irritated eyes.)., Disp: , Rfl:    triamcinolone cream (KENALOG) 0.1 %, Apply 1 application. topically daily as needed (ear eczema)., Disp: , Rfl:    oxybutynin (DITROPAN-XL) 10 MG 24 hr tablet, Take 1 tablet (10 mg total) by mouth at bedtime., Disp: 90 tablet, Rfl: 3  Past Medical History:  Diagnosis Date   Allergy    Anxiety    Arthritis    Cancer (HCC)  right breast lumpectomy   Cataract    Complication of anesthesia    slow to wake up   Depression    GERD (gastroesophageal reflux disease)    Hepatitis    prb A   History of kidney stones    Hypertension    PONV (postoperative nausea and vomiting)    Sleep apnea    cpap   Thyroid disease     Past Surgical History:  Procedure Laterality Date   BREAST SURGERY Right    lumpectomy   chholecystectomy      CHOLECYSTECTOMY  1987   HERNIA REPAIR     umbilical   kn ee surgery  Left 1984   scope   LAPAROSCOPIC SMALL BOWEL RESECTION N/A 01/08/2022   Procedure: LAPAROSCOPIC SMALL BOWEL RESECTION;  Surgeon: Luretha Murphy, MD;  Location: [facility];  Service: General;  Laterality: N/A;   right breast lumpectomy   2015    SMALL INTESTINE SURGERY     divertic of small intestine   UMBILICAL HERNIA REPAIR      Family History  Problem Relation Age of Onset   Arthritis Mother    Miscarriages / [place] Mother    Alzheimer's disease Mother    Cancer Father    Depression Sister    ADD / ADHD Brother    Alcohol abuse Brother    Drug abuse Brother    Alzheimer's disease Maternal Aunt    Diabetes Maternal Grandmother    Diabetes Paternal Grandmother    Obesity Paternal Grandmother     Social History   Socioeconomic History   Marital status: Married    Spouse name: Not on file   Number of children: 3   Years of education: Not on file   Highest education level: Not on file  Occupational History   Not on file  Tobacco Use   Smoking status: Former    Packs/day: 2.00    Years: 5.00    Additional pack years: 0.00    Total pack years: 10.00    Types: Cigarettes    Quit date: 07/03/1975    Years since quitting: 47.4   Smokeless tobacco: Not on file  Vaping Use   Vaping Use: Never used  Substance and Sexual Activity   Alcohol use: Yes    Alcohol/week: 8.0 - 10.0 standard drinks of alcohol    Types: 8 - 10 Shots of liquor per week    Comment: drink every evening 1-2   Drug use: Not Currently    Types: Marijuana   Sexual activity: Not Currently    Birth control/protection: Abstinence  Other Topics Concern   Not on file  Social History Narrative   [id number]-twins(boy and girl)      Retired.  Religious educator   Social Determinants of Health   Financial Resource Strain: Not on file  Food Insecurity: Not on file  Transportation Needs: Not on file  Physical Activity: Not on file  Stress: Not on file  Social Connections: Not on file  Intimate Partner Violence: Not on file    ROS  ROS: Gen: no fever, chills  Skin: no rash, itching ENT: no ear pain, ear drainage, nasal congestion, rhinorrhea, sinus pressure, sore throat Eyes: no blurry vision, double vision Resp: no cough,  wheeze,SOB CV: no CP, palpitations, LE edema,  GI: no heartburn, abd pain  some IBS-controlled on low carb diet GU: no dysuria, urgency, frequency, hematuria ZOX:WRUE B.   Tendonitis top of left foot. -sees pod, has orthotics. Neuro: no  dizziness, headache, weakness, vertigo Psych: HPI  Objective:   Today's Vitals: BP (!) 130/90 (BP Location: Left Arm, Patient Position: Sitting, Cuff Size: Large)   Pulse (!) 57   Temp 97.9 F (36.6 C) (Temporal)   Resp 18   Ht 5\' 3"  (1.6 m)   Wt 232 lb 8 oz (105.5 kg)   SpO2 97%   BMI 41.19 kg/m   Physical Exam  Gen: WDWN NAD HEENT: NCAT, conjunctiva not injected, sclera nonicteric NECK:  supple, no thyromegaly, no nodes, no carotid bruits CARDIAC: RRR, S1S2+, no murmur. DP 2+B LUNGS: CTAB. No wheezes ABDOMEN:  BS+, soft, mild/moderate tender diffusely but especially RUQ., No HSM, no masses EXT:  no edema MSK: cane  NEURO: A&O x3.  CN II-XII intact.  PSYCH: normal mood. Good eye contact   Assessment & Plan:  Essential hypertension Assessment & Plan: Chronic.  Fair control.  Continue losartan 100 mg daily and Lasix 20 mg daily.  Monitor blood pressures at home.  Check CBC, CMP, TSH, A1c, lipids  Orders: -     CBC with Differential/Platelet -     Comprehensive metabolic panel -     Hemoglobin A1c -     Lipid panel -     TSH  Recurrent major depression in remission [facility]) Assessment & Plan: Chronic.  Well-controlled.  Continue Wellbutrin 300 mg daily and Prozac 20 mg daily   OAB (overactive bladder) Assessment & Plan: Chronic.  Not ideal.  Will increase Ditropan to 10 mg extended release daily.   Liposarcoma (HCC) Assessment & Plan: Chronic.  In remission.  Located in the right breast.  Status postlumpectomy.   Vagus nerve disease Assessment & Plan: Chronic.  Controlled.  Continue gabapentin 600 mg 3 times daily   Family history of Alzheimer's disease Assessment & Plan: Patient is active, does puzzles, trying to prevent.   She is wondering if she wants to do extensive baseline testing (sister has done) advised that we could send her to a neurologist.  She would like to think about it.   Generalized abdominal pain -     CT ABDOMEN PELVIS W CONTRAST; Future -     CBC with Differential/Platelet -     Comprehensive metabolic panel  High risk medication use -     Comprehensive metabolic panel -     Hemoglobin A1c  Other orders -     oxyBUTYnin Chloride ER; Take 1 tablet (10 mg total) by mouth at bedtime.  Dispense: 90 tablet; Refill: 3  Abdominal pain-started recently.  Patient has a history of small bowel diverticuli.  Also, liposarcoma right breast.  Will check CT of the abdomen.  Also, check CBC, CMP  Follow up 6 mo  Follow-up: Return in about 6 months (around 05/31/2023) for HTN, mood.   Angelena Sole, MD

## 2022-11-29 NOTE — Assessment & Plan Note (Signed)
Chronic.  Not ideal.  Will increase Ditropan to 10 mg extended release daily.

## 2022-11-29 NOTE — Assessment & Plan Note (Signed)
Chronic.  Fair control.  Continue losartan 100 mg daily and Lasix 20 mg daily.  Monitor blood pressures at home.  Check CBC, CMP, TSH, A1c, lipids

## 2022-11-30 LAB — COMPREHENSIVE METABOLIC PANEL
ALT: 17 U/L (ref 0–35)
AST: 15 U/L (ref 0–37)
Albumin: 3.9 g/dL (ref 3.5–5.2)
Alkaline Phosphatase: 69 U/L (ref 39–117)
BUN: 17 mg/dL (ref 6–23)
CO2: 28 mEq/L (ref 19–32)
Calcium: 8.9 mg/dL (ref 8.4–10.5)
Chloride: 105 mEq/L (ref 96–112)
Creatinine, Ser: 0.72 mg/dL (ref 0.40–1.20)
GFR: 83.72 mL/min (ref >60.00)
Glucose, Bld: 109 mg/dL — ABNORMAL HIGH (ref 70–99)
Potassium: 4.3 mEq/L (ref 3.5–5.1)
Sodium: 140 mEq/L (ref 135–145)
Total Bilirubin: 0.8 mg/dL (ref 0.2–1.2)
Total Protein: 6.3 g/dL (ref 6.0–8.3)

## 2022-11-30 LAB — CBC WITH DIFFERENTIAL/PLATELET
Basophils Absolute: 0.1 10*3/uL (ref 0.0–0.1)
Basophils Relative: 1.1 % (ref 0.0–3.0)
Eosinophils Absolute: 0.2 10*3/uL (ref 0.0–0.7)
Eosinophils Relative: 3.1 % (ref 0.0–5.0)
HCT: 43 % (ref 36.0–46.0)
Hemoglobin: 14.4 g/dL (ref 12.0–15.0)
Lymphocytes Relative: 38.5 % (ref 12.0–46.0)
Lymphs Abs: 2.1 10*3/uL (ref 0.7–4.0)
MCHC: 33.5 g/dL (ref 30.0–36.0)
MCV: 95.6 fl (ref 78.0–100.0)
Monocytes Absolute: 0.4 10*3/uL (ref 0.1–1.0)
Monocytes Relative: 8 % (ref 3.0–12.0)
Neutro Abs: 2.7 10*3/uL (ref 1.4–7.7)
Neutrophils Relative %: 49.3 % (ref 43.0–77.0)
Platelets: 209 10*3/uL (ref 150.0–400.0)
RBC: 4.5 Mil/uL (ref 3.87–5.11)
RDW: 14.7 % (ref 11.5–15.5)
WBC: 5.4 10*3/uL (ref 4.0–10.5)

## 2022-11-30 LAB — HEMOGLOBIN A1C: Hgb A1c MFr Bld: 5.4 % (ref 4.6–6.5)

## 2022-11-30 LAB — LIPID PANEL
Cholesterol: 177 mg/dL (ref 0–200)
HDL: 55.4 mg/dL (ref >39.00)
LDL Cholesterol: 103 mg/dL — ABNORMAL HIGH (ref 0–99)
NonHDL: 121.2
Total CHOL/HDL Ratio: 3
Triglycerides: 91 mg/dL (ref 0.0–149.0)
VLDL: 18.2 mg/dL (ref 0.0–40.0)

## 2022-11-30 LAB — TSH: TSH: 0.7 u[IU]/mL (ref 0.35–5.50)

## 2022-12-07 ENCOUNTER — Ambulatory Visit
Admission: RE | Admit: 2022-12-07 | Discharge: 2022-12-07 | Disposition: A | Discharge status: Home / Self Care | Payer: PPO | Source: Ambulatory Visit | Attending: Family Medicine | Admitting: Family Medicine

## 2022-12-07 DIAGNOSIS — R109 Unspecified abdominal pain: Secondary | ICD-10-CM | POA: Diagnosis not present

## 2022-12-07 DIAGNOSIS — K5730 Diverticulosis of large intestine without perforation or abscess without bleeding: Secondary | ICD-10-CM | POA: Diagnosis not present

## 2022-12-07 DIAGNOSIS — I700 Atherosclerosis of aorta: Secondary | ICD-10-CM | POA: Diagnosis not present

## 2022-12-07 DIAGNOSIS — R1084 Generalized abdominal pain: Secondary | ICD-10-CM

## 2022-12-07 MED ORDER — IOPAMIDOL (ISOVUE-300) INJECTION 61%
100.0000 mL | Freq: Once | INTRAVENOUS | Status: AC | PRN
  Administered 2022-12-07: 100 mL via INTRAVENOUS

## 2022-12-21 ENCOUNTER — Ambulatory Visit (INDEPENDENT_AMBULATORY_CARE_PROVIDER_SITE_OTHER): Payer: PPO | Admitting: Family Medicine

## 2022-12-21 ENCOUNTER — Encounter: Payer: Self-pay | Admitting: Family Medicine

## 2022-12-21 VITALS — BP 120/76 | HR 60 | Temp 97.7°F | Resp 18 | Ht 63.0 in | Wt 226.0 lb

## 2022-12-21 DIAGNOSIS — S80862A Insect bite (nonvenomous), left lower leg, initial encounter: Secondary | ICD-10-CM | POA: Diagnosis not present

## 2022-12-21 DIAGNOSIS — W57XXXA Bitten or stung by nonvenomous insect and other nonvenomous arthropods, initial encounter: Secondary | ICD-10-CM

## 2022-12-21 NOTE — Patient Instructions (Signed)
Watch for signs/symptoms infection, etc.

## 2022-12-21 NOTE — Progress Notes (Signed)
Subjective:     Patient ID: Maria Wang, female    DOB: 1951-07-13, 72 y.o.   MRN: [id number]  Chief Complaint  Patient presents with   Tick Removal    Tick bite left lower leg about an 1 hour ago, red and itchy     HPI  Patient not know how long there. Saw very imbedded tick left lower leg.  Removed but not sure all of it.  Red and itchy.  No fevers/chills. No new arthralgia.   Health Maintenance Due  Topic Date Due   Hepatitis C Screening  Never done   COLONOSCOPY (Pts 45-27yrs Insurance coverage will need to be confirmed)  Never done   DEXA SCAN  Never done   Medicare Annual Wellness (AWV)  02/10/2023    Past Medical History:  Diagnosis Date   Allergy    Anxiety    Arthritis    Cancer (HCC)    right breast lumpectomy   Cataract    Complication of anesthesia    slow to wake up   Depression    GERD (gastroesophageal reflux disease)    Hepatitis    prb A   History of kidney stones    Hypertension    PONV (postoperative nausea and vomiting)    Sleep apnea    cpap   Thyroid disease     Past Surgical History:  Procedure Laterality Date   BREAST SURGERY Right    lumpectomy   chholecystectomy      CHOLECYSTECTOMY  1987   HERNIA REPAIR     umbilical   kn ee surgery  Left 1984   scope   LAPAROSCOPIC SMALL BOWEL RESECTION N/A 01/08/2022   Procedure: LAPAROSCOPIC SMALL BOWEL RESECTION;  Surgeon: Luretha Murphy, MD;  Location: [facility];  Service: General;  Laterality: N/A;   right breast lumpectomy   2015   SMALL INTESTINE SURGERY     divertic of small intestine   UMBILICAL HERNIA REPAIR       Current Outpatient Medications:    buPROPion (WELLBUTRIN XL) 300 MG 24 hr tablet, Take 300 mg by mouth in the morning., Disp: , Rfl:    Calcium Carb-Cholecalciferol (CALCIUM 600+D3 PO), Take 1 tablet by mouth in the morning and at bedtime., Disp: , Rfl:    cholecalciferol (VITAMIN D) 25 MCG (1000 UNIT) tablet, Take 1,000 Units by mouth in the morning., Disp: ,  Rfl:    Coenzyme Q10 300 MG CAPS, Take 300 mg by mouth in the morning., Disp: , Rfl:    Cyanocobalamin (VITAMIN B-12) 2500 MCG SUBL, Place 2,500 mcg under the tongue in the morning., Disp: , Rfl:    FLUoxetine (PROZAC) 20 MG capsule, Take 20 mg by mouth daily., Disp: , Rfl:    furosemide (LASIX) 20 MG tablet, Take 20 mg by mouth in the morning., Disp: , Rfl:    gabapentin (NEURONTIN) 600 MG tablet, Take 600 mg by mouth in the morning, at noon, and at bedtime., Disp: , Rfl:    Glucosamine-Chondroitin (COSAMIN DS PO), Take 2 tablets by mouth in the morning., Disp: , Rfl:    losartan (COZAAR) 100 MG tablet, Take 100 mg by mouth in the morning., Disp: , Rfl:    Multiple Vitamin (MULTIVITAMIN WITH MINERALS) TABS tablet, Take 1 tablet by mouth in the morning. One A Day for Women 50+, Disp: , Rfl:    Multiple Vitamins-Minerals (MACULAR HEALTH FORMULA PO), Take 1 capsule by mouth in the morning and at bedtime. Morning &  afternoon, Disp: , Rfl:    naproxen sodium (ALEVE) 220 MG tablet, Take 440 mg by mouth 2 (two) times daily as needed (pain.)., Disp: , Rfl:    Omega-3 Fatty Acids (FISH OIL) 1000 MG CAPS, Take 1,000 mg by mouth in the morning., Disp: , Rfl:    oxybutynin (DITROPAN-XL) 10 MG 24 hr tablet, Take 1 tablet (10 mg total) by mouth at bedtime., Disp: 90 tablet, Rfl: 3   Polyethyl Glycol-Propyl Glycol (LUBRICANT EYE DROPS) 0.4-0.3 % SOLN, Place 1-2 drops into both eyes 3 (three) times daily as needed (dry/irritated eyes.)., Disp: , Rfl:    triamcinolone cream (KENALOG) 0.1 %, Apply 1 application. topically daily as needed (ear eczema)., Disp: , Rfl:   Allergies  Allergen Reactions   Penicillin G Hives    Other Reaction(s): hives as child   Tetanus Immune Globulin     Other Reaction(s): severe swelling at site, upper arm   Tetanus Toxoids Other (See Comments)    Arm swelled up   ROS neg/noncontributory except as noted HPI/below      Objective:     BP 120/76   Pulse 60   Temp 97.7 F  (36.5 C) (Temporal)   Resp 18   Ht 5\' 3"  (1.6 m)   Wt 226 lb (102.5 kg)   SpO2 95%   BMI 40.03 kg/m  Wt Readings from Last 3 Encounters:  12/21/22 226 lb (102.5 kg)  11/29/22 232 lb 8 oz (105.5 kg)  01/08/22 233 lb 0.4 oz (105.7 kg)    Physical Exam   Gen: WDWN NAD HEENT: NCAT, conjunctiva not injected, sclera nonicteric EXT:  no edema MSK: no gross abnormalities.  NEURO: A&O x3.  CN II-XII intact.  PSYCH: normal mood. Good eye contact Left inner calf: Parts of embedded tick still present.  Some mild erythema. Procedure note: Verbal consent obtained.  Anesthesia 2% lidocaine with epi.  Alcohol prep.  First tried forceps to remove tick parts.  Then tried 25-gauge needle to tease some of it out.  Ended up having to use a 10 blade scalpel and make a small opening and then was able to tease the parts out.  Minimal bleeding.  Patient tolerated well.  Band-Aid applied.  Advised to keep clean with soap and water.  Monitor for signs or symptoms of infection     Assessment & Plan:  Tick bite of left lower leg, initial encounter   Tick bite left lower extremity-removed.  Monitor for signs & symptoms infection, lyme's.  Angelena Sole, MD

## 2022-12-31 DIAGNOSIS — H18593 Other hereditary corneal dystrophies, bilateral: Secondary | ICD-10-CM | POA: Diagnosis not present

## 2022-12-31 DIAGNOSIS — H04123 Dry eye syndrome of bilateral lacrimal glands: Secondary | ICD-10-CM | POA: Diagnosis not present

## 2022-12-31 DIAGNOSIS — H35371 Puckering of macula, right eye: Secondary | ICD-10-CM | POA: Diagnosis not present

## 2022-12-31 DIAGNOSIS — H524 Presbyopia: Secondary | ICD-10-CM | POA: Diagnosis not present

## 2022-12-31 DIAGNOSIS — H40013 Open angle with borderline findings, low risk, bilateral: Secondary | ICD-10-CM | POA: Diagnosis not present

## 2022-12-31 DIAGNOSIS — H353132 Nonexudative age-related macular degeneration, bilateral, intermediate dry stage: Secondary | ICD-10-CM | POA: Diagnosis not present

## 2023-01-06 ENCOUNTER — Ambulatory Visit (INDEPENDENT_AMBULATORY_CARE_PROVIDER_SITE_OTHER): Payer: PPO

## 2023-01-06 VITALS — Wt 226.0 lb

## 2023-01-06 DIAGNOSIS — Z0000 Encounter for general adult medical examination without abnormal findings: Secondary | ICD-10-CM

## 2023-01-06 NOTE — Progress Notes (Signed)
Subjective:   Maria Wang is a 72 y.o. female who presents for Medicare Annual (Subsequent) preventive examination.  Review of Systems     Cardiac Risk Factors include: advanced age (>70men, >60 women);hypertension;obesity (BMI >30kg/m2);dyslipidemia     Objective:    Today's Vitals   01/06/23 1159  Weight: 226 lb (102.5 kg)   Body mass index is 40.03 kg/m.     01/06/2023   12:05 PM 01/08/2022    1:31 PM 12/31/2021    9:11 AM  Advanced Directives  Does Patient Have a Medical Advance Directive? No No No  Would patient like information on creating a medical advance directive? No - Patient declined No - Patient declined     Current Medications (verified) Outpatient Encounter Medications as of 01/06/2023  Medication Sig   buPROPion (WELLBUTRIN XL) 300 MG 24 hr tablet Take 300 mg by mouth in the morning.   Calcium Carb-Cholecalciferol (CALCIUM 600+D3 PO) Take 1 tablet by mouth in the morning and at bedtime.   cholecalciferol (VITAMIN D) 25 MCG (1000 UNIT) tablet Take 1,000 Units by mouth in the morning.   Coenzyme Q10 300 MG CAPS Take 300 mg by mouth in the morning.   Cyanocobalamin (VITAMIN B-12) 2500 MCG SUBL Place 2,500 mcg under the tongue in the morning.   dicyclomine (BENTYL) 10 MG capsule Take 10 mg by mouth 4 (four) times daily -  before meals and at bedtime. As needed   FLUoxetine (PROZAC) 20 MG capsule Take 20 mg by mouth daily.   furosemide (LASIX) 20 MG tablet Take 20 mg by mouth in the morning.   gabapentin (NEURONTIN) 600 MG tablet Take 600 mg by mouth in the morning, at noon, and at bedtime.   Glucosamine-Chondroitin (COSAMIN DS PO) Take 2 tablets by mouth in the morning.   losartan (COZAAR) 100 MG tablet Take 100 mg by mouth in the morning.   Multiple Vitamin (MULTIVITAMIN WITH MINERALS) TABS tablet Take 1 tablet by mouth in the morning. One A Day for Women 50+   Multiple Vitamins-Minerals (MACULAR HEALTH FORMULA PO) Take 1 capsule by mouth in the morning and  at bedtime. Morning & afternoon   naproxen sodium (ALEVE) 220 MG tablet Take 440 mg by mouth 2 (two) times daily as needed (pain.).   Omega-3 Fatty Acids (FISH OIL) 1000 MG CAPS Take 1,000 mg by mouth in the morning.   oxybutynin (DITROPAN-XL) 10 MG 24 hr tablet Take 1 tablet (10 mg total) by mouth at bedtime.   Polyethyl Glycol-Propyl Glycol (LUBRICANT EYE DROPS) 0.4-0.3 % SOLN Place 1-2 drops into both eyes 3 (three) times daily as needed (dry/irritated eyes.).   triamcinolone cream (KENALOG) 0.1 % Apply 1 application. topically daily as needed (ear eczema).   No facility-administered encounter medications on file as of 01/06/2023.    Allergies (verified) Penicillin g, Tetanus immune globulin, and Tetanus toxoids   History: Past Medical History:  Diagnosis Date   Allergy    Anxiety    Arthritis    Cancer (HCC)    right breast lumpectomy   Cataract    Complication of anesthesia    slow to wake up   Depression    GERD (gastroesophageal reflux disease)    Hepatitis    prb A   History of kidney stones    Hypertension    PONV (postoperative nausea and vomiting)    Sleep apnea    cpap   Thyroid disease    Past Surgical History:  Procedure Laterality Date  BREAST SURGERY Right    lumpectomy   chholecystectomy      CHOLECYSTECTOMY  1987   HERNIA REPAIR     umbilical   kn ee surgery  Left 1984   scope   LAPAROSCOPIC SMALL BOWEL RESECTION N/A 01/08/2022   Procedure: LAPAROSCOPIC SMALL BOWEL RESECTION;  Surgeon: Luretha Murphy, MD;  Location: [facility];  Service: General;  Laterality: N/A;   right breast lumpectomy   2015   SMALL INTESTINE SURGERY     divertic of small intestine   UMBILICAL HERNIA REPAIR     Family History  Problem Relation Age of Onset   Arthritis Mother    Miscarriages / [place] Mother    Alzheimer's disease Mother    Cancer Father    Depression Sister    ADD / ADHD Brother    Alcohol abuse Brother    Drug abuse Brother    Alzheimer's disease  Maternal Aunt    Diabetes Maternal Grandmother    Diabetes Paternal Grandmother    Obesity Paternal Grandmother    Social History   Socioeconomic History   Marital status: Married    Spouse name: Not on file   Number of children: 3   Years of education: Not on file   Highest education level: Not on file  Occupational History   Not on file  Tobacco Use   Smoking status: Former    Packs/day: 2.00    Years: 5.00    Additional pack years: 0.00    Total pack years: 10.00    Types: Cigarettes    Quit date: 07/03/1975    Years since quitting: 47.5   Smokeless tobacco: Not on file  Vaping Use   Vaping Use: Never used  Substance and Sexual Activity   Alcohol use: Yes    Alcohol/week: 8.0 - 10.0 standard drinks of alcohol    Types: 8 - 10 Shots of liquor per week    Comment: drink every evening 1-2   Drug use: Not Currently    Types: Marijuana   Sexual activity: Not Currently    Birth control/protection: Abstinence  Other Topics Concern   Not on file  Social History Narrative   [id number]-twins(boy and girl)      Retired.  Religious educator   Social Determinants of Health   Financial Resource Strain: Low Risk  (01/06/2023)   Overall Financial Resource Strain (CARDIA)    Difficulty of Paying Living Expenses: Not hard at all  Food Insecurity: No Food Insecurity (01/06/2023)   Hunger Vital Sign    Worried About Running Out of Food in the Last Year: Never true    Ran Out of Food in the Last Year: Never true  Transportation Needs: No Transportation Needs (01/06/2023)   PRAPARE - Administrator, Civil Service (Medical): No    Lack of Transportation (Non-Medical): No  Physical Activity: Inactive (01/06/2023)   Exercise Vital Sign    Days of Exercise per Week: 0 days    Minutes of Exercise per Session: 0 min  Stress: No Stress Concern Present (01/06/2023)   [employer] of Occupational Health - Occupational Stress Questionnaire    Feeling of Stress : Not at all  Social  Connections: Moderately Integrated (01/06/2023)   Social Connection and Isolation Panel [NHANES]    Frequency of Communication with Friends and Family: More than three times a week    Frequency of Social Gatherings with Friends and Family: More than three times a week    Attends Religious  Services: More than 4 times per year    Active Member of Clubs or Organizations: No    Attends Banker Meetings: Never    Marital Status: Married    Tobacco Counseling Counseling given: Not Answered   Clinical Intake:  Pre-visit preparation completed: Yes  Pain : No/denies pain     BMI - recorded: 40.03 Nutritional Status: BMI > 30  Obese Nutritional Risks: None Diabetes: No  How often do you need to have someone help you when you read instructions, pamphlets, or other written materials from your doctor or pharmacy?: 1 - Never  Diabetic?no  Interpreter Needed?: No  Information entered by :: Lanier Ensign, LPN   Activities of Daily Living    01/06/2023   12:06 PM 01/08/2022    1:31 PM  In your present state of health, do you have any difficulty performing the following activities:  Hearing? 0 0  Vision? 0 0  Difficulty concentrating or making decisions? 0 0  Walking or climbing stairs? 0 0  Dressing or bathing? 0 0  Doing errands, shopping? 0 0  Preparing Food and eating ? N   Using the Toilet? N   In the past six months, have you accidently leaked urine? Y   Comment at times wears pads   Do you have problems with loss of bowel control? N   Managing your Medications? N   Managing your Finances? N   Housekeeping or managing your Housekeeping? N     Patient Care Team: Jeani Sow, MD as PCP - General (Family Medicine)  Indicate any recent Medical Services you may have received from other than [facility] providers in the past year (date may be approximate).     Assessment:   This is a routine wellness examination for Lavenda.  Hearing/Vision screen Hearing  Screening - Comments:: Pt denies any hearing issues  Vision Screening - Comments:: Pt follows up with  [facility] ophthalmology for annual eye exams   Dietary issues and exercise activities discussed: Current Exercise Habits: The patient does not participate in regular exercise at present   Goals Addressed             This Visit's Progress    Patient Stated       More fit        Depression Screen    01/06/2023   12:03 PM 11/29/2022    2:11 PM  PHQ 2/9 Scores  PHQ - 2 Score 0 1  PHQ- 9 Score  2    Fall Risk    01/06/2023   12:06 PM 11/29/2022    2:11 PM  Fall Risk   Falls in the past year? 1 1  Number falls in past yr: 1 0  Injury with Fall? 0 0  Risk for fall due to : Impaired vision;Impaired balance/gait;Impaired mobility Impaired balance/gait  Follow up Falls prevention discussed Falls prevention discussed    FALL RISK PREVENTION PERTAINING TO THE HOME:  Any stairs in or around the home? Yes  If so, are there any without handrails? No  Home free of loose throw rugs in walkways, pet beds, electrical cords, etc? Yes  Adequate lighting in your home to reduce risk of falls? Yes   ASSISTIVE DEVICES UTILIZED TO PREVENT FALLS:  Life alert? No  Use of a cane, walker or w/c? Yes  Grab bars in the bathroom? Yes  Shower chair or bench in shower? Yes  Elevated toilet seat or a handicapped toilet? No   TIMED  UP AND GO:  Was the test performed? No .  Cognitive Function:        01/06/2023   12:07 PM  6CIT Screen  What Year? 0 points  What month? 0 points  What time? 0 points  Count back from 20 0 points  Months in reverse 0 points  Repeat phrase 0 points  Total Score 0 points    Immunizations Immunization History  Administered Date(s) Administered   Influenza Split 06/07/2007, 06/11/2008, 06/04/2009, 04/14/2011, 06/20/2012, 07/03/2013   Influenza, High Dose Seasonal PF 04/21/2021   Influenza,inj,Quad PF,6+ Mos 05/02/2019, 05/06/2020   Influenza,inj,quad, With  Preservative 08/27/2014, 09/01/2015   PFIZER(Purple Top)SARS-COV-2 Vaccination 10/01/2019, 10/30/2019   Pneumococcal Conjugate-13 11/01/2016   Pneumococcal Polysaccharide-23 11/04/2017   Zoster, Live 09/02/2020    TDAP status: Due, Education has been provided regarding the importance of this vaccine. Advised may receive this vaccine at local pharmacy or Health Dept. Aware to provide a copy of the vaccination record if obtained from local pharmacy or Health Dept. Verbalized acceptance and understanding.  Flu Vaccine status: Due, Education has been provided regarding the importance of this vaccine. Advised may receive this vaccine at local pharmacy or Health Dept. Aware to provide a copy of the vaccination record if obtained from local pharmacy or Health Dept. Verbalized acceptance and understanding.  Pneumococcal vaccine status: Up to date  Covid-19 vaccine status: Completed vaccines  Qualifies for Shingles Vaccine? Yes   Zostavax completed No   Shingrix Completed?: No.    Education has been provided regarding the importance of this vaccine. Patient has been advised to call insurance company to determine out of pocket expense if they have not yet received this vaccine. Advised may also receive vaccine at local pharmacy or Health Dept. Verbalized acceptance and understanding.  Screening Tests Health Maintenance  Topic Date Due   Hepatitis C Screening  Never done   DEXA SCAN  Never done   COVID-19 Vaccine (3 - Pfizer risk series) 03/13/2023 (Originally 11/27/2019)   Zoster Vaccines- Shingrix (1 of 2) 03/20/2023 (Originally 10/25/1969)   INFLUENZA VACCINE  03/03/2023   Medicare Annual Wellness (AWV)  01/06/2024   MAMMOGRAM  01/27/2024   Colonoscopy  08/31/2026   Pneumonia Vaccine 52+ Years old  Completed   HPV VACCINES  Aged Out   DTaP/Tdap/Td  Discontinued    Health Maintenance  Health Maintenance Due  Topic Date Due   Hepatitis C Screening  Never done   DEXA SCAN  Never done     Colorectal cancer screening: Type of screening: Colonoscopy. Completed 08/31/16. Repeat every due now  years  Mammogram status: Completed 01/26/22. Repeat every year  Pt stated dexa scan and mammogram scheduled for 01/2023   Additional Screening:  Hepatitis C Screening: does qualify  Vision Screening: Recommended annual ophthalmology exams for early detection of glaucoma and other disorders of the eye. Is the patient up to date with their annual eye exam?  Yes  Who is the provider or what is the name of the office in which the patient attends annual eye exams? [facility]  If pt is not established with a provider, would they like to be referred to a provider to establish care? No .   Dental Screening: Recommended annual dental exams for proper oral hygiene  Community Resource Referral / Chronic Care Management: CRR required this visit?  No   CCM required this visit?  No      Plan:     I have personally reviewed and noted the  following in the patient's chart:   Medical and social history Use of alcohol, tobacco or illicit drugs  Current medications and supplements including opioid prescriptions. Patient is not currently taking opioid prescriptions. Functional ability and status Nutritional status Physical activity Advanced directives List of other physicians Hospitalizations, surgeries, and ER visits in previous 12 months Vitals Screenings to include cognitive, depression, and falls Referrals and appointments  In addition, I have reviewed and discussed with patient certain preventive protocols, quality metrics, and best practice recommendations. A written personalized care plan for preventive services as well as general preventive health recommendations were provided to patient.     Marzella Schlein, LPN   09/10/5186   Nurse Notes: pt stated she needs to have a colonoscopy scheduled due to MRI recently had, stated she has called the office unsure of who she spoke to ,  However no one has responded please advise

## 2023-01-10 NOTE — Patient Instructions (Signed)
Maria Wang , Thank you for taking time to come for your Medicare Wellness Visit. I appreciate your ongoing commitment to your health goals. Please review the following plan we discussed and let me know if I can assist you in the future.   These are the goals we discussed:  Goals      Patient Stated     More fit         This is a list of the screening recommended for you and due dates:  Health Maintenance  Topic Date Due   Hepatitis C Screening  Never done   DEXA scan (bone density measurement)  Never done   COVID-19 Vaccine (3 - Pfizer risk series) 03/13/2023*   Zoster (Shingles) Vaccine (1 of 2) 03/20/2023*   Flu Shot  03/03/2023   Medicare Annual Wellness Visit  01/06/2024   Mammogram  01/27/2024   Colon Cancer Screening  08/31/2026   Pneumonia Vaccine  Completed   HPV Vaccine  Aged Out   DTaP/Tdap/Td vaccine  Discontinued  *Topic was postponed. The date shown is not the original due date.    Advanced directives: Advance directive discussed with you today. Even though you declined this today please call our office should you change your mind and we can give you the proper paperwork for you to fill out.  Conditions/risks identified: more fit   Next appointment: Follow up in one year for your annual wellness visit    Preventive Care 65 Years and Older, Female Preventive care refers to lifestyle choices and visits with your health care provider that can promote health and wellness. What does preventive care include? A yearly physical exam. This is also called an annual well check. Dental exams once or twice a year. Routine eye exams. Ask your health care provider how often you should have your eyes checked. Personal lifestyle choices, including: Daily care of your teeth and gums. Regular physical activity. Eating a healthy diet. Avoiding tobacco and drug use. Limiting alcohol use. Practicing safe sex. Taking low-dose aspirin every day. Taking vitamin and mineral  supplements as recommended by your health care provider. What happens during an annual well check? The services and screenings done by your health care provider during your annual well check will depend on your age, overall health, lifestyle risk factors, and family history of disease. Counseling  Your health care provider may ask you questions about your: Alcohol use. Tobacco use. Drug use. Emotional well-being. Home and relationship well-being. Sexual activity. Eating habits. History of falls. Memory and ability to understand (cognition). Work and work Astronomer. Reproductive health. Screening  You may have the following tests or measurements: Height, weight, and BMI. Blood pressure. Lipid and cholesterol levels. These may be checked every 5 years, or more frequently if you are over 64 years old. Skin check. Lung cancer screening. You may have this screening every year starting at age 91 if you have a 30-pack-year history of smoking and currently smoke or have quit within the past 15 years. Fecal occult blood test (FOBT) of the stool. You may have this test every year starting at age 67. Flexible sigmoidoscopy or colonoscopy. You may have a sigmoidoscopy every 5 years or a colonoscopy every 10 years starting at age 48. Hepatitis C blood test. Hepatitis B blood test. Sexually transmitted disease (STD) testing. Diabetes screening. This is done by checking your blood sugar (glucose) after you have not eaten for a while (fasting). You may have this done every 1-3 years. Bone density scan.  This is done to screen for osteoporosis. You may have this done starting at age 89. Mammogram. This may be done every 1-2 years. Talk to your health care provider about how often you should have regular mammograms. Talk with your health care provider about your test results, treatment options, and if necessary, the need for more tests. Vaccines  Your health care provider may recommend certain  vaccines, such as: Influenza vaccine. This is recommended every year. Tetanus, diphtheria, and acellular pertussis (Tdap, Td) vaccine. You may need a Td booster every 10 years. Zoster vaccine. You may need this after age 21. Pneumococcal 13-valent conjugate (PCV13) vaccine. One dose is recommended after age 1. Pneumococcal polysaccharide (PPSV23) vaccine. One dose is recommended after age 44. Talk to your health care provider about which screenings and vaccines you need and how often you need them. This information is not intended to replace advice given to you by your health care provider. Make sure you discuss any questions you have with your health care provider. Document Released: 08/15/2015 Document Revised: 04/07/2016 Document Reviewed: 05/20/2015 Elsevier Interactive Patient Education  2017 [employer].  Fall Prevention in the Home Falls can cause injuries. They can happen to people of all ages. There are many things you can do to make your home safe and to help prevent falls. What can I do on the outside of my home? Regularly fix the edges of walkways and driveways and fix any cracks. Remove anything that might make you trip as you walk through a door, such as a raised step or threshold. Trim any bushes or trees on the path to your home. Use bright outdoor lighting. Clear any walking paths of anything that might make someone trip, such as rocks or tools. Regularly check to see if handrails are loose or broken. Make sure that both sides of any steps have handrails. Any raised decks and porches should have guardrails on the edges. Have any leaves, snow, or ice cleared regularly. Use sand or salt on walking paths during winter. Clean up any spills in your garage right away. This includes oil or grease spills. What can I do in the bathroom? Use night lights. Install grab bars by the toilet and in the tub and shower. Do not use towel bars as grab bars. Use non-skid mats or decals in  the tub or shower. If you need to sit down in the shower, use a plastic, non-slip stool. Keep the floor dry. Clean up any water that spills on the floor as soon as it happens. Remove soap buildup in the tub or shower regularly. Attach bath mats securely with double-sided non-slip rug tape. Do not have throw rugs and other things on the floor that can make you trip. What can I do in the bedroom? Use night lights. Make sure that you have a light by your bed that is easy to reach. Do not use any sheets or blankets that are too big for your bed. They should not hang down onto the floor. Have a firm chair that has side arms. You can use this for support while you get dressed. Do not have throw rugs and other things on the floor that can make you trip. What can I do in the kitchen? Clean up any spills right away. Avoid walking on wet floors. Keep items that you use a lot in easy-to-reach places. If you need to reach something above you, use a strong step stool that has a grab bar. Keep electrical cords  out of the way. Do not use floor polish or wax that makes floors slippery. If you must use wax, use non-skid floor wax. Do not have throw rugs and other things on the floor that can make you trip. What can I do with my stairs? Do not leave any items on the stairs. Make sure that there are handrails on both sides of the stairs and use them. Fix handrails that are broken or loose. Make sure that handrails are as long as the stairways. Check any carpeting to make sure that it is firmly attached to the stairs. Fix any carpet that is loose or worn. Avoid having throw rugs at the top or bottom of the stairs. If you do have throw rugs, attach them to the floor with carpet tape. Make sure that you have a light switch at the top of the stairs and the bottom of the stairs. If you do not have them, ask someone to add them for you. What else can I do to help prevent falls? Wear shoes that: Do not have high  heels. Have rubber bottoms. Are comfortable and fit you well. Are closed at the toe. Do not wear sandals. If you use a stepladder: Make sure that it is fully opened. Do not climb a closed stepladder. Make sure that both sides of the stepladder are locked into place. Ask someone to hold it for you, if possible. Clearly mark and make sure that you can see: Any grab bars or handrails. First and last steps. Where the edge of each step is. Use tools that help you move around (mobility aids) if they are needed. These include: Canes. Walkers. Scooters. Crutches. Turn on the lights when you go into a dark area. Replace any light bulbs as soon as they burn out. Set up your furniture so you have a clear path. Avoid moving your furniture around. If any of your floors are uneven, fix them. If there are any pets around you, be aware of where they are. Review your medicines with your doctor. Some medicines can make you feel dizzy. This can increase your chance of falling. Ask your doctor what other things that you can do to help prevent falls. This information is not intended to replace advice given to you by your health care provider. Make sure you discuss any questions you have with your health care provider. Document Released: 05/15/2009 Document Revised: 12/25/2015 Document Reviewed: 08/23/2014 Elsevier Interactive Patient Education  2017 [employer].

## 2023-01-10 NOTE — Progress Notes (Signed)
I connected with  Maria Wang on 01/10/23 by a audio enabled telemedicine application and verified that I am speaking with the correct person using two identifiers.  Patient Location: Home  Provider Location: Office/Clinic  I discussed the limitations of evaluation and management by telemedicine. The patient expressed understanding and agreed to proceed.   Subjective:   Maria Wang is a 72 y.o. female who presents for Medicare Annual (Subsequent) preventive examination.  Review of Systems     Cardiac Risk Factors include: advanced age (>22men, >60 women);hypertension;obesity (BMI >30kg/m2);dyslipidemia     Objective:    Today's Vitals   01/06/23 1159  Weight: 226 lb (102.5 kg)   Body mass index is 40.03 kg/m.     01/06/2023   12:05 PM 01/08/2022    1:31 PM 12/31/2021    9:11 AM  Advanced Directives  Does Patient Have a Medical Advance Directive? No No No  Would patient like information on creating a medical advance directive? No - Patient declined No - Patient declined     Current Medications (verified) Outpatient Encounter Medications as of 01/06/2023  Medication Sig   buPROPion (WELLBUTRIN XL) 300 MG 24 hr tablet Take 300 mg by mouth in the morning.   Calcium Carb-Cholecalciferol (CALCIUM 600+D3 PO) Take 1 tablet by mouth in the morning and at bedtime.   cholecalciferol (VITAMIN D) 25 MCG (1000 UNIT) tablet Take 1,000 Units by mouth in the morning.   Coenzyme Q10 300 MG CAPS Take 300 mg by mouth in the morning.   Cyanocobalamin (VITAMIN B-12) 2500 MCG SUBL Place 2,500 mcg under the tongue in the morning.   dicyclomine (BENTYL) 10 MG capsule Take 10 mg by mouth 4 (four) times daily -  before meals and at bedtime. As needed   FLUoxetine (PROZAC) 20 MG capsule Take 20 mg by mouth daily.   furosemide (LASIX) 20 MG tablet Take 20 mg by mouth in the morning.   gabapentin (NEURONTIN) 600 MG tablet Take 600 mg by mouth in the morning, at noon, and at bedtime.    Glucosamine-Chondroitin (COSAMIN DS PO) Take 2 tablets by mouth in the morning.   losartan (COZAAR) 100 MG tablet Take 100 mg by mouth in the morning.   Multiple Vitamin (MULTIVITAMIN WITH MINERALS) TABS tablet Take 1 tablet by mouth in the morning. One A Day for Women 50+   Multiple Vitamins-Minerals (MACULAR HEALTH FORMULA PO) Take 1 capsule by mouth in the morning and at bedtime. Morning & afternoon   naproxen sodium (ALEVE) 220 MG tablet Take 440 mg by mouth 2 (two) times daily as needed (pain.).   Omega-3 Fatty Acids (FISH OIL) 1000 MG CAPS Take 1,000 mg by mouth in the morning.   oxybutynin (DITROPAN-XL) 10 MG 24 hr tablet Take 1 tablet (10 mg total) by mouth at bedtime.   Polyethyl Glycol-Propyl Glycol (LUBRICANT EYE DROPS) 0.4-0.3 % SOLN Place 1-2 drops into both eyes 3 (three) times daily as needed (dry/irritated eyes.).   triamcinolone cream (KENALOG) 0.1 % Apply 1 application. topically daily as needed (ear eczema).   No facility-administered encounter medications on file as of 01/06/2023.    Allergies (verified) Penicillin g, Tetanus immune globulin, and Tetanus toxoids   History: Past Medical History:  Diagnosis Date   Allergy    Anxiety    Arthritis    Cancer (HCC)    right breast lumpectomy   Cataract    Complication of anesthesia    slow to wake up   Depression  GERD (gastroesophageal reflux disease)    Hepatitis    prb A   History of kidney stones    Hypertension    PONV (postoperative nausea and vomiting)    Sleep apnea    cpap   Thyroid disease    Past Surgical History:  Procedure Laterality Date   BREAST SURGERY Right    lumpectomy   chholecystectomy      CHOLECYSTECTOMY  1987   HERNIA REPAIR     umbilical   kn ee surgery  Left 1984   scope   LAPAROSCOPIC SMALL BOWEL RESECTION N/A 01/08/2022   Procedure: LAPAROSCOPIC SMALL BOWEL RESECTION;  Surgeon: Luretha Murphy, MD;  Location: [facility];  Service: General;  Laterality: N/A;   right breast  lumpectomy   2015   SMALL INTESTINE SURGERY     divertic of small intestine   UMBILICAL HERNIA REPAIR     Family History  Problem Relation Age of Onset   Arthritis Mother    Miscarriages / [place] Mother    Alzheimer's disease Mother    Cancer Father    Depression Sister    ADD / ADHD Brother    Alcohol abuse Brother    Drug abuse Brother    Alzheimer's disease Maternal Aunt    Diabetes Maternal Grandmother    Diabetes Paternal Grandmother    Obesity Paternal Grandmother    Social History   Socioeconomic History   Marital status: Married    Spouse name: Not on file   Number of children: 3   Years of education: Not on file   Highest education level: Not on file  Occupational History   Not on file  Tobacco Use   Smoking status: Former    Packs/day: 2.00    Years: 5.00    Additional pack years: 0.00    Total pack years: 10.00    Types: Cigarettes    Quit date: 07/03/1975    Years since quitting: 47.5   Smokeless tobacco: Not on file  Vaping Use   Vaping Use: Never used  Substance and Sexual Activity   Alcohol use: Yes    Alcohol/week: 8.0 - 10.0 standard drinks of alcohol    Types: 8 - 10 Shots of liquor per week    Comment: drink every evening 1-2   Drug use: Not Currently    Types: Marijuana   Sexual activity: Not Currently    Birth control/protection: Abstinence  Other Topics Concern   Not on file  Social History Narrative   [id number]-twins(boy and girl)      Retired.  Religious educator   Social Determinants of Health   Financial Resource Strain: Low Risk  (01/06/2023)   Overall Financial Resource Strain (CARDIA)    Difficulty of Paying Living Expenses: Not hard at all  Food Insecurity: No Food Insecurity (01/06/2023)   Hunger Vital Sign    Worried About Running Out of Food in the Last Year: Never true    Ran Out of Food in the Last Year: Never true  Transportation Needs: No Transportation Needs (01/06/2023)   PRAPARE - Scientist, research (physical sciences) (Medical): No    Lack of Transportation (Non-Medical): No  Physical Activity: Inactive (01/06/2023)   Exercise Vital Sign    Days of Exercise per Week: 0 days    Minutes of Exercise per Session: 0 min  Stress: No Stress Concern Present (01/06/2023)   [employer] of Occupational Health - Occupational Stress Questionnaire    Feeling of Stress :  Not at all  Social Connections: Moderately Integrated (01/06/2023)   Social Connection and Isolation Panel [NHANES]    Frequency of Communication with Friends and Family: More than three times a week    Frequency of Social Gatherings with Friends and Family: More than three times a week    Attends Religious Services: More than 4 times per year    Active Member of [employer] or Organizations: No    Attends Engineer, structural: Never    Marital Status: Married    Tobacco Counseling Counseling given: Not Answered   Clinical Intake:  Pre-visit preparation completed: Yes  Pain : No/denies pain     BMI - recorded: 40.03 Nutritional Status: BMI > 30  Obese Nutritional Risks: None Diabetes: No  How often do you need to have someone help you when you read instructions, pamphlets, or other written materials from your doctor or pharmacy?: 1 - Never  Diabetic?no  Interpreter Needed?: No  Information entered by :: Lanier Ensign, LPN   Activities of Daily Living    01/06/2023   12:06 PM  In your present state of health, do you have any difficulty performing the following activities:  Hearing? 0  Vision? 0  Difficulty concentrating or making decisions? 0  Walking or climbing stairs? 0  Dressing or bathing? 0  Doing errands, shopping? 0  Preparing Food and eating ? N  Using the Toilet? N  In the past six months, have you accidently leaked urine? Y  Comment at times wears pads  Do you have problems with loss of bowel control? N  Managing your Medications? N  Managing your Finances? N  Housekeeping or managing your  Housekeeping? N    Patient Care Team: Jeani Sow, MD as PCP - General (Family Medicine)  Indicate any recent Medical Services you may have received from other than [facility] providers in the past year (date may be approximate).     Assessment:   This is a routine wellness examination for Maria Wang.  Hearing/Vision screen Hearing Screening - Comments:: Pt denies any hearing issues  Vision Screening - Comments:: Pt follows up with  [facility] ophthalmology for annual eye exams   Dietary issues and exercise activities discussed: Current Exercise Habits: The patient does not participate in regular exercise at present   Goals Addressed             This Visit's Progress    Patient Stated       More fit       Depression Screen    01/06/2023   12:03 PM 11/29/2022    2:11 PM  PHQ 2/9 Scores  PHQ - 2 Score 0 1  PHQ- 9 Score  2    Fall Risk    01/06/2023   12:06 PM 11/29/2022    2:11 PM  Fall Risk   Falls in the past year? 1 1  Number falls in past yr: 1 0  Injury with Fall? 0 0  Risk for fall due to : Impaired vision;Impaired balance/gait;Impaired mobility Impaired balance/gait  Follow up Falls prevention discussed Falls prevention discussed    FALL RISK PREVENTION PERTAINING TO THE HOME:  Any stairs in or around the home? Yes  If so, are there any without handrails? No  Home free of loose throw rugs in walkways, pet beds, electrical cords, etc? Yes  Adequate lighting in your home to reduce risk of falls? Yes   ASSISTIVE DEVICES UTILIZED TO PREVENT FALLS:  Life alert? No  Use  of a cane, walker or w/c? Yes  Grab bars in the bathroom? Yes  Shower chair or bench in shower? Yes  Elevated toilet seat or a handicapped toilet? No   TIMED UP AND GO:  Was the test performed? No .  Cognitive Function:        01/06/2023   12:07 PM  6CIT Screen  What Year? 0 points  What month? 0 points  What time? 0 points  Count back from 20 0 points  Months in reverse 0 points  Repeat  phrase 0 points  Total Score 0 points    Immunizations Immunization History  Administered Date(s) Administered   Influenza Split 06/07/2007, 06/11/2008, 06/04/2009, 04/14/2011, 06/20/2012, 07/03/2013   Influenza, High Dose Seasonal PF 04/21/2021   Influenza,inj,Quad PF,6+ Mos 05/02/2019, 05/06/2020   Influenza,inj,quad, With Preservative 08/27/2014, 09/01/2015   PFIZER(Purple Top)SARS-COV-2 Vaccination 10/01/2019, 10/30/2019   Pneumococcal Conjugate-13 11/01/2016   Pneumococcal Polysaccharide-23 11/04/2017   Zoster, Live 09/02/2020    TDAP status: Due, Education has been provided regarding the importance of this vaccine. Advised may receive this vaccine at local pharmacy or Health Dept. Aware to provide a copy of the vaccination record if obtained from local pharmacy or Health Dept. Verbalized acceptance and understanding.  Flu Vaccine status: Due, Education has been provided regarding the importance of this vaccine. Advised may receive this vaccine at local pharmacy or Health Dept. Aware to provide a copy of the vaccination record if obtained from local pharmacy or Health Dept. Verbalized acceptance and understanding.  Pneumococcal vaccine status: Up to date  Covid-19 vaccine status: Completed vaccines  Qualifies for Shingles Vaccine? Yes   Zostavax completed No   Shingrix Completed?: No.    Education has been provided regarding the importance of this vaccine. Patient has been advised to call insurance company to determine out of pocket expense if they have not yet received this vaccine. Advised may also receive vaccine at local pharmacy or Health Dept. Verbalized acceptance and understanding.  Screening Tests Health Maintenance  Topic Date Due   Hepatitis C Screening  Never done   DEXA SCAN  Never done   COVID-19 Vaccine (3 - Pfizer risk series) 03/13/2023 (Originally 11/27/2019)   Zoster Vaccines- Shingrix (1 of 2) 03/20/2023 (Originally 10/25/1969)   INFLUENZA VACCINE   03/03/2023   Medicare Annual Wellness (AWV)  01/06/2024   MAMMOGRAM  01/27/2024   Colonoscopy  08/31/2026   Pneumonia Vaccine 61+ Years old  Completed   HPV VACCINES  Aged Out   DTaP/Tdap/Td  Discontinued    Health Maintenance  Health Maintenance Due  Topic Date Due   Hepatitis C Screening  Never done   DEXA SCAN  Never done    Colorectal cancer screening: Type of screening: Colonoscopy. Completed 08/31/16. Repeat every due now  years  Mammogram status: Completed 01/26/22. Repeat every year  Pt stated dexa scan and mammogram scheduled for 01/2023   Additional Screening:  Hepatitis C Screening: does qualify  Vision Screening: Recommended annual ophthalmology exams for early detection of glaucoma and other disorders of the eye. Is the patient up to date with their annual eye exam?  Yes  Who is the provider or what is the name of the office in which the patient attends annual eye exams? [facility]  If pt is not established with a provider, would they like to be referred to a provider to establish care? No .   Dental Screening: Recommended annual dental exams for proper oral hygiene  Community Resource Referral /  Chronic Care Management: CRR required this visit?  No   CCM required this visit?  No      Plan:     I have personally reviewed and noted the following in the patient's chart:   Medical and social history Use of alcohol, tobacco or illicit drugs  Current medications and supplements including opioid prescriptions. Patient is not currently taking opioid prescriptions. Functional ability and status Nutritional status Physical activity Advanced directives List of other physicians Hospitalizations, surgeries, and ER visits in previous 12 months Vitals Screenings to include cognitive, depression, and falls Referrals and appointments  In addition, I have reviewed and discussed with patient certain preventive protocols, quality metrics, and best practice  recommendations. A written personalized care plan for preventive services as well as general preventive health recommendations were provided to patient.     Marzella Schlein, LPN   1/61/0960   Nurse Notes: pt stated she needs to have a colonoscopy scheduled due to MRI recently had, stated she has called the office unsure of who she spoke to , However no one has responded please advise

## 2023-01-12 ENCOUNTER — Telehealth: Payer: Self-pay | Admitting: *Deleted

## 2023-01-12 NOTE — Telephone Encounter (Signed)
Called patient to inform her that after research from the provider, it look like she was seen by Dr. Ewing Schlein, GI. Patient stated name was on her list so she may have gotten colonoscopy done at his office: [facility]. I call gi office and they did have a record of her last colonoscopy in 2018. I faxed over form requesting those records.

## 2023-02-01 DIAGNOSIS — Z1231 Encounter for screening mammogram for malignant neoplasm of breast: Secondary | ICD-10-CM | POA: Diagnosis not present

## 2023-02-01 DIAGNOSIS — Z8262 Family history of osteoporosis: Secondary | ICD-10-CM | POA: Diagnosis not present

## 2023-02-01 DIAGNOSIS — Z853 Personal history of malignant neoplasm of breast: Secondary | ICD-10-CM | POA: Diagnosis not present

## 2023-02-01 DIAGNOSIS — M8588 Other specified disorders of bone density and structure, other site: Secondary | ICD-10-CM | POA: Diagnosis not present

## 2023-02-01 LAB — HM DEXA SCAN

## 2023-02-01 LAB — HM MAMMOGRAPHY

## 2023-03-28 ENCOUNTER — Other Ambulatory Visit: Payer: Self-pay | Admitting: Family Medicine

## 2023-03-28 DIAGNOSIS — F3341 Major depressive disorder, recurrent, in partial remission: Secondary | ICD-10-CM

## 2023-03-29 ENCOUNTER — Other Ambulatory Visit: Payer: Self-pay | Admitting: Family Medicine

## 2023-03-29 DIAGNOSIS — F3341 Major depressive disorder, recurrent, in partial remission: Secondary | ICD-10-CM

## 2023-04-16 IMAGING — CT CT ABD-PELV W/ CM
2 of 5 series · 16 of 46 positions shown, 18 images · IV contrast (APPLIED)
Comparison: 06/20/2012

CLINICAL DATA: Abdominal pain and nausea. Intermittent diarrhea.
Right mid back pain. Hypertension. Diverticulosis. * Tracking Code:
BO *

EXAM:
CT ABDOMEN AND PELVIS WITH CONTRAST
TECHNIQUE: Multidetector CT imaging of the abdomen and pelvis was performed
using the standard protocol following bolus administration of
intravenous contrast.

[Series 2: abd pel w · axial · 0.97mm/px · z∈[+836,+1231]mm · 13 of 89 slices shown, 15 images]
[im 5/89  soft-tissue]
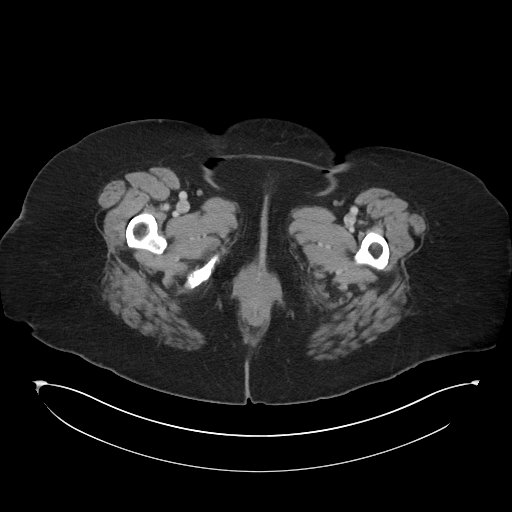
[im 5/89  bone]
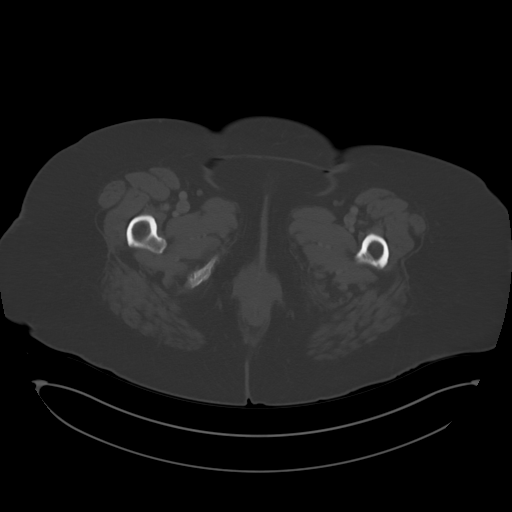
[im 10/89  soft-tissue]
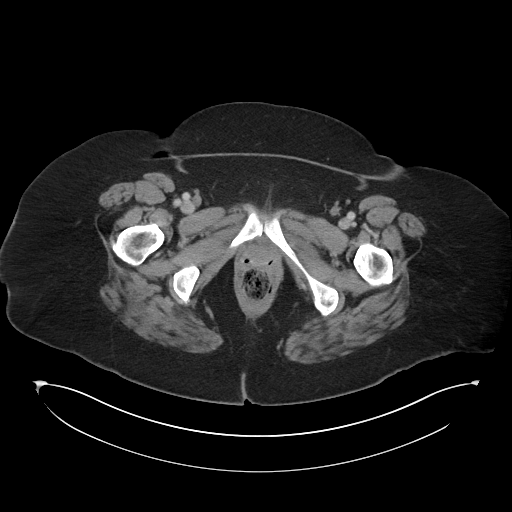
[im 20/89  soft-tissue]
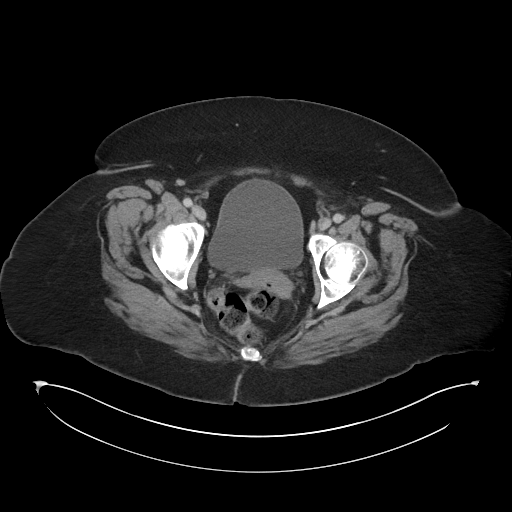
[im 25/89  soft-tissue]
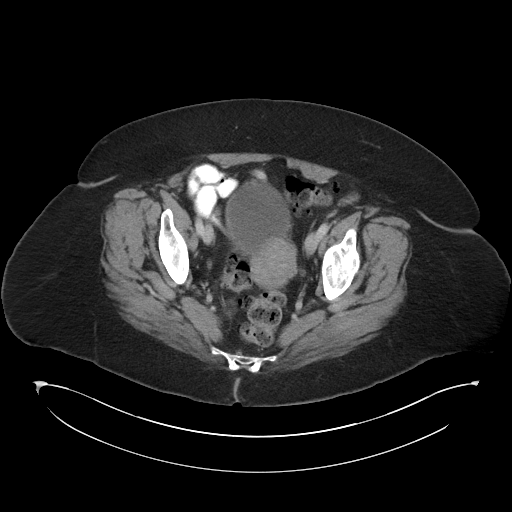
[im 30/89  soft-tissue]
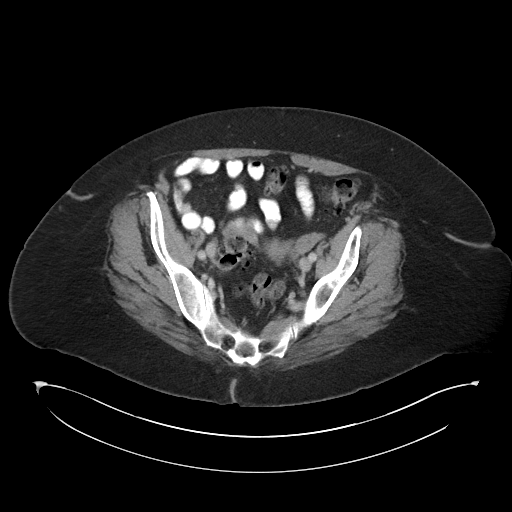
[im 40/89  soft-tissue]
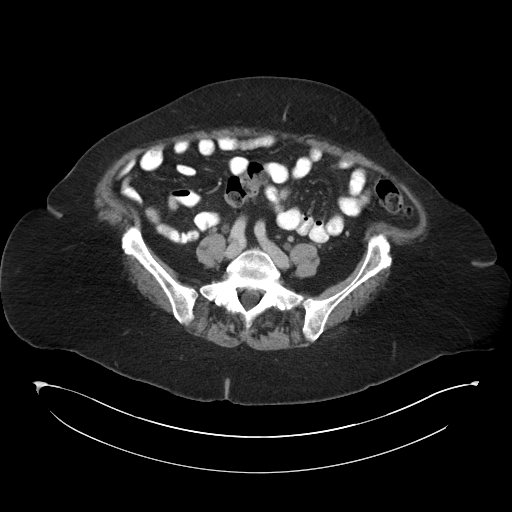
[im 45/89  soft-tissue]
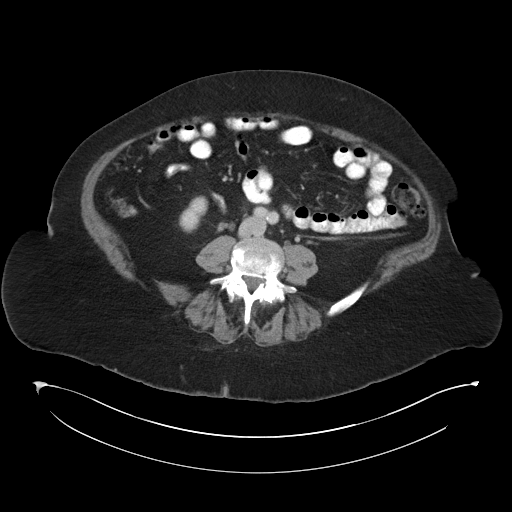
[im 49/89  soft-tissue]
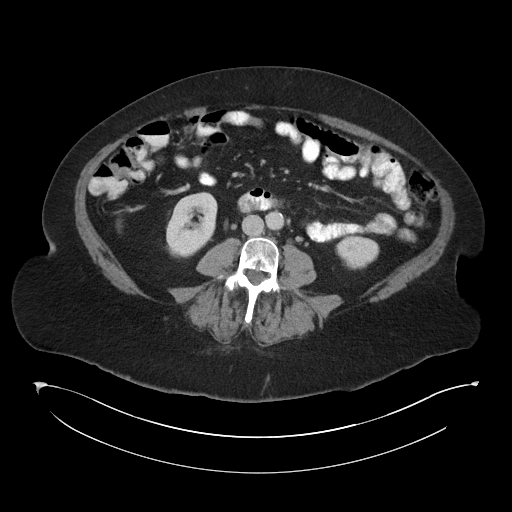
[im 59/89  soft-tissue]
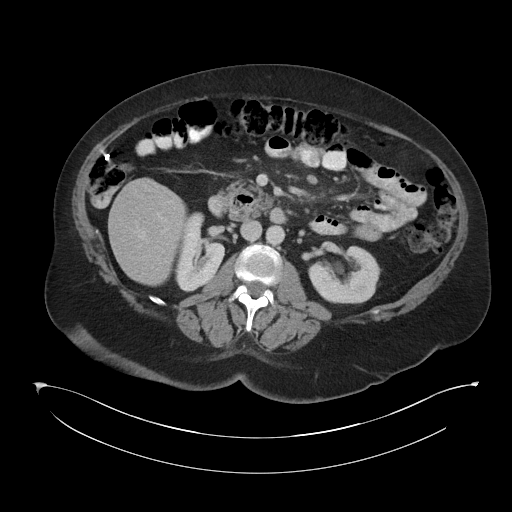
[im 59/89  bone]
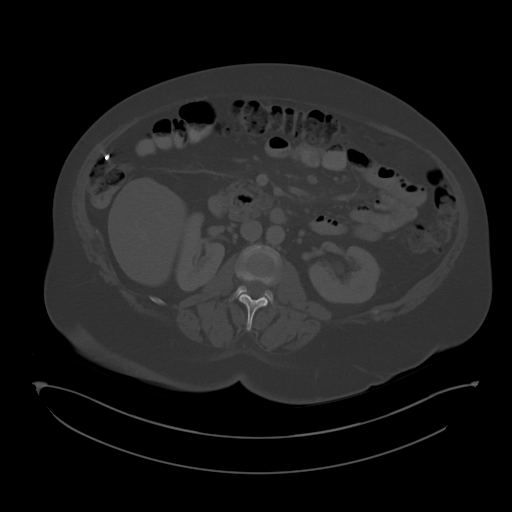
[im 64/89  soft-tissue]
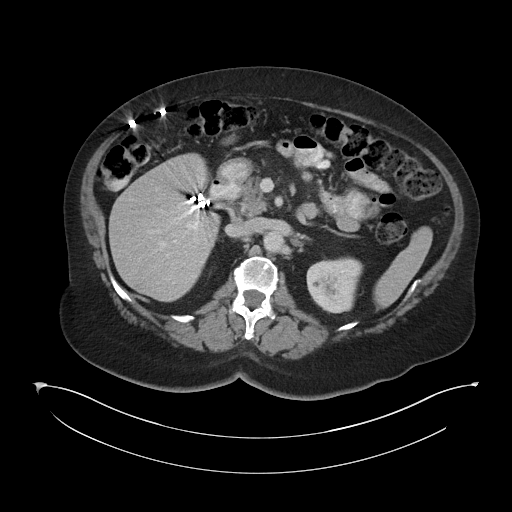
[im 69/89  soft-tissue]
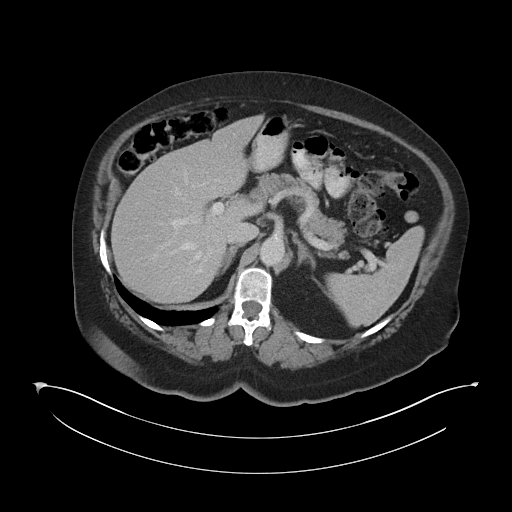
[im 79/89  soft-tissue]
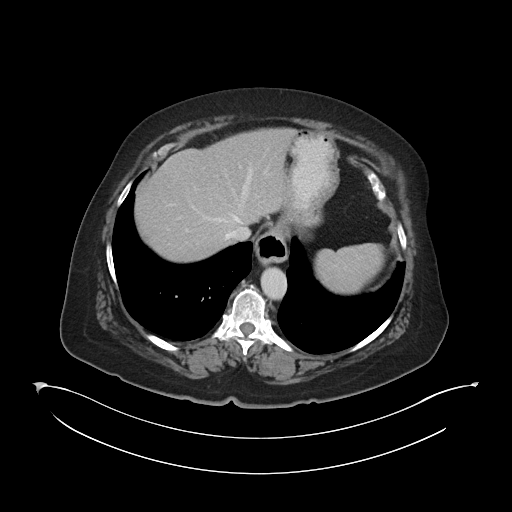
[im 84/89  soft-tissue]
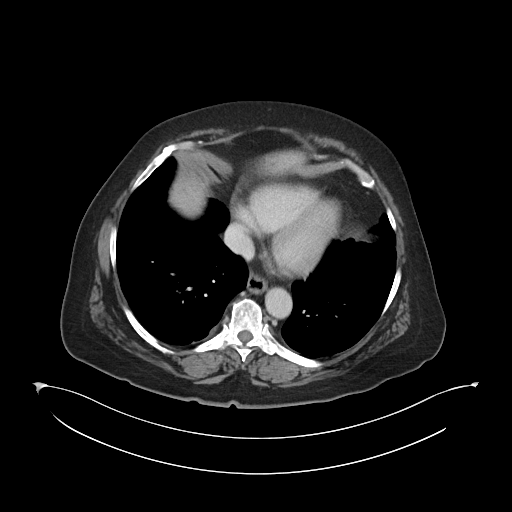

[Series 5: coronal · coronal · 0.87mm/px · 3 of 117 slices shown]
[im 39/117  soft-tissue]
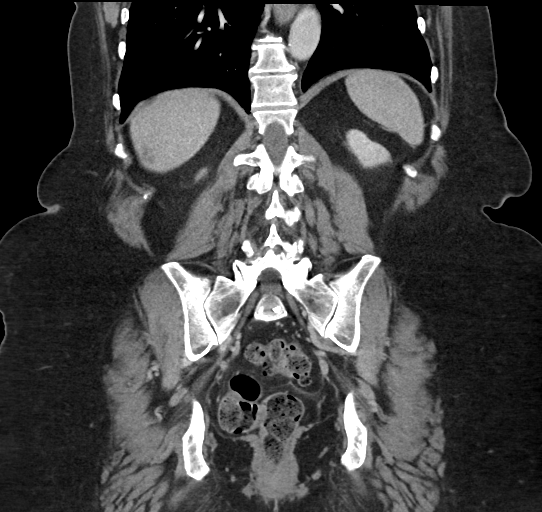
[im 52/117  soft-tissue]
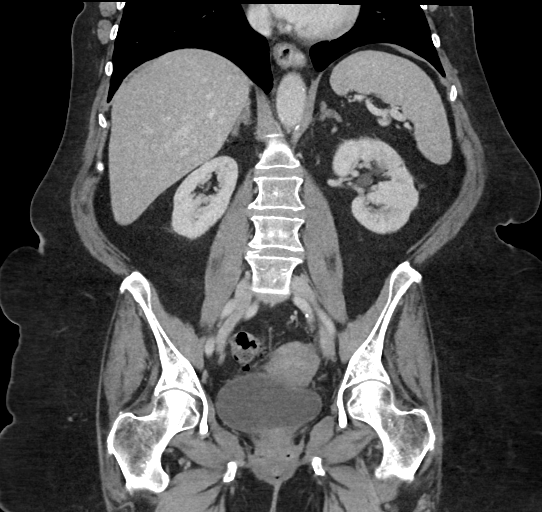
[im 65/117  soft-tissue]
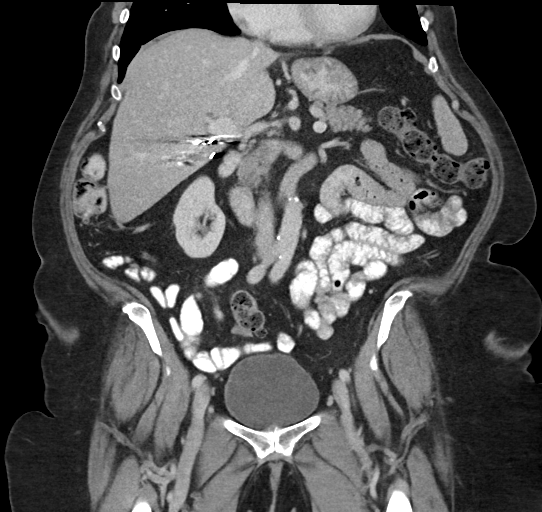

[16 of 46 positions shown; findings below may reference images not displayed]

RADIATION DOSE REDUCTION: This exam was performed according to the
departmental dose-optimization program which includes automated
exposure control, adjustment of the mA and/or kV according to
patient size and/or use of iterative reconstruction technique.

CONTRAST:  100mL OMNIPAQUE IOHEXOL 300 MG/ML  SOLN
FINDINGS: Lower chest: Clear lung bases. Mild cardiomegaly, without
pericardial or pleural effusion. Tiny hiatal hernia.

Hepatobiliary: 11 mm hypoattenuating right hepatic lobe subcapsular
lesion on [DATE].

Focal steatosis adjacent the falciform ligament.

Cholecystectomy, without biliary ductal dilatation.

Pancreas: Normal, without mass or ductal dilatation.

Spleen: Normal in size, without focal abnormality.

Adrenals/Urinary Tract: Normal adrenal glands. Normal right kidney.
Left renal too small to characterize lesions . No follow-up
necessary. No hydronephrosis. Normal urinary bladder.

Stomach/Bowel: Normal stomach, without wall thickening. Extensive
colonic diverticulosis. Normal colon and terminal ileum.
Periampullary duodenal diverticulum.

Involving the mid jejunum is a short segment of moderate small bowel
wall thickening with an exophytic, either intramural or perienteric
soft tissue mass at 5.7 x 4.2 cm on 36/2. 2.9 cm craniocaudal on
coronal image 80.

No bowel obstruction.

Vascular/Lymphatic: Aortic atherosclerosis. Jejunal mesenteric
adenopathy is mild, new since 3598. Example 9 mm node on 34/2. No
pelvic sidewall adenopathy.

Reproductive: Normal uterus and adnexa.

Other: No significant free fluid. Pelvic floor laxity. No free
intraperitoneal air.

Musculoskeletal: Bilateral acetabular bone islands.
IMPRESSION: 1. Jejunal mass with surrounding more diffuse soft tissue
thickening. Regional mesenteric adenopathy. Given location and lack
of bowel obstruction, lymphoma or GI stromal tumor favored.
Adenocarcinoma felt less likely.
2. Nonspecific right hepatic lobe 11 mm hypoattenuating lesion.
Otherwise, no evidence of distant metastatic disease within the
abdomen or pelvis.
3.  Tiny hiatal hernia.
4.  Aortic Atherosclerosis ([id number]).

These results will be called to the ordering clinician or
representative by the Radiologist Assistant, and communication
documented in the PACS or [REDACTED].

## 2023-04-18 DIAGNOSIS — M170 Bilateral primary osteoarthritis of knee: Secondary | ICD-10-CM | POA: Diagnosis not present

## 2023-05-31 ENCOUNTER — Ambulatory Visit: Payer: PPO | Admitting: Family Medicine

## 2023-05-31 ENCOUNTER — Encounter: Payer: Self-pay | Admitting: Family Medicine

## 2023-05-31 VITALS — BP 134/86 | HR 58 | Wt 233.9 lb

## 2023-05-31 DIAGNOSIS — M810 Age-related osteoporosis without current pathological fracture: Secondary | ICD-10-CM | POA: Diagnosis not present

## 2023-05-31 DIAGNOSIS — I10 Essential (primary) hypertension: Secondary | ICD-10-CM

## 2023-05-31 DIAGNOSIS — F3341 Major depressive disorder, recurrent, in partial remission: Secondary | ICD-10-CM

## 2023-05-31 DIAGNOSIS — R739 Hyperglycemia, unspecified: Secondary | ICD-10-CM | POA: Diagnosis not present

## 2023-05-31 DIAGNOSIS — Z1159 Encounter for screening for other viral diseases: Secondary | ICD-10-CM | POA: Diagnosis not present

## 2023-05-31 LAB — COMPREHENSIVE METABOLIC PANEL
ALT: 21 U/L (ref 0–35)
AST: 18 U/L (ref 0–37)
Albumin: 4.1 g/dL (ref 3.5–5.2)
Alkaline Phosphatase: 65 U/L (ref 39–117)
BUN: 20 mg/dL (ref 6–23)
CO2: 27 meq/L (ref 19–32)
Calcium: 9.6 mg/dL (ref 8.4–10.5)
Chloride: 103 meq/L (ref 96–112)
Creatinine, Ser: 0.87 mg/dL (ref 0.40–1.20)
GFR: 66.48 mL/min (ref >60.00)
Glucose, Bld: 120 mg/dL — ABNORMAL HIGH (ref 70–99)
Potassium: 4.2 meq/L (ref 3.5–5.1)
Sodium: 139 meq/L (ref 135–145)
Total Bilirubin: 1 mg/dL (ref 0.2–1.2)
Total Protein: 7 g/dL (ref 6.0–8.3)

## 2023-05-31 LAB — CBC WITH DIFFERENTIAL/PLATELET
Basophils Absolute: 0 10*3/uL (ref 0.0–0.1)
Basophils Relative: 0.7 % (ref 0.0–3.0)
Eosinophils Absolute: 0.2 10*3/uL (ref 0.0–0.7)
Eosinophils Relative: 2.6 % (ref 0.0–5.0)
HCT: 45.9 % (ref 36.0–46.0)
Hemoglobin: 14.8 g/dL (ref 12.0–15.0)
Lymphocytes Relative: 29.6 % (ref 12.0–46.0)
Lymphs Abs: 1.8 10*3/uL (ref 0.7–4.0)
MCHC: 32.3 g/dL (ref 30.0–36.0)
MCV: 94.7 fL (ref 78.0–100.0)
Monocytes Absolute: 0.4 10*3/uL (ref 0.1–1.0)
Monocytes Relative: 6.6 % (ref 3.0–12.0)
Neutro Abs: 3.7 10*3/uL (ref 1.4–7.7)
Neutrophils Relative %: 60.5 % (ref 43.0–77.0)
Platelets: 269 10*3/uL (ref 150.0–400.0)
RBC: 4.84 Mil/uL (ref 3.87–5.11)
RDW: 14.3 % (ref 11.5–15.5)
WBC: 6.1 10*3/uL (ref 4.0–10.5)

## 2023-05-31 LAB — HEMOGLOBIN A1C: Hgb A1c MFr Bld: 5.5 % (ref 4.6–6.5)

## 2023-05-31 LAB — VITAMIN D 25 HYDROXY (VIT D DEFICIENCY, FRACTURES): VITD: 47.3 ng/mL (ref 30.00–100.00)

## 2023-05-31 MED ORDER — GABAPENTIN 600 MG PO TABS: 600.0000 mg | ORAL_TABLET | Freq: Three times a day (TID) | ORAL | 1 refills | Status: DC

## 2023-05-31 MED ORDER — FUROSEMIDE 20 MG PO TABS: 20.0000 mg | ORAL_TABLET | Freq: Every morning | ORAL | 1 refills | Status: DC

## 2023-05-31 MED ORDER — LOSARTAN POTASSIUM 100 MG PO TABS: 100.0000 mg | ORAL_TABLET | Freq: Every morning | ORAL | 1 refills | Status: DC

## 2023-05-31 MED ORDER — BUPROPION HCL ER (XL) 300 MG PO TB24: 300.0000 mg | ORAL_TABLET | Freq: Every morning | ORAL | 1 refills | Status: DC

## 2023-05-31 MED ORDER — ALENDRONATE SODIUM 70 MG PO TABS: 70.0000 mg | ORAL_TABLET | ORAL | 3 refills | Status: DC

## 2023-05-31 MED ORDER — FLUOXETINE HCL 20 MG PO CAPS: 20.0000 mg | ORAL_CAPSULE | Freq: Every day | ORAL | 1 refills | Status: DC

## 2023-05-31 NOTE — Assessment & Plan Note (Signed)
Chronic.  Fair control.  Continue losartan 100 mg daily and Lasix 20 mg daily.  Monitor blood pressures at home.

## 2023-05-31 NOTE — Assessment & Plan Note (Signed)
Chronic.  Well-controlled.  Continue Wellbutrin 300 mg daily and Prozac 20 mg daily.  Some flaring now but realizes time of year that traumatic event occurred is Fall.  Doesn't want any med changes

## 2023-05-31 NOTE — Progress Notes (Signed)
Subjective:    Patient ID: Maria Wang, female    DOB: February 12, 1951, 72 y.o.   MRN: [id number]  Chief Complaint  Patient presents with   Medical Management of Chronic Issues    Pt inquiring about recent colonoscopy request. Pt also mentions clearance form for upcoming surgery.    HPI HTN - Managed with Losartan 100 mg and Furosemide 20 mg. BP's running 142/77 on initial check, 134/86 on recheck, and not checked at home. Denies ha/chest pains/palp/edema/cough/sob. Endorses dizziness when standing swiftly, since childhood. Reports she's been going to the gym regularly.   Depression - Managed with Prozac 20 mg daily and Bupropion 300 mg daily. Reports low motivation for the past month. She's doing what needs to be done, but is struggling with keeping house cleaning. Notes difficult event coinciding with Autumn. No SI.  In 3 different choirs  Surgical Clearance - Here to discuss surgery upcoming next year. She reports that she has been socially drinking more than usual, no more than 2-3 drinks.no cp/sob.  Can climb stairs w/o cardiac/resp symptoms and ride bike at gym.  Knee limits her.  Replacement after first of year  Bone DEXA - States her results indicate Osteoporosis. She is taking calcium, Vitamin-D, and is exercising regularly. Discussed possible preventative treatments. Denies severe dental issues.  Health Maintenance Due  Topic Date Due   DTaP/Tdap/Td  Never done   Hepatitis C Screening  Never done    Past Medical History:  Diagnosis Date   Allergy    Anxiety    Arthritis    Cancer (HCC)    right breast lumpectomy   Cataract    Complication of anesthesia    slow to wake up   Depression    GERD (gastroesophageal reflux disease)    Hepatitis    prb A   History of kidney stones    Hypertension    PONV (postoperative nausea and vomiting)    Sleep apnea    cpap   Thyroid disease     Past Surgical History:  Procedure Laterality Date   BREAST SURGERY Right     lumpectomy   chholecystectomy      CHOLECYSTECTOMY  1987   HERNIA REPAIR     umbilical   kn ee surgery  Left 1984   scope   LAPAROSCOPIC SMALL BOWEL RESECTION N/A 01/08/2022   Procedure: LAPAROSCOPIC SMALL BOWEL RESECTION;  Surgeon: Luretha Murphy, MD;  Location: [facility];  Service: General;  Laterality: N/A;   right breast lumpectomy   2015   SMALL INTESTINE SURGERY     divertic of small intestine   UMBILICAL HERNIA REPAIR       Current Outpatient Medications:    alendronate (FOSAMAX) 70 MG tablet, Take 1 tablet (70 mg total) by mouth every 7 (seven) days. Take with a full glass of water on an empty stomach., Disp: 12 tablet, Rfl: 3   Calcium Carb-Cholecalciferol (CALCIUM 600+D3 PO), Take 1 tablet by mouth in the morning and at bedtime., Disp: , Rfl:    cholecalciferol (VITAMIN D) 25 MCG (1000 UNIT) tablet, Take 1,000 Units by mouth in the morning., Disp: , Rfl:    Coenzyme Q10 300 MG CAPS, Take 300 mg by mouth in the morning., Disp: , Rfl:    Cyanocobalamin (VITAMIN B-12) 2500 MCG SUBL, Place 2,500 mcg under the tongue in the morning., Disp: , Rfl:    dicyclomine (BENTYL) 10 MG capsule, Take 10 mg by mouth 4 (four) times daily -  before meals  and at bedtime. As needed, Disp: , Rfl:    Glucosamine-Chondroitin (COSAMIN DS PO), Take 2 tablets by mouth in the morning., Disp: , Rfl:    Multiple Vitamin (MULTIVITAMIN WITH MINERALS) TABS tablet, Take 1 tablet by mouth in the morning. One A Day for Women 50+, Disp: , Rfl:    Multiple Vitamins-Minerals (MACULAR HEALTH FORMULA PO), Take 1 capsule by mouth in the morning and at bedtime. Morning & afternoon, Disp: , Rfl:    naproxen sodium (ALEVE) 220 MG tablet, Take 440 mg by mouth 2 (two) times daily as needed (pain.)., Disp: , Rfl:    Omega-3 Fatty Acids (FISH OIL) 1000 MG CAPS, Take 1,000 mg by mouth in the morning., Disp: , Rfl:    oxybutynin (DITROPAN-XL) 10 MG 24 hr tablet, Take 1 tablet (10 mg total) by mouth at bedtime., Disp: 90 tablet,  Rfl: 3   Polyethyl Glycol-Propyl Glycol (LUBRICANT EYE DROPS) 0.4-0.3 % SOLN, Place 1-2 drops into both eyes 3 (three) times daily as needed (dry/irritated eyes.)., Disp: , Rfl:    triamcinolone cream (KENALOG) 0.1 %, Apply 1 application. topically daily as needed (ear eczema)., Disp: , Rfl:    buPROPion (WELLBUTRIN XL) 300 MG 24 hr tablet, Take 1 tablet (300 mg total) by mouth every morning., Disp: 90 tablet, Rfl: 1   FLUoxetine (PROZAC) 20 MG capsule, Take 1 capsule (20 mg total) by mouth daily., Disp: 90 capsule, Rfl: 1   furosemide (LASIX) 20 MG tablet, Take 1 tablet (20 mg total) by mouth in the morning., Disp: 90 tablet, Rfl: 1   gabapentin (NEURONTIN) 600 MG tablet, Take 1 tablet (600 mg total) by mouth 3 (three) times daily., Disp: 270 tablet, Rfl: 1   losartan (COZAAR) 100 MG tablet, Take 1 tablet (100 mg total) by mouth in the morning., Disp: 90 tablet, Rfl: 1  Allergies  Allergen Reactions   Penicillin G Hives    Other Reaction(s): hives as child   Tetanus Immune Globulin     Other Reaction(s): severe swelling at site, upper arm   Tetanus Toxoids Other (See Comments)    Arm swelled up   ROS neg/noncontributory except as noted HPI/below +RUQ and diarrhea for 2 days about 1 wk ago.  No f/c.  No more pain  Objective:  BP 134/86 (BP Location: Left Arm, Patient Position: Sitting, Cuff Size: Large)   Pulse (!) 58   Wt 233 lb 14.4 oz (106.1 kg)   SpO2 97%   BMI 41.43 kg/m  Wt Readings from Last 3 Encounters:  05/31/23 233 lb 14.4 oz (106.1 kg)  01/06/23 226 lb (102.5 kg)  12/21/22 226 lb (102.5 kg)   Physical Exam   Gen: WDWN NAD HEENT: NCAT, conjunctiva not injected, sclera nonicteric NECK:  supple, no thyromegaly, no nodes, no carotid bruits CARDIAC: RRR, S1S2+, no murmur. DP 2+B LUNGS: CTAB. No wheezes ABDOMEN:  BS+, soft, NTND, No HSM, no masses +tenderness LUQ  EXT:  no edema MSK: no gross abnormalities.  NEURO: A&O x3.  CN II-XII intact.  PSYCH: normal mood. Good  eye contact  Discussed DXA Assessment & Plan:  Essential hypertension Assessment & Plan: Chronic.  Fair control.  Continue losartan 100 mg daily and Lasix 20 mg daily.  Monitor blood pressures at home.    Orders: -     Furosemide; Take 1 tablet (20 mg total) by mouth in the morning.  Dispense: 90 tablet; Refill: 1 -     Losartan Potassium; Take 1 tablet (100 mg total)  by mouth in the morning.  Dispense: 90 tablet; Refill: 1 -     CBC with Differential/Platelet -     Comprehensive metabolic panel  Major depressive disorder, recurrent, in partial remission (HCC) Assessment & Plan: Chronic.  Well-controlled.  Continue Wellbutrin 300 mg daily and Prozac 20 mg daily.  Some flaring now but realizes time of year that traumatic event occurred is Fall.  Doesn't want any med changes  Orders: -     buPROPion HCl ER (XL); Take 1 tablet (300 mg total) by mouth every morning.  Dispense: 90 tablet; Refill: 1 -     FLUoxetine HCl; Take 1 capsule (20 mg total) by mouth daily.  Dispense: 90 capsule; Refill: 1  Hyperglycemia -     Hemoglobin A1c  Age-related osteoporosis without current pathological fracture Assessment & Plan: Chronic.  New dx.  Pt active and on calcium and D.  Will add fossamax 70mg  weekly SED and how to take it  Orders: -     Alendronate Sodium; Take 1 tablet (70 mg total) by mouth every 7 (seven) days. Take with a full glass of water on an empty stomach.  Dispense: 12 tablet; Refill: 3 -     VITAMIN D 25 Hydroxy (Vit-D Deficiency, Fractures)  Screening for viral disease -     Hepatitis C antibody  Other orders -     Gabapentin; Take 1 tablet (600 mg total) by mouth 3 (three) times daily.  Dispense: 270 tablet; Refill: 1  Surgical clearance.  Ok for surgery for L knee replacement  Return in about 6 months (around 11/29/2023) for HTN.   I,Emily Lagle,acting as a Neurosurgeon for Angelena Sole, MD.,have documented all relevant documentation on the behalf of Angelena Sole, MD,as  directed by  Angelena Sole, MD while in the presence of Angelena Sole, MD.  I, Angelena Sole, MD, have reviewed all documentation for this visit. The documentation on 05/31/23 for the exam, diagnosis, procedures, and orders are all accurate and complete.  (refresh reminder)  Angelena Sole, MD

## 2023-05-31 NOTE — Patient Instructions (Signed)
It was very nice to see you today!  Start alendronate for osteoporosis   PLEASE NOTE:  If you had any lab tests please let us know if you have not heard back within a few days. You may see your results on MyChart before we have a chance to review them but we will give you a call once they are reviewed by [place]. If we ordered any referrals today, please let us know if you have not heard from their office within the next week.   Please try these tips to maintain a healthy lifestyle:  Eat most of your calories during the day when you are active. Eliminate processed foods including packaged sweets (pies, cakes, cookies), reduce intake of potatoes, white bread, white pasta, and white rice. Look for whole grain options, oat flour or almond flour.  Each meal should contain half fruits/vegetables, one quarter protein, and one quarter carbs (no bigger than a computer mouse).  Cut down on sweet beverages. This includes juice, soda, and sweet tea. Also watch fruit intake, though this is a healthier sweet option, it still contains natural sugar! Limit to 3 servings daily.  Drink at least 1 glass of water with each meal and aim for at least 8 glasses per day  Exercise at least 150 minutes every week.

## 2023-05-31 NOTE — Assessment & Plan Note (Signed)
Chronic.  New dx.  Pt active and on calcium and D.  Will add fossamax 70mg  weekly SED and how to take it

## 2023-06-01 LAB — HEPATITIS C ANTIBODY: Hepatitis C Ab: NONREACTIVE

## 2023-06-03 NOTE — Progress Notes (Signed)
Labs look great.  I presume not fasting so sugar slightly up.  Of course, work on diet/exercise

## 2023-06-07 ENCOUNTER — Other Ambulatory Visit: Payer: Self-pay | Admitting: Family Medicine

## 2023-07-06 DIAGNOSIS — H40013 Open angle with borderline findings, low risk, bilateral: Secondary | ICD-10-CM | POA: Diagnosis not present

## 2023-08-31 ENCOUNTER — Emergency Department (HOSPITAL_BASED_OUTPATIENT_CLINIC_OR_DEPARTMENT_OTHER): Payer: PPO

## 2023-08-31 ENCOUNTER — Encounter (HOSPITAL_BASED_OUTPATIENT_CLINIC_OR_DEPARTMENT_OTHER): Payer: Self-pay | Admitting: Emergency Medicine

## 2023-08-31 ENCOUNTER — Emergency Department (HOSPITAL_BASED_OUTPATIENT_CLINIC_OR_DEPARTMENT_OTHER)
Admission: EM | Admit: 2023-08-31 | Discharge: 2023-08-31 | Disposition: A | Discharge status: Home / Self Care | Payer: PPO | Attending: Emergency Medicine | Admitting: Emergency Medicine

## 2023-08-31 ENCOUNTER — Other Ambulatory Visit: Payer: Self-pay

## 2023-08-31 DIAGNOSIS — R55 Syncope and collapse: Secondary | ICD-10-CM | POA: Insufficient documentation

## 2023-08-31 DIAGNOSIS — I10 Essential (primary) hypertension: Secondary | ICD-10-CM | POA: Insufficient documentation

## 2023-08-31 DIAGNOSIS — E860 Dehydration: Secondary | ICD-10-CM | POA: Insufficient documentation

## 2023-08-31 LAB — CBC WITH DIFFERENTIAL/PLATELET
Abs Immature Granulocytes: 0.04 10*3/uL (ref 0.00–0.07)
Basophils Absolute: 0 10*3/uL (ref 0.0–0.1)
Basophils Relative: 1 %
Eosinophils Absolute: 0.1 10*3/uL (ref 0.0–0.5)
Eosinophils Relative: 1 %
HCT: 42.9 % (ref 36.0–46.0)
Hemoglobin: 14.7 g/dL (ref 12.0–15.0)
Immature Granulocytes: 1 %
Lymphocytes Relative: 12 %
Lymphs Abs: 0.9 10*3/uL (ref 0.7–4.0)
MCH: 30.9 pg (ref 26.0–34.0)
MCHC: 34.3 g/dL (ref 30.0–36.0)
MCV: 90.3 fL (ref 80.0–100.0)
Monocytes Absolute: 0.4 10*3/uL (ref 0.1–1.0)
Monocytes Relative: 5 %
Neutro Abs: 5.9 10*3/uL (ref 1.7–7.7)
Neutrophils Relative %: 80 %
Platelets: 210 10*3/uL (ref 150–400)
RBC: 4.75 MIL/uL (ref 3.87–5.11)
RDW: 13.2 % (ref 11.5–15.5)
WBC: 7.3 10*3/uL (ref 4.0–10.5)
nRBC: 0 % (ref 0.0–0.2)

## 2023-08-31 LAB — BASIC METABOLIC PANEL
Anion gap: 16 — ABNORMAL HIGH (ref 5–15)
BUN: 14 mg/dL (ref 8–23)
CO2: 22 mmol/L (ref 22–32)
Calcium: 9.5 mg/dL (ref 8.9–10.3)
Chloride: 101 mmol/L (ref 98–111)
Creatinine, Ser: 0.84 mg/dL (ref 0.44–1.00)
GFR, Estimated: >60 mL/min (ref >60)
Glucose, Bld: 122 mg/dL — ABNORMAL HIGH (ref 70–99)
Potassium: 3.5 mmol/L (ref 3.5–5.1)
Sodium: 139 mmol/L (ref 135–145)

## 2023-08-31 LAB — MAGNESIUM: Magnesium: 1.5 mg/dL — ABNORMAL LOW (ref 1.7–2.4)

## 2023-08-31 MED ORDER — MAGNESIUM SULFATE 2 GM/50ML IV SOLN
2.0000 g | Freq: Once | INTRAVENOUS | Status: AC
  Administered 2023-08-31: 2 g via INTRAVENOUS
  Filled 2023-08-31: qty 50

## 2023-08-31 MED ORDER — POTASSIUM CHLORIDE CRYS ER 20 MEQ PO TBCR
40.0000 meq | EXTENDED_RELEASE_TABLET | ORAL | Status: AC
  Administered 2023-08-31: 40 meq via ORAL
  Filled 2023-08-31: qty 2

## 2023-08-31 MED ORDER — LACTATED RINGERS IV BOLUS
1000.0000 mL | Freq: Once | INTRAVENOUS | Status: AC
  Administered 2023-08-31: 1000 mL via INTRAVENOUS

## 2023-08-31 NOTE — Discharge Instructions (Signed)
You were seen for passing out in the emergency department.  This is likely from dehydration in the setting of vigorous exercise.  In the emergency department.   At home, please stay well-hydrated.    Check your MyChart online for the results of any tests that had not resulted by the time you left the emergency department.   Follow-up with your primary doctor in 2-3 days regarding your visit.    Return immediately to the emergency department if you experience any of the following: Recurrent fainting, or any other concerning symptoms.    Thank you for visiting our Emergency Department. It was a pleasure taking care of you today.

## 2023-08-31 NOTE — ED Notes (Signed)
Pt. Was at [employer] on excersice bike, found by staff slumped over. Pt. Was lowered to ground, did not hit head. EMS states pt. Dizzy upon sitting and dizzy upon standing. Unknown down time.  Otilio Carpen, RN)

## 2023-08-31 NOTE — ED Notes (Signed)
Pt. Up to BR with standby assist. Pt. Denies dizziness at this time and gait steady.

## 2023-08-31 NOTE — ED Triage Notes (Signed)
Pt. Arrived via [facility] from [employer]. Pt. Was found slumped over exercise bike, lowered to ground with unknown down time. Pt. Did not hit head. Per EMS pt. Was dizzy upon sitting and standing. EMS vitals 142/86, 110HR, 94%RA. Given 200cc Normal saline bolus and BP was 110/78.

## 2023-08-31 NOTE — ED Provider Notes (Signed)
[place] EMERGENCY DEPARTMENT AT [facility] Provider Note   CSN: [id number] Arrival date & time: 08/31/23  1522     History  Chief Complaint  Patient presents with   Loss of Consciousness    Maria Wang is a 73 y.o. female.  73 year old female with a history of vagus neuropathy, hypertension, and bilateral osteoarthritis who presents to the emergency department with syncopal event.  Patient was at [employer] and she was using a stationary bicycle for approximately 40 minutes.  Reports that it was a very vigorous workout.  Has not been staying well-hydrated today.  Towards the end of the workout felt very dizzy and says that she had to slump over the bike but according to bystanders she fully lost consciousness.  No head strike or fall from the bicycle.  No chest pain or shortness of breath.  Still feels mildly lightheaded headed at this time.  EMS gave 200 mL of IV fluids.  No history of syncope.       Home Medications Prior to Admission medications   Medication Sig Start Date End Date Taking? Authorizing Provider  alendronate (FOSAMAX) 70 MG tablet Take 1 tablet (70 mg total) by mouth every 7 (seven) days. Take with a full glass of water on an empty stomach. 05/31/23  Yes Jeani Sow, MD  buPROPion (WELLBUTRIN XL) 300 MG 24 hr tablet Take 1 tablet (300 mg total) by mouth every morning. 05/31/23  Yes Jeani Sow, MD  Calcium Carb-Cholecalciferol (CALCIUM 600+D3 PO) Take 1 tablet by mouth in the morning and at bedtime.   Yes [provider]  cholecalciferol (VITAMIN D) 25 MCG (1000 UNIT) tablet Take 1,000 Units by mouth in the morning.   Yes [provider]  Coenzyme Q10 300 MG CAPS Take 300 mg by mouth in the morning.   Yes [provider]  Cyanocobalamin (VITAMIN B-12) 2500 MCG SUBL Place 2,500 mcg under the tongue in the morning.   Yes [provider]  dicyclomine (BENTYL) 10 MG capsule Take 10 mg by mouth 4 (four)  times daily -  before meals and at bedtime. As needed   Yes [provider]  FLUoxetine (PROZAC) 20 MG capsule Take 1 capsule (20 mg total) by mouth daily. 05/31/23  Yes Jeani Sow, MD  furosemide (LASIX) 20 MG tablet Take 1 tablet (20 mg total) by mouth in the morning. 05/31/23  Yes Jeani Sow, MD  gabapentin (NEURONTIN) 600 MG tablet Take 1 tablet (600 mg total) by mouth 3 (three) times daily. 05/31/23  Yes Jeani Sow, MD  Glucosamine-Chondroitin (COSAMIN DS PO) Take 2 tablets by mouth in the morning.   Yes [provider]  losartan (COZAAR) 100 MG tablet Take 1 tablet (100 mg total) by mouth in the morning. 05/31/23  Yes Jeani Sow, MD  Multiple Vitamin (MULTIVITAMIN WITH MINERALS) TABS tablet Take 1 tablet by mouth in the morning. One A Day for Women 50+   Yes [provider]  Multiple Vitamins-Minerals (MACULAR HEALTH FORMULA PO) Take 1 capsule by mouth in the morning and at bedtime. Morning & afternoon   Yes [provider]  Omega-3 Fatty Acids (FISH OIL) 1000 MG CAPS Take 1,000 mg by mouth in the morning.   Yes [provider]  oxybutynin (DITROPAN-XL) 10 MG 24 hr tablet TAKE 1 TABLET BY MOUTH AT BEDTIME 06/07/23  Yes Jeani Sow, MD  Polyethyl Glycol-Propyl Glycol (LUBRICANT EYE DROPS) 0.4-0.3 % SOLN Place 1-2  drops into both eyes 3 (three) times daily as needed (dry/irritated eyes.).   Yes [provider]  naproxen sodium (ALEVE) 220 MG tablet Take 440 mg by mouth 2 (two) times daily as needed (pain.).    [provider]  triamcinolone cream (KENALOG) 0.1 % Apply 1 application. topically daily as needed (ear eczema).    [provider]      Allergies    Penicillin g, Tetanus immune globulin, and Tetanus toxoids    Review of Systems   Review of Systems  Physical Exam Updated Vital Signs BP (!) 153/80   Pulse (!) 52   Temp (!) 97.5 F (36.4 C) (Oral)   Resp 10   Ht 5\' 3"  (1.6 m)    Wt 101.5 kg   SpO2 94%   BMI 39.63 kg/m  Physical Exam Vitals and nursing note reviewed.  Constitutional:      General: She is not in acute distress.    Appearance: She is well-developed.  HENT:     Head: Normocephalic and atraumatic.     Right Ear: External ear normal.     Left Ear: External ear normal.     Nose: Nose normal.  Eyes:     Extraocular Movements: Extraocular movements intact.     Conjunctiva/sclera: Conjunctivae normal.     Pupils: Pupils are equal, round, and reactive to light.  Cardiovascular:     Rate and Rhythm: Normal rate and regular rhythm.     Heart sounds: No murmur heard. Pulmonary:     Effort: Pulmonary effort is normal. No respiratory distress.     Breath sounds: Normal breath sounds.  Musculoskeletal:     Cervical back: Normal range of motion and neck supple.     Right lower leg: No edema.     Left lower leg: No edema.  Skin:    General: Skin is warm and dry.  Neurological:     Mental Status: She is alert and oriented to person, place, and time. Mental status is at baseline.     Cranial Nerves: No cranial nerve deficit.     Sensory: No sensory deficit.     Motor: No weakness.  Psychiatric:        Mood and Affect: Mood normal.     ED Results / Procedures / Treatments   Labs (all labs ordered are listed, but only abnormal results are displayed) Labs Reviewed  BASIC METABOLIC PANEL - Abnormal; Notable for the following components:      Result Value   Glucose, Bld 122 (*)    Anion gap 16 (*)    All other components within normal limits  MAGNESIUM - Abnormal; Notable for the following components:   Magnesium 1.5 (*)    All other components within normal limits  CBC WITH DIFFERENTIAL/PLATELET    EKG EKG Interpretation Date/Time:  Wednesday August 31 2023 16:04:05 EST Ventricular Rate:  63 PR Interval:  152 QRS Duration:  98 QT Interval:  455 QTC Calculation: 466 R Axis:   6  Text Interpretation: Sinus rhythm Low voltage,  precordial leads Confirmed by Vonita Moss [phone]) on 08/31/2023 4:30:47 PM  Radiology DG Chest Portable 1 View Result Date: 08/31/2023 CLINICAL DATA:  Syncope EXAM: PORTABLE CHEST 1 VIEW COMPARISON:  X-ray 04/10/2007 FINDINGS: No consolidation, pneumothorax or effusion. No edema. Normal cardiopericardial silhouette. Overlapping cardiac leads. Degenerative changes of the spine. Surgical clips in the right upper quadrant of the abdomen. IMPRESSION: No acute cardiopulmonary disease. Electronically Signed   By: Scarlette Shorts  Chales Abrahams M.D.   On: 08/31/2023 17:06    Procedures Procedures    Medications Ordered in ED Medications  magnesium sulfate IVPB 2 g 50 mL (2 g Intravenous New Bag/Given 08/31/23 1730)  lactated ringers bolus 1,000 mL (1,000 mLs Intravenous New Bag/Given 08/31/23 1606)  potassium chloride SA (KLOR-CON M) CR tablet 40 mEq (40 mEq Oral Given 08/31/23 1728)    ED Course/ Medical Decision Making/ A&P                                 Medical Decision Making Amount and/or Complexity of Data Reviewed Labs: ordered. Radiology: ordered.  Risk Prescription drug management.   Tammey Deeg is a 73 y.o. female with comorbidities that complicate the patient evaluation including vagus neuropathy, hypertension, and bilateral osteoarthritis who presents to the emergency department with syncopal event.    Initial Ddx:  Dehydration, heat related syncope, orthostasis, arrhythmia, seizure  MDM/Course:  Patient notes emergency department after syncopal episode.  She had been dehydrated prior to starting exercise and says that she had been cycling for 40 minutes prior to passing out.  On exam is in no acute distress.  Suspect that she got dehydrated and/or had heat related syncope at the gym.  Given IV fluids and upon re-evaluation was feeling back to baseline.  EKG without any signs of arrhythmia such as Brugada, long QT, or WPW.  No witnessed seizures by bystanders.  Patient counseled to  stay well-hydrated prior to exercising.  Will have her follow-up with her primary doctor in several days as well.  Low magnesium was replenished  This patient presents to the ED for concern of complaints listed in HPI, this involves an extensive number of treatment options, and is a complaint that carries with it a high risk of complications and morbidity. Disposition including potential need for admission considered.   Dispo: DC Home. Return precautions discussed including, but not limited to, those listed in the AVS. Allowed pt time to ask questions which were answered fully prior to dc.  Records reviewed Outpatient Clinic Notes The following labs were independently interpreted: Chemistry and show no acute abnormality I independently reviewed the following imaging with scope of interpretation limited to determining acute life threatening conditions related to emergency care: Chest x-ray and agree with the radiologist interpretation with the following exceptions: none I personally reviewed and interpreted cardiac monitoring: normal sinus rhythm  I personally reviewed and interpreted the pt's EKG: see above for interpretation  I have reviewed the patients home medications and made adjustments as needed Social Determinants of health:  Elderly  Portions of this note were generated with Scientist, clinical (histocompatibility and immunogenetics). Dictation errors may occur despite best attempts at proofreading.     Final Clinical Impression(s) / ED Diagnoses Final diagnoses:  Syncope, unspecified syncope type  Dehydration  Hypomagnesemia    Rx / DC Orders ED Discharge Orders     None         Rondel Baton, MD 08/31/23 [phone]

## 2023-09-05 ENCOUNTER — Encounter: Payer: Self-pay | Admitting: Family Medicine

## 2023-09-05 ENCOUNTER — Ambulatory Visit (INDEPENDENT_AMBULATORY_CARE_PROVIDER_SITE_OTHER): Payer: PPO | Admitting: Family Medicine

## 2023-09-05 VITALS — BP 110/82 | HR 73 | Temp 97.0°F | Resp 18 | Ht 63.0 in | Wt 223.4 lb

## 2023-09-05 DIAGNOSIS — I10 Essential (primary) hypertension: Secondary | ICD-10-CM

## 2023-09-05 DIAGNOSIS — R55 Syncope and collapse: Secondary | ICD-10-CM | POA: Diagnosis not present

## 2023-09-05 LAB — COMPREHENSIVE METABOLIC PANEL
ALT: 31 U/L (ref 0–35)
AST: 23 U/L (ref 0–37)
Albumin: 4.1 g/dL (ref 3.5–5.2)
Alkaline Phosphatase: 54 U/L (ref 39–117)
BUN: 15 mg/dL (ref 6–23)
CO2: 27 meq/L (ref 19–32)
Calcium: 9.3 mg/dL (ref 8.4–10.5)
Chloride: 102 meq/L (ref 96–112)
Creatinine, Ser: 0.86 mg/dL (ref 0.40–1.20)
GFR: 67.28 mL/min (ref >60.00)
Glucose, Bld: 111 mg/dL — ABNORMAL HIGH (ref 70–99)
Potassium: 4.8 meq/L (ref 3.5–5.1)
Sodium: 139 meq/L (ref 135–145)
Total Bilirubin: 0.9 mg/dL (ref 0.2–1.2)
Total Protein: 6.9 g/dL (ref 6.0–8.3)

## 2023-09-05 LAB — MAGNESIUM: Magnesium: 1.8 mg/dL (ref 1.5–2.5)

## 2023-09-05 NOTE — Progress Notes (Signed)
Subjective:     Patient ID: Maria Wang, female    DOB: 1950-11-21, 73 y.o.   MRN: [id number]  Chief Complaint  Patient presents with   Follow-up    Hospital follow-up for syncope on 08/31/23 Still light headed    HPI Discussed the use of AI scribe software for clinical note transcription with the patient, who gave verbal consent to proceed.  History of Present Illness   The patient is a 73 year old female with vagus neuropathy and hypertension who presents with a syncopal event.  On January 29th, she experienced a syncopal event while exercising at [employer]. During a vigorous 40-minute workout on a stationary bike, she felt very dizzy, slumped over the bike, and lost consciousness. She had not consumed much water that day. Bystanders noted she did not hit her head or fall off the bike and did not experience chest pains, palpitations, or shortness of breath. EMS was called, and she received 200 mL of fluids before arriving at the emergency room.  In the emergency room, her EKG was unremarkable, chest x-ray was normal, and BMP showed a magnesium level of 1.5. CBC was normal. She received 2 grams of magnesium sulfate IV and 1000 mL of lactated Ringer's, as well as 40 mg of potassium orally. She returned to baseline and was stable for discharge.  She reports that her heart rate during the workout reached 145, higher than her usual peak of 130, and did not recover as quickly as usual after slowing down. Upon standing, she felt dizzy and had to sit down. She continues to feel lightheaded when getting up and moving around, with her heart rate rising quickly to the 90's. She has not exercised since the incident.  She has a history of positional hypotension and reports a low-grade headache for a couple of weeks. No dizziness while sitting, but dizziness occurs when standing. No shortness of breath when walking. No swelling in her legs and she does not wear compression stockings.  She  is currently taking Lasix (furosemide) and losartan for blood pressure management. She has not been checking her blood pressure at home. Her resting heart rate is typically in the 50s according to her Fitbit. She does not take potassium supplements and has not experienced diarrhea or vomiting.   on Lasix for bp-not edema       Health Maintenance Due  Topic Date Due   DTaP/Tdap/Td  Never done    Past Medical History:  Diagnosis Date   Allergy    Anxiety    Arthritis    Cancer (HCC)    right breast lumpectomy   Cataract    Complication of anesthesia    slow to wake up   Depression    GERD (gastroesophageal reflux disease)    Hepatitis    prb A   History of kidney stones    Hypertension    PONV (postoperative nausea and vomiting)    Sleep apnea    cpap   Thyroid disease     Past Surgical History:  Procedure Laterality Date   BREAST SURGERY Right    lumpectomy   chholecystectomy      CHOLECYSTECTOMY  1987   HERNIA REPAIR     umbilical   kn ee surgery  Left 1984   scope   LAPAROSCOPIC SMALL BOWEL RESECTION N/A 01/08/2022   Procedure: LAPAROSCOPIC SMALL BOWEL RESECTION;  Surgeon: Luretha Murphy, MD;  Location: [facility];  Service: General;  Laterality: N/A;  right breast lumpectomy   2015   SMALL INTESTINE SURGERY     divertic of small intestine   UMBILICAL HERNIA REPAIR       Current Outpatient Medications:    alendronate (FOSAMAX) 70 MG tablet, Take 1 tablet (70 mg total) by mouth every 7 (seven) days. Take with a full glass of water on an empty stomach., Disp: 12 tablet, Rfl: 3   buPROPion (WELLBUTRIN XL) 300 MG 24 hr tablet, Take 1 tablet (300 mg total) by mouth every morning., Disp: 90 tablet, Rfl: 1   Calcium Carb-Cholecalciferol (CALCIUM 600+D3 PO), Take 1 tablet by mouth in the morning and at bedtime., Disp: , Rfl:    cholecalciferol (VITAMIN D) 25 MCG (1000 UNIT) tablet, Take 1,000 Units by mouth in the morning., Disp: , Rfl:    Coenzyme Q10 300 MG CAPS,  Take 300 mg by mouth in the morning., Disp: , Rfl:    Cyanocobalamin (VITAMIN B-12) 2500 MCG SUBL, Place 2,500 mcg under the tongue in the morning., Disp: , Rfl:    dicyclomine (BENTYL) 10 MG capsule, Take 10 mg by mouth 4 (four) times daily -  before meals and at bedtime. As needed, Disp: , Rfl:    FLUoxetine (PROZAC) 20 MG capsule, Take 1 capsule (20 mg total) by mouth daily., Disp: 90 capsule, Rfl: 1   furosemide (LASIX) 20 MG tablet, Take 1 tablet (20 mg total) by mouth in the morning., Disp: 90 tablet, Rfl: 1   gabapentin (NEURONTIN) 600 MG tablet, Take 1 tablet (600 mg total) by mouth 3 (three) times daily., Disp: 270 tablet, Rfl: 1   Glucosamine-Chondroitin (COSAMIN DS PO), Take 2 tablets by mouth in the morning., Disp: , Rfl:    losartan (COZAAR) 100 MG tablet, Take 1 tablet (100 mg total) by mouth in the morning., Disp: 90 tablet, Rfl: 1   Multiple Vitamin (MULTIVITAMIN WITH MINERALS) TABS tablet, Take 1 tablet by mouth in the morning. One A Day for Women 50+, Disp: , Rfl:    Multiple Vitamins-Minerals (MACULAR HEALTH FORMULA PO), Take 1 capsule by mouth in the morning and at bedtime. Morning & afternoon, Disp: , Rfl:    naproxen sodium (ALEVE) 220 MG tablet, Take 440 mg by mouth 2 (two) times daily as needed (pain.)., Disp: , Rfl:    Omega-3 Fatty Acids (FISH OIL) 1000 MG CAPS, Take 1,000 mg by mouth in the morning., Disp: , Rfl:    oxybutynin (DITROPAN-XL) 10 MG 24 hr tablet, TAKE 1 TABLET BY MOUTH AT BEDTIME, Disp: 90 tablet, Rfl: 3   Polyethyl Glycol-Propyl Glycol (LUBRICANT EYE DROPS) 0.4-0.3 % SOLN, Place 1-2 drops into both eyes 3 (three) times daily as needed (dry/irritated eyes.)., Disp: , Rfl:    triamcinolone cream (KENALOG) 0.1 %, Apply 1 application. topically daily as needed (ear eczema)., Disp: , Rfl:   Allergies  Allergen Reactions   Penicillin G Hives    Other Reaction(s): hives as child   Tetanus Immune Globulin     Other Reaction(s): severe swelling at site, upper arm    Tetanus Toxoids Other (See Comments)    Arm swelled up   ROS neg/noncontributory except as noted HPI/below      Objective:     BP 110/82 (BP Location: Left Arm, Patient Position: Standing, Cuff Size: Large)   Pulse 73   Temp (!) 97 F (36.1 C) (Temporal)   Resp 18   Ht 5\' 3"  (1.6 m)   Wt 223 lb 6 oz (101.3 kg)  SpO2 97%   BMI 39.57 kg/m  Wt Readings from Last 3 Encounters:  09/05/23 223 lb 6 oz (101.3 kg)  08/31/23 223 lb 11.2 oz (101.5 kg)  05/31/23 233 lb 14.4 oz (106.1 kg)    Physical Exam   Gen: WDWN NAD HEENT: NCAT, conjunctiva not injected, sclera nonicteric NECK:  supple, no thyromegaly, no nodes, no carotid bruits CARDIAC: RRR, S1S2+, no murmur. DP 2+B LUNGS: CTAB. No wheezes ABDOMEN:  BS+, soft, NTND, No HSM, no masses EXT:  no edema MSK: no gross abnormalities.  NEURO: A&O x3.  CN II-XII intact. Some dizziness upon standing.  There was a drop in bp after 2 minutes as well.  PSYCH: normal mood. Good eye contact Reviewed ER records    Assessment & Plan:  Vasovagal syncope -     Comprehensive metabolic panel -     Magnesium  Essential hypertension -     Comprehensive metabolic panel -     Magnesium  Assessment and Plan    Syncope A syncopal event occurred on January 29th during vigorous exercise, accompanied by dizziness and loss of consciousness, but no head injury, chest pain, palpitations, or dyspnea. An ER workup showed an unremarkable EKG, normal chest x-ray, and low magnesium (1.5). Treatment included 2 grams magnesium sulfate IV, 1000 mL lactated Ringer's, and 40 mg potassium orally. Persistent lightheadedness and tachycardia upon standing were noted. Possible contributing factors include dehydration, vigorous exercise, and medication effects. To address potential dehydration and positional hypotension, furosemide will be stopped. Blood pressure will be checked daily at different times, with readings and symptoms logged. Compression stockings are  recommended. Follow-up is scheduled in two weeks or sooner if needed, and blood work will be ordered.  Hypertension Currently managed with losartan and furosemide, with a blood pressure of 133/70. Concerns about dehydration and positional hypotension due to furosemide have been discussed. Blood pressure will be monitored at home, and medications adjusted as needed. Potassium supplementation may be necessary if levels are borderline low. Furosemide will be stopped, and blood pressure will be checked daily at different times, with readings and symptoms logged. Follow-up is scheduled in two weeks or sooner if needed.  Vagus Neuropathy This condition may contribute to syncopal episodes and positional hypotension. No new specific treatment changes are planned. Symptoms and blood pressure will be monitored, with follow-up in two weeks or sooner if needed.  General Health Maintenance Hydration is being maintained. No new specific health maintenance issues are present. Adequate hydration is encouraged,.  No exercise until more data and f/u.  Follow-up A follow-up is scheduled in two weeks or sooner if needed, and blood work will be ordered.        Return in about 2 weeks (around 09/19/2023) for HTN.  Angelena Sole, MD

## 2023-09-05 NOTE — Patient Instructions (Signed)
Stop the furosemide.   Check bp once/day-different times.  And note if symptoms.   Let me know bp's  Compression stockings

## 2023-09-05 NOTE — Progress Notes (Signed)
Labs much better

## 2023-09-19 ENCOUNTER — Ambulatory Visit (INDEPENDENT_AMBULATORY_CARE_PROVIDER_SITE_OTHER): Payer: PPO | Admitting: Family Medicine

## 2023-09-19 ENCOUNTER — Encounter: Payer: Self-pay | Admitting: Family Medicine

## 2023-09-19 VITALS — BP 150/86 | HR 62 | Temp 97.5°F | Resp 18 | Ht 63.0 in | Wt 221.1 lb

## 2023-09-19 DIAGNOSIS — I10 Essential (primary) hypertension: Secondary | ICD-10-CM | POA: Diagnosis not present

## 2023-09-19 MED ORDER — AMLODIPINE BESYLATE 5 MG PO TABS: 5.0000 mg | ORAL_TABLET | Freq: Every day | ORAL | 0 refills | Status: DC

## 2023-09-19 NOTE — Patient Instructions (Signed)
 It was very nice to see you today!  Add amlodipine    PLEASE NOTE:  If you had any lab tests please let us know if you have not heard back within a few days. You may see your results on MyChart before we have a chance to review them but we will give you a call once they are reviewed by [place]. If we ordered any referrals today, please let us know if you have not heard from their office within the next week.   Please try these tips to maintain a healthy lifestyle:  Eat most of your calories during the day when you are active. Eliminate processed foods including packaged sweets (pies, cakes, cookies), reduce intake of potatoes, white bread, white pasta, and white rice. Look for whole grain options, oat flour or almond flour.  Each meal should contain half fruits/vegetables, one quarter protein, and one quarter carbs (no bigger than a computer mouse).  Cut down on sweet beverages. This includes juice, soda, and sweet tea. Also watch fruit intake, though this is a healthier sweet option, it still contains natural sugar! Limit to 3 servings daily.  Drink at least 1 glass of water with each meal and aim for at least 8 glasses per day  Exercise at least 150 minutes every week.

## 2023-09-19 NOTE — Progress Notes (Signed)
 Subjective:     Patient ID: Maria Wang, female    DOB: 25-Dec-1950, 73 y.o.   MRN: [id number]  Chief Complaint  Patient presents with   Hypertension    2 week follow-up on htn    HPI  HTN-  Discussed the use of AI scribe software for clinical note transcription with the patient, who gave verbal consent to proceed.  History of Present Illness   Maria Wang is a 73 year old female with hypertension who presents with elevated blood pressure readings.  She has been experiencing elevated blood pressure readings since discontinuing furosemide. Her systolic blood pressure consistently ranges from 170 to 180 mmHg, while her diastolic pressure is in the 80s, occasionally reaching 90 mmHg. Previously, her blood pressure was better controlled with furosemide, though not optimally low.  She is currently taking losartan 100 mg but stopped furosemide due to concerns about dizziness and potassium depletion. While on furosemide, she experienced dizziness and balance issues, but she is uncertain if these symptoms have improved since discontinuing the medication. She does not pay much attention to these symptoms as they are common for her.  No chest pain is present, but she has low-level headaches most of the time, which have not changed since stopping furosemide. She is not experiencing any new symptoms such as swelling in the legs.       There are no preventive care reminders to display for this patient.  Past Medical History:  Diagnosis Date   Allergy    Anxiety    Arthritis    Cancer (HCC)    right breast lumpectomy   Cataract    Complication of anesthesia    slow to wake up   Depression    GERD (gastroesophageal reflux disease)    Hepatitis    prb A   History of kidney stones    Hypertension    PONV (postoperative nausea and vomiting)    Sleep apnea    cpap   Thyroid disease     Past Surgical History:  Procedure Laterality Date   BREAST SURGERY Right     lumpectomy   chholecystectomy      CHOLECYSTECTOMY  1987   HERNIA REPAIR     umbilical   kn ee surgery  Left 1984   scope   LAPAROSCOPIC SMALL BOWEL RESECTION N/A 01/08/2022   Procedure: LAPAROSCOPIC SMALL BOWEL RESECTION;  Surgeon: Luretha Murphy, MD;  Location: [facility];  Service: General;  Laterality: N/A;   right breast lumpectomy   2015   SMALL INTESTINE SURGERY     divertic of small intestine   UMBILICAL HERNIA REPAIR       Current Outpatient Medications:    alendronate (FOSAMAX) 70 MG tablet, Take 1 tablet (70 mg total) by mouth every 7 (seven) days. Take with a full glass of water on an empty stomach., Disp: 12 tablet, Rfl: 3   amLODipine (NORVASC) 5 MG tablet, Take 1 tablet (5 mg total) by mouth daily., Disp: 30 tablet, Rfl: 0   buPROPion (WELLBUTRIN XL) 300 MG 24 hr tablet, Take 1 tablet (300 mg total) by mouth every morning., Disp: 90 tablet, Rfl: 1   Calcium Carb-Cholecalciferol (CALCIUM 600+D3 PO), Take 1 tablet by mouth in the morning and at bedtime., Disp: , Rfl:    cholecalciferol (VITAMIN D) 25 MCG (1000 UNIT) tablet, Take 1,000 Units by mouth in the morning., Disp: , Rfl:    Coenzyme Q10 300 MG CAPS, Take 300 mg by mouth in  the morning., Disp: , Rfl:    Cyanocobalamin (VITAMIN B-12) 2500 MCG SUBL, Place 2,500 mcg under the tongue in the morning., Disp: , Rfl:    dicyclomine (BENTYL) 10 MG capsule, Take 10 mg by mouth 4 (four) times daily -  before meals and at bedtime. As needed, Disp: , Rfl:    FLUoxetine (PROZAC) 20 MG capsule, Take 1 capsule (20 mg total) by mouth daily., Disp: 90 capsule, Rfl: 1   furosemide (LASIX) 20 MG tablet, Take 1 tablet (20 mg total) by mouth in the morning., Disp: 90 tablet, Rfl: 1   gabapentin (NEURONTIN) 600 MG tablet, Take 1 tablet (600 mg total) by mouth 3 (three) times daily., Disp: 270 tablet, Rfl: 1   Glucosamine-Chondroitin (COSAMIN DS PO), Take 2 tablets by mouth in the morning., Disp: , Rfl:    losartan (COZAAR) 100 MG tablet, Take  1 tablet (100 mg total) by mouth in the morning., Disp: 90 tablet, Rfl: 1   Multiple Vitamin (MULTIVITAMIN WITH MINERALS) TABS tablet, Take 1 tablet by mouth in the morning. One A Day for Women 50+, Disp: , Rfl:    Multiple Vitamins-Minerals (MACULAR HEALTH FORMULA PO), Take 1 capsule by mouth in the morning and at bedtime. Morning & afternoon, Disp: , Rfl:    naproxen sodium (ALEVE) 220 MG tablet, Take 440 mg by mouth 2 (two) times daily as needed (pain.)., Disp: , Rfl:    Omega-3 Fatty Acids (FISH OIL) 1000 MG CAPS, Take 1,000 mg by mouth in the morning., Disp: , Rfl:    oxybutynin (DITROPAN-XL) 10 MG 24 hr tablet, TAKE 1 TABLET BY MOUTH AT BEDTIME, Disp: 90 tablet, Rfl: 3   Polyethyl Glycol-Propyl Glycol (LUBRICANT EYE DROPS) 0.4-0.3 % SOLN, Place 1-2 drops into both eyes 3 (three) times daily as needed (dry/irritated eyes.)., Disp: , Rfl:    triamcinolone cream (KENALOG) 0.1 %, Apply 1 application. topically daily as needed (ear eczema)., Disp: , Rfl:   Allergies  Allergen Reactions   Penicillin G Hives    Other Reaction(s): hives as child   Tetanus Immune Globulin     Other Reaction(s): severe swelling at site, upper arm   Tetanus Toxoids Other (See Comments)    Arm swelled up   ROS neg/noncontributory except as noted HPI/below      Objective:     BP (!) 150/86 (BP Location: Left Arm, Patient Position: Sitting, Cuff Size: Large)   Pulse 62   Temp (!) 97.5 F (36.4 C) (Temporal)   Resp 18   Ht 5\' 3"  (1.6 m)   Wt 221 lb 2 oz (100.3 kg)   HC 18" (45.7 cm)   SpO2 98%   BMI 39.17 kg/m  Wt Readings from Last 3 Encounters:  09/19/23 221 lb 2 oz (100.3 kg)  09/05/23 223 lb 6 oz (101.3 kg)  08/31/23 223 lb 11.2 oz (101.5 kg)    Physical Exam   Gen: WDWN NAD HEENT: NCAT, conjunctiva not injected, sclera nonicteric CARDIAC: RRR, S1S2+, no murmur. EXT:  no edema MSK: no gross abnormalities.  NEURO: A&O x3.  CN II-XII intact.  PSYCH: normal mood. Good eye contact      Assessment & Plan:  Essential hypertension  Other orders -     amLODIPine Besylate; Take 1 tablet (5 mg total) by mouth daily.  Dispense: 30 tablet; Refill: 0  Assessment and Plan    Hypertension Blood pressure remains elevated at 170-180/80-90 mmHg despite current medication. Previously discontinued furosemide due to dizziness and hypokalemia.  Currently taking losartan 100 mg. Experiences occasional dizziness, balance issues, and persistent low-level headaches. Plan to add amlodipine 5 mg to improve blood pressure control. Discussed amlodipine's mechanism and potential side efect. wears compression stockings.  Severe swelling may necessitate alternative treatments. Continue losartan 100 mg, monitor blood pressure regularly, and follow up in two weeks to assess control.  General Health Maintenance Expressed interest in returning to the gym. Advised to proceed with caution due to new medication regimen. Encouraged to resume gym activities slowly and monitor for any adverse effects from the new medication.  Follow-up Cancel April 9th follow-up appointment. Schedule comprehensive follow-up in two weeks for blood pressure check and routine follow-up.        Return in about 2 weeks (around 10/03/2023) for HTN and reg f/u and canc April appt.  Angelena Sole, MD

## 2023-10-03 ENCOUNTER — Encounter: Payer: Self-pay | Admitting: Family Medicine

## 2023-10-03 ENCOUNTER — Ambulatory Visit (INDEPENDENT_AMBULATORY_CARE_PROVIDER_SITE_OTHER): Payer: PPO | Admitting: Family Medicine

## 2023-10-03 ENCOUNTER — Other Ambulatory Visit: Payer: Self-pay | Admitting: Family Medicine

## 2023-10-03 VITALS — BP 148/86 | HR 60 | Temp 97.5°F | Resp 18 | Ht 63.0 in | Wt 219.4 lb

## 2023-10-03 DIAGNOSIS — G522 Disorders of vagus nerve: Secondary | ICD-10-CM

## 2023-10-03 DIAGNOSIS — I10 Essential (primary) hypertension: Secondary | ICD-10-CM | POA: Diagnosis not present

## 2023-10-03 DIAGNOSIS — M810 Age-related osteoporosis without current pathological fracture: Secondary | ICD-10-CM

## 2023-10-03 DIAGNOSIS — F3341 Major depressive disorder, recurrent, in partial remission: Secondary | ICD-10-CM | POA: Diagnosis not present

## 2023-10-03 MED ORDER — GABAPENTIN 600 MG PO TABS: 600.0000 mg | ORAL_TABLET | Freq: Three times a day (TID) | ORAL | 1 refills | Status: DC

## 2023-10-03 MED ORDER — AMLODIPINE BESYLATE 10 MG PO TABS: 10.0000 mg | ORAL_TABLET | Freq: Every day | ORAL | 0 refills | Status: DC

## 2023-10-03 MED ORDER — FLUOXETINE HCL 20 MG PO CAPS: 20.0000 mg | ORAL_CAPSULE | Freq: Every day | ORAL | 1 refills | Status: DC

## 2023-10-03 MED ORDER — LOSARTAN POTASSIUM 100 MG PO TABS: 100.0000 mg | ORAL_TABLET | Freq: Every morning | ORAL | 1 refills | Status: DC

## 2023-10-03 MED ORDER — BUPROPION HCL ER (XL) 300 MG PO TB24: 300.0000 mg | ORAL_TABLET | Freq: Every morning | ORAL | 1 refills | Status: DC

## 2023-10-03 NOTE — Progress Notes (Signed)
 Subjective:     Patient ID: Maria Wang, female    DOB: 03/23/1951, 73 y.o.   MRN: [id number]  Chief Complaint  Patient presents with   Medical Management of Chronic Issues    Follow-up on htn    HPI Htn,dep,predm,porosis  Discussed the use of AI scribe software for clinical note transcription with the patient, who gave verbal consent to proceed.  History of Present Illness   Maria Wang is a 73 year old female with hypertension who presents with blood pressure management issues.  She has been experiencing variable blood pressure readings over the last two weeks, with some readings in the 160s/70s-90s range and one notably low reading of 118/49, which was associated with dizziness. She is currently taking amlodipine 5 mg and losartan, but has stopped taking furosemide. No chest pain, but she reports persistent headaches and occasional lightheadedness. Her heart rate has been in the 60s to 80s range.  She has a history of prediabetes and mentions that she has not been exercising much lately due to her partner's upcoming heart procedure, which has affected her gym attendance.  Regarding her osteoporosis, she is taking alendronate once a week and reports no stomach upset from the medication.  For her depression, she is on Wellbutrin 300 mg and fluoxetine 20 mg, and her mood is stable, although she experiences situational depression related to external events. No suicidal thoughts.  She also takes gabapentin for vagal nerve neuropathy and notes that if she stops taking it, she experiences coughing, indicating the persistence of her symptoms. She has not noticed any swelling since stopping furosemide.       There are no preventive care reminders to display for this patient.  Past Medical History:  Diagnosis Date   Allergy    Anxiety    Arthritis    Cancer (HCC)    right breast lumpectomy   Cataract    Complication of anesthesia    slow to wake up   Depression     GERD (gastroesophageal reflux disease)    Hepatitis    prb A   History of kidney stones    Hypertension    PONV (postoperative nausea and vomiting)    Sleep apnea    cpap   Thyroid disease     Past Surgical History:  Procedure Laterality Date   BREAST SURGERY Right    lumpectomy   chholecystectomy      CHOLECYSTECTOMY  1987   HERNIA REPAIR     umbilical   kn ee surgery  Left 1984   scope   LAPAROSCOPIC SMALL BOWEL RESECTION N/A 01/08/2022   Procedure: LAPAROSCOPIC SMALL BOWEL RESECTION;  Surgeon: Luretha Murphy, MD;  Location: [facility];  Service: General;  Laterality: N/A;   right breast lumpectomy   2015   SMALL INTESTINE SURGERY     divertic of small intestine   UMBILICAL HERNIA REPAIR       Current Outpatient Medications:    alendronate (FOSAMAX) 70 MG tablet, Take 1 tablet (70 mg total) by mouth every 7 (seven) days. Take with a full glass of water on an empty stomach., Disp: 12 tablet, Rfl: 3   Calcium Carb-Cholecalciferol (CALCIUM 600+D3 PO), Take 1 tablet by mouth in the morning and at bedtime., Disp: , Rfl:    cholecalciferol (VITAMIN D) 25 MCG (1000 UNIT) tablet, Take 1,000 Units by mouth in the morning., Disp: , Rfl:    Coenzyme Q10 300 MG CAPS, Take 300 mg by mouth in  the morning., Disp: , Rfl:    Cyanocobalamin (VITAMIN B-12) 2500 MCG SUBL, Place 2,500 mcg under the tongue in the morning., Disp: , Rfl:    dicyclomine (BENTYL) 10 MG capsule, Take 10 mg by mouth 4 (four) times daily -  before meals and at bedtime. As needed, Disp: , Rfl:    Glucosamine-Chondroitin (COSAMIN DS PO), Take 2 tablets by mouth in the morning., Disp: , Rfl:    Multiple Vitamin (MULTIVITAMIN WITH MINERALS) TABS tablet, Take 1 tablet by mouth in the morning. One A Day for Women 50+, Disp: , Rfl:    Multiple Vitamins-Minerals (MACULAR HEALTH FORMULA PO), Take 1 capsule by mouth in the morning and at bedtime. Morning & afternoon, Disp: , Rfl:    naproxen sodium (ALEVE) 220 MG tablet, Take 440 mg  by mouth 2 (two) times daily as needed (pain.)., Disp: , Rfl:    Omega-3 Fatty Acids (FISH OIL) 1000 MG CAPS, Take 1,000 mg by mouth in the morning., Disp: , Rfl:    oxybutynin (DITROPAN-XL) 10 MG 24 hr tablet, TAKE 1 TABLET BY MOUTH AT BEDTIME, Disp: 90 tablet, Rfl: 3   Polyethyl Glycol-Propyl Glycol (LUBRICANT EYE DROPS) 0.4-0.3 % SOLN, Place 1-2 drops into both eyes 3 (three) times daily as needed (dry/irritated eyes.)., Disp: , Rfl:    triamcinolone cream (KENALOG) 0.1 %, Apply 1 application. topically daily as needed (ear eczema)., Disp: , Rfl:    amLODipine (NORVASC) 10 MG tablet, Take 1 tablet (10 mg total) by mouth daily., Disp: 30 tablet, Rfl: 0   buPROPion (WELLBUTRIN XL) 300 MG 24 hr tablet, Take 1 tablet (300 mg total) by mouth every morning., Disp: 90 tablet, Rfl: 1   FLUoxetine (PROZAC) 20 MG capsule, Take 1 capsule (20 mg total) by mouth daily., Disp: 90 capsule, Rfl: 1   gabapentin (NEURONTIN) 600 MG tablet, Take 1 tablet (600 mg total) by mouth 3 (three) times daily., Disp: 270 tablet, Rfl: 1   losartan (COZAAR) 100 MG tablet, Take 1 tablet (100 mg total) by mouth in the morning., Disp: 90 tablet, Rfl: 1  Allergies  Allergen Reactions   Penicillin G Hives    Other Reaction(s): hives as child   Tetanus Immune Globulin     Other Reaction(s): severe swelling at site, upper arm   Tetanus Toxoids Other (See Comments)    Arm swelled up   ROS neg/noncontributory except as noted HPI/below      Objective:     BP (!) 148/86 (BP Location: Left Arm, Patient Position: Sitting, Cuff Size: Large)   Pulse 60   Temp (!) 97.5 F (36.4 C) (Temporal)   Resp 18   Ht 5\' 3"  (1.6 m)   Wt 219 lb 6 oz (99.5 kg)   SpO2 96%   BMI 38.86 kg/m  Wt Readings from Last 3 Encounters:  10/03/23 219 lb 6 oz (99.5 kg)  09/19/23 221 lb 2 oz (100.3 kg)  09/05/23 223 lb 6 oz (101.3 kg)    Physical Exam   Gen: WDWN NAD HEENT: NCAT, conjunctiva not injected, sclera nonicteric NECK:  supple, no  thyromegaly, no nodes, no carotid bruits CARDIAC: RRR, S1S2+, no murmur. DP 2+B LUNGS: CTAB. No wheezes ABDOMEN:  BS+, soft, NTND, No HSM, no masses EXT:  no edema MSK: no gross abnormalities.  NEURO: A&O x3.  CN II-XII intact.  PSYCH: normal mood. Good eye contact     Assessment & Plan:  Essential hypertension Assessment & Plan: Chronic. Not control.  Continue losartan  100 mg daily increase amlodipine to 10mg .  Message in 2 wks w/progress   Orders: -     amLODIPine Besylate; Take 1 tablet (10 mg total) by mouth daily.  Dispense: 30 tablet; Refill: 0 -     Losartan Potassium; Take 1 tablet (100 mg total) by mouth in the morning.  Dispense: 90 tablet; Refill: 1  Major depressive disorder, recurrent, in partial remission (HCC) Assessment & Plan: Chronic.  Well-controlled.  Continue Wellbutrin 300 mg daily and Prozac 20 mg daily.    Orders: -     buPROPion HCl ER (XL); Take 1 tablet (300 mg total) by mouth every morning.  Dispense: 90 tablet; Refill: 1 -     FLUoxetine HCl; Take 1 capsule (20 mg total) by mouth daily.  Dispense: 90 capsule; Refill: 1  Vagus nerve disease Assessment & Plan: Chronic.  Controlled.  Continue gabapentin 600 mg 3 times daily  Orders: -     Gabapentin; Take 1 tablet (600 mg total) by mouth 3 (three) times daily.  Dispense: 270 tablet; Refill: 1  Age-related osteoporosis without current pathological fracture Assessment & Plan: Chronic. No SE to fosamax 70mg  weekly.  continue   Assessment and Plan    Hypertension chronic.  not well controlled. Currently managed with amlodipine 5 mg and losartan 1oomg, blood pressure readings over the past two weeks show variability, with some readings in the 160s/70s-90s range and one instance of 118/49 accompanied by dizziness. Previously on furosemide, which has been discontinued. After discussing options, the decision was made to increase amlodipine to 10 mg, with monitoring for edema. Increasing amlodipine may cause  swelling, and furosemide would not alleviate this type of edema. Losartan and amlodipine together help prevent edema. Increase amlodipine to 10 mg daily, monitor blood pressure, and report readings in two weeks. Monitor for signs of edema and report if present. Send prescription for amlodipine 10 mg to [facility]. if edema, then go back to 5mg  and change losartan to valsartan 320mg   Prediabetes Working on diet and exercise, but currently not exercising due to partner's health issues. Encouraged to resume exercise. Encourage regular exercise and a healthy diet.  Osteoporosis On alendronate once a week with no reported side effects. Emphasized the importance of staying active. Continue alendronate 70 mg once weekly and encourage physical activity.  Depression On Wellbutrin 300 mg and fluoxetine 20 mg, with well-managed mood and no suicidal ideation. Acknowledged situational depression due to current events. Continue Wellbutrin 300 mg daily and fluoxetine 20 mg daily.  General Health Maintenance Discussed the need for a second COVID-19 vaccine and titers for other vaccines. Explained CDC guidelines and the likelihood of immunity from childhood diseases for those born before [birth year]. If six months have passed since the last COVID-19 vaccine, a second dose is recommended. Check if six months have passed since the last COVID-19 vaccine and administer if due. Discussed that titers for MMR are generally not necessary for those born before 1957.  Follow-up Schedule a follow-up appointment in six months. Report blood pressure readings and any issues with swelling via MyChart in two weeks.        Return in about 6 months (around 04/04/2024) for chronic follow-up.  Angelena Sole, MD

## 2023-10-03 NOTE — Assessment & Plan Note (Signed)
 Chronic. Not control.  Continue losartan 100 mg daily increase amlodipine to 10mg .  Message in 2 wks w/progress

## 2023-10-03 NOTE — Patient Instructions (Addendum)
 It was very nice to see you today!  Increase amlodipine to 10mg  Let me bp's in 2 wks   PLEASE NOTE:  If you had any lab tests please let us know if you have not heard back within a few days. You may see your results on MyChart before we have a chance to review them but we will give you a call once they are reviewed by [place]. If we ordered any referrals today, please let us know if you have not heard from their office within the next week.   Please try these tips to maintain a healthy lifestyle:  Eat most of your calories during the day when you are active. Eliminate processed foods including packaged sweets (pies, cakes, cookies), reduce intake of potatoes, white bread, white pasta, and white rice. Look for whole grain options, oat flour or almond flour.  Each meal should contain half fruits/vegetables, one quarter protein, and one quarter carbs (no bigger than a computer mouse).  Cut down on sweet beverages. This includes juice, soda, and sweet tea. Also watch fruit intake, though this is a healthier sweet option, it still contains natural sugar! Limit to 3 servings daily.  Drink at least 1 glass of water with each meal and aim for at least 8 glasses per day  Exercise at least 150 minutes every week.

## 2023-10-03 NOTE — Assessment & Plan Note (Signed)
Chronic.  Well-controlled.  Continue Wellbutrin 300 mg daily and Prozac 20 mg daily

## 2023-10-03 NOTE — Assessment & Plan Note (Signed)
 Chronic. No SE to fosamax 70mg  weekly.  continue

## 2023-10-03 NOTE — Assessment & Plan Note (Signed)
Chronic Controlled Continue gabapentin 600 mg 3 times daily 

## 2023-10-19 ENCOUNTER — Other Ambulatory Visit: Payer: Self-pay | Admitting: Family Medicine

## 2023-10-19 ENCOUNTER — Encounter: Payer: Self-pay | Admitting: Family Medicine

## 2023-10-19 DIAGNOSIS — I10 Essential (primary) hypertension: Secondary | ICD-10-CM

## 2023-10-19 MED ORDER — AMLODIPINE BESYLATE 10 MG PO TABS: 10.0000 mg | ORAL_TABLET | Freq: Every day | ORAL | 0 refills | Status: DC

## 2023-10-23 ENCOUNTER — Other Ambulatory Visit: Payer: Self-pay | Admitting: Family Medicine

## 2023-10-23 DIAGNOSIS — I10 Essential (primary) hypertension: Secondary | ICD-10-CM

## 2023-10-24 DIAGNOSIS — M1712 Unilateral primary osteoarthritis, left knee: Secondary | ICD-10-CM | POA: Diagnosis not present

## 2023-10-24 NOTE — Progress Notes (Signed)
 Surgery orders requested via Epic inbox.

## 2023-10-24 NOTE — Progress Notes (Signed)
 Anesthesia Review:  PCP: Lutricia Horsfall LOV 10/03/23  Cardiologist :  PPM/ ICD: Device Orders: Rep Notified:  Chest x-ray : 08/31/23- 1 view  EKG : 09/02/23  Echo : Stress test: Cardiac Cath :   Activity level:  Sleep Study/ CPAP : Fasting Blood Sugar :      / Checks Blood Sugar -- times a day:    Blood Thinner/ Instructions /Last Dose: ASA / Instructions/ Last Dose :

## 2023-10-25 NOTE — Patient Instructions (Signed)
 SURGICAL WAITING ROOM VISITATION  Patients having surgery or a procedure may have no more than 2 support people in the waiting area - these visitors may rotate.    Children under the age of 59 must have an adult with them who is not the patient.  Due to an increase in RSV and influenza rates and associated hospitalizations, children ages 12 and under may not visit patients in [facility] hospitals.  Visitors with respiratory illnesses are discouraged from visiting and should remain at home.  If the patient needs to stay at the hospital during part of their recovery, the visitor guidelines for inpatient rooms apply. Pre-op nurse will coordinate an appropriate time for 1 support person to accompany patient in pre-op.  This support person may not rotate.    Please refer to the [facility] website for the visitor guidelines for Inpatients (after your surgery is over and you are in a regular room).       Your procedure is scheduled on:  11/08/2023    Report to [facility] Main Entrance    Report to admitting at  0515 AM   Call this number if you have problems the morning of surgery [phone]   Do not eat food :After Midnight.   After Midnight you may have the following liquids until _ 0430_____ AM DAY OF SURGERY  Water Non-Citrus Juices (without pulp, NO RED-Apple, White grape, White cranberry) Black Coffee (NO MILK/CREAM OR CREAMERS, sugar ok)  Clear Tea (NO MILK/CREAM OR CREAMERS, sugar ok) regular and decaf                             Plain Jell-O (NO RED)                                           Fruit ices (not with fruit pulp, NO RED)                                     Popsicles (NO RED)                                                               Sports drinks like Gatorad       The day of surgery:  Drink ONE (1) Pre-Surgery Clear Ensure or G2 at 0430 AM  ( have completed by ) the morning of surgery. Drink in one sitting. Do not sip.  This drink was given to you  during your hospital  pre-op appointment visit. Nothing else to drink after completing the  Pre-Surgery Clear Ensure or G2.          If you have questions, please contact your surgeon's office.       Oral Hygiene is also important to reduce your risk of infection.                                    Remember - BRUSH YOUR TEETH THE MORNING OF SURGERY WITH YOUR REGULAR  TOOTHPASTE  DENTURES WILL BE REMOVED PRIOR TO SURGERY PLEASE DO NOT APPLY "Poly grip" OR ADHESIVES!!!   Do NOT smoke after Midnight   Stop all vitamins and herbal supplements 7 days before surgery.   Take these medicines the morning of surgery with A SIP OF WATER:  amlodipine, wellbutrin, prozac, gabapentin   DO NOT TAKE ANY ORAL DIABETIC MEDICATIONS DAY OF YOUR SURGERY  Bring CPAP mask and tubing day of surgery.                              You may not have any metal on your body including hair pins, jewelry, and body piercing             Do not wear make-up, lotions, powders, perfumes/cologne, or deodorant  Do not wear nail polish including gel and S&S, artificial/acrylic nails, or any other type of covering on natural nails including finger and toenails. If you have artificial nails, gel coating, etc. that needs to be removed by a nail salon please have this removed prior to surgery or surgery may need to be canceled/ delayed if the surgeon/ anesthesia feels like they are unable to be safely monitored.   Do not shave  48 hours prior to surgery.               Men may shave face and neck.   Do not bring valuables to the hospital.  IS NOT             RESPONSIBLE   FOR VALUABLES.   Contacts, glasses, dentures or bridgework may not be worn into surgery.   Bring small overnight bag day of surgery.   DO NOT BRING YOUR HOME MEDICATIONS TO THE HOSPITAL. PHARMACY WILL DISPENSE MEDICATIONS LISTED ON YOUR MEDICATION LIST TO YOU DURING YOUR ADMISSION IN THE HOSPITAL!    Patients discharged on the day of  surgery will not be allowed to drive home.  Someone NEEDS to stay with you for the first 24 hours after anesthesia.   Special Instructions: Bring a copy of your healthcare power of attorney and living will documents the day of surgery if you haven't scanned them before.              Please read over the following fact sheets you were given: IF YOU HAVE QUESTIONS ABOUT YOUR PRE-OP INSTRUCTIONS PLEASE CALL [phone]   If you received a COVID test during your pre-op visit  it is requested that you wear a mask when out in public, stay away from anyone that may not be feeling well and notify your surgeon if you develop symptoms. If you test positive for Covid or have been in contact with anyone that has tested positive in the last 10 days please notify you surgeon.      Pre-operative 5 CHG Bath Instructions   You can play a key role in reducing the risk of infection after surgery. Your skin needs to be as free of germs as possible. You can reduce the number of germs on your skin by washing with CHG (chlorhexidine gluconate) soap before surgery. CHG is an antiseptic soap that kills germs and continues to kill germs even after washing.   DO NOT use if you have an allergy to chlorhexidine/CHG or antibacterial soaps. If your skin becomes reddened or irritated, stop using the CHG and notify one of our RNs at [phone].   Please shower with the CHG soap  starting 4 days before surgery using the following schedule:     Please keep in mind the following:  DO NOT shave, including legs and underarms, starting the day of your first shower.   You may shave your face at any point before/day of surgery.  Place clean sheets on your bed the day you start using CHG soap. Use a clean washcloth (not used since being washed) for each shower. DO NOT sleep with pets once you start using the CHG.   CHG Shower Instructions:  If you choose to wash your hair and private area, wash first with your normal  shampoo/soap.  After you use shampoo/soap, rinse your hair and body thoroughly to remove shampoo/soap residue.  Turn the water OFF and apply about 3 tablespoons (45 ml) of CHG soap to a CLEAN washcloth.  Apply CHG soap ONLY FROM YOUR NECK DOWN TO YOUR TOES (washing for 3-5 minutes)  DO NOT use CHG soap on face, private areas, open wounds, or sores.  Pay special attention to the area where your surgery is being performed.  If you are having back surgery, having someone wash your back for you may be helpful. Wait 2 minutes after CHG soap is applied, then you may rinse off the CHG soap.  Pat dry with a clean towel  Put on clean clothes/pajamas   If you choose to wear lotion, please use ONLY the CHG-compatible lotions on the back of this paper.     Additional instructions for the day of surgery: DO NOT APPLY any lotions, deodorants, cologne, or perfumes.   Put on clean/comfortable clothes.  Brush your teeth.  Ask your nurse before applying any prescription medications to the skin.      CHG Compatible Lotions   Aveeno Moisturizing lotion  Cetaphil Moisturizing Cream  Cetaphil Moisturizing Lotion  Clairol Herbal Essence Moisturizing Lotion, Dry Skin  Clairol Herbal Essence Moisturizing Lotion, Extra Dry Skin  Clairol Herbal Essence Moisturizing Lotion, Normal Skin  Curel Age Defying Therapeutic Moisturizing Lotion with Alpha Hydroxy  Curel Extreme Care Body Lotion  Curel Soothing Hands Moisturizing Hand Lotion  Curel Therapeutic Moisturizing Cream, Fragrance-Free  Curel Therapeutic Moisturizing Lotion, Fragrance-Free  Curel Therapeutic Moisturizing Lotion, Original Formula  Eucerin Daily Replenishing Lotion  Eucerin Dry Skin Therapy Plus Alpha Hydroxy Crme  Eucerin Dry Skin Therapy Plus Alpha Hydroxy Lotion  Eucerin Original Crme  Eucerin Original Lotion  Eucerin Plus Crme Eucerin Plus Lotion  Eucerin TriLipid Replenishing Lotion  Keri Anti-Bacterial Hand Lotion  Keri Deep  Conditioning Original Lotion Dry Skin Formula Softly Scented  Keri Deep Conditioning Original Lotion, Fragrance Free Sensitive Skin Formula  Keri Lotion Fast Absorbing Fragrance Free Sensitive Skin Formula  Keri Lotion Fast Absorbing Softly Scented Dry Skin Formula  Keri Original Lotion  Keri Skin Renewal Lotion Keri Silky Smooth Lotion  Keri Silky Smooth Sensitive Skin Lotion  Nivea Body Creamy Conditioning Oil  Nivea Body Extra Enriched Teacher, adult education Moisturizing Lotion Nivea Crme  Nivea Skin Firming Lotion  NutraDerm 30 Skin Lotion  NutraDerm Skin Lotion  NutraDerm Therapeutic Skin Cream  NutraDerm Therapeutic Skin Lotion  ProShield Protective Hand Cream  Provon moisturizing lotion

## 2023-10-26 NOTE — Care Plan (Signed)
 Ortho Bundle Case Management Note  Patient Details  Name: Maria Wang MRN: [id number] Date of Birth: 08/11/1950  met with patient in the office for H&P. will discharge to home with family to assist. has DME. HHPT referral to [facility] and OPPT set up with [facility]. discharge instructions discussed and questions answered. Patient and MD in agreement with plan. Choice offered.            DME Arranged:    DME Agency:     HH Arranged:  PT HH Agency:  [facility]  Additional Comments: Please contact me with any questions of if this plan should need to change.  Shauna Hugh,  RN,BSN,MHA,CCM  [facility]  [phone] 10/26/2023, 11:09 AM

## 2023-10-27 ENCOUNTER — Encounter (HOSPITAL_COMMUNITY): Payer: Self-pay

## 2023-10-27 ENCOUNTER — Encounter (HOSPITAL_COMMUNITY)
Admission: RE | Admit: 2023-10-27 | Discharge: 2023-10-27 | Disposition: A | Discharge status: Home / Self Care | Source: Ambulatory Visit | Attending: Orthopedic Surgery | Admitting: Orthopedic Surgery

## 2023-10-27 ENCOUNTER — Other Ambulatory Visit: Payer: Self-pay

## 2023-10-27 VITALS — BP 141/78 | HR 59 | Temp 98.3°F | Resp 16 | Ht 63.0 in | Wt 217.0 lb

## 2023-10-27 DIAGNOSIS — Z01818 Encounter for other preprocedural examination: Secondary | ICD-10-CM

## 2023-10-27 DIAGNOSIS — F419 Anxiety disorder, unspecified: Secondary | ICD-10-CM | POA: Diagnosis not present

## 2023-10-27 DIAGNOSIS — M1712 Unilateral primary osteoarthritis, left knee: Secondary | ICD-10-CM | POA: Diagnosis not present

## 2023-10-27 DIAGNOSIS — F32A Depression, unspecified: Secondary | ICD-10-CM | POA: Insufficient documentation

## 2023-10-27 DIAGNOSIS — Z853 Personal history of malignant neoplasm of breast: Secondary | ICD-10-CM | POA: Insufficient documentation

## 2023-10-27 DIAGNOSIS — I10 Essential (primary) hypertension: Secondary | ICD-10-CM | POA: Insufficient documentation

## 2023-10-27 DIAGNOSIS — Z79899 Other long term (current) drug therapy: Secondary | ICD-10-CM | POA: Insufficient documentation

## 2023-10-27 DIAGNOSIS — G4733 Obstructive sleep apnea (adult) (pediatric): Secondary | ICD-10-CM | POA: Insufficient documentation

## 2023-10-27 DIAGNOSIS — Z01812 Encounter for preprocedural laboratory examination: Secondary | ICD-10-CM | POA: Insufficient documentation

## 2023-10-27 LAB — COMPREHENSIVE METABOLIC PANEL WITH GFR
ALT: 22 U/L (ref 0–44)
AST: 16 U/L (ref 15–41)
Albumin: 3.7 g/dL (ref 3.5–5.0)
Alkaline Phosphatase: 57 U/L (ref 38–126)
Anion gap: 9 (ref 5–15)
BUN: 36 mg/dL — ABNORMAL HIGH (ref 8–23)
CO2: 25 mmol/L (ref 22–32)
Calcium: 9.1 mg/dL (ref 8.9–10.3)
Chloride: 105 mmol/L (ref 98–111)
Creatinine, Ser: 0.78 mg/dL (ref 0.44–1.00)
GFR, Estimated: >60 mL/min (ref >60)
Glucose, Bld: 99 mg/dL (ref 70–99)
Potassium: 4.7 mmol/L (ref 3.5–5.1)
Sodium: 139 mmol/L (ref 135–145)
Total Bilirubin: 0.5 mg/dL (ref 0.0–1.2)
Total Protein: 6.8 g/dL (ref 6.5–8.1)

## 2023-10-27 LAB — CBC
HCT: 43.5 % (ref 36.0–46.0)
Hemoglobin: 13.9 g/dL (ref 12.0–15.0)
MCH: 30.9 pg (ref 26.0–34.0)
MCHC: 32 g/dL (ref 30.0–36.0)
MCV: 96.7 fL (ref 80.0–100.0)
Platelets: 271 10*3/uL (ref 150–400)
RBC: 4.5 MIL/uL (ref 3.87–5.11)
RDW: 14 % (ref 11.5–15.5)
WBC: 7.1 10*3/uL (ref 4.0–10.5)
nRBC: 0 % (ref 0.0–0.2)

## 2023-10-27 LAB — SURGICAL PCR SCREEN
MRSA, PCR: NEGATIVE
Staphylococcus aureus: NEGATIVE

## 2023-10-31 ENCOUNTER — Encounter (HOSPITAL_COMMUNITY): Payer: Self-pay

## 2023-10-31 ENCOUNTER — Other Ambulatory Visit: Payer: Self-pay | Admitting: Family Medicine

## 2023-10-31 NOTE — Anesthesia Preprocedure Evaluation (Addendum)
 Anesthesia Evaluation  Patient identified by MRN, date of birth, ID band Patient awake    Reviewed: Allergy & Precautions, H&P , NPO status , Patient's Chart, lab work & pertinent test results  History of Anesthesia Complications (+) PONV, PROLONGED EMERGENCE and history of anesthetic complications  Airway Mallampati: II  TM Distance: >3 FB Neck ROM: Full    Dental no notable dental hx. (+) Teeth Intact, Dental Advisory Given   Pulmonary neg pulmonary ROS, sleep apnea and Continuous Positive Airway Pressure Ventilation , former smoker   Pulmonary exam normal breath sounds clear to auscultation       Cardiovascular Exercise Tolerance: Good hypertension, Pt. on medications Normal cardiovascular exam Rhythm:Regular Rate:Normal     Neuro/Psych  PSYCHIATRIC DISORDERS Anxiety Depression     Neuromuscular disease  negative psych ROS   GI/Hepatic ,GERD  Medicated and Controlled,,(+) Hepatitis -Remote hepatitis    Endo/Other  negative endocrine ROS    Renal/GU negative Renal ROS  negative genitourinary   Musculoskeletal negative musculoskeletal ROS (+) Arthritis , Osteoarthritis,    Abdominal   Peds negative pediatric ROS (+)  Hematology negative hematology ROS (+)   Anesthesia Other Findings   Reproductive/Obstetrics negative OB ROS                             Anesthesia Physical Anesthesia Plan  ASA: 3  Anesthesia Plan: MAC, Regional and Spinal   Post-op Pain Management: Tylenol PO (pre-op)*   Induction: Intravenous  PONV Risk Score and Plan: 4 or greater and Ondansetron, Treatment may vary due to age or medical condition, TIVA and Propofol infusion  Airway Management Planned: Natural Airway and Simple Face Mask  Additional Equipment: None  Intra-op Plan:   Post-operative Plan:   Informed Consent: I have reviewed the patients History and Physical, chart, labs and discussed the  procedure including the risks, benefits and alternatives for the proposed anesthesia with the patient or authorized representative who has indicated his/her understanding and acceptance.       Plan Discussed with: Anesthesiologist and CRNA  Anesthesia Plan Comments: (See PAT visit on 3/27   DISCUSSION: Maria Wang is a 73 yo female who presents to PAT prior to surgery above. PMH of former smoking, HTN, OSA (no CPAP use), GERD, remote hx of Hep A, breast cancer s/p right breast lumpectomy (2015), arthritis, anxiety, depression.   Prior anesthesia complication includes PONV, prolonged emergence   Last seen by PCP on 10/03/23. BP noted to be not well controlled. Her Amlodipine dose was increased. The patient kept a log of her BP readings and it was better controlled and has continued on the higher dose. Otherwise stable at that visit.   )        Anesthesia Quick Evaluation

## 2023-10-31 NOTE — Progress Notes (Signed)
 Case: [id number] Date/Time: 11/08/23 1330   Procedure: ARTHROPLASTY, KNEE, TOTAL (Left: Knee)   Anesthesia type: Spinal   Pre-op diagnosis: left knee OA   Location: [facility] ROOM 08 / [facility]   Surgeons: Teryl Lucy, MD       DISCUSSION: Maria Wang is a 73 yo female who presents to PAT prior to surgery above. PMH of former smoking, HTN, OSA (no CPAP use), GERD, remote hx of Hep A, breast cancer s/p right breast lumpectomy (2015), arthritis, anxiety, depression.  Prior anesthesia complication includes PONV, prolonged emergence  Last seen by PCP on 10/03/23. BP noted to be not well controlled. Her Amlodipine dose was increased. The patient kept a log of her BP readings and it was better controlled and has continued on the higher dose. Otherwise stable at that visit.  VS: BP (!) 141/78   Pulse (!) 59   Temp 36.8 C (Oral)   Resp 16   Ht 5\' 3"  (1.6 m)   Wt 98.4 kg   SpO2 99%   BMI 38.44 kg/m   PROVIDERS: Jeani Sow, MD   LABS: Labs reviewed: Acceptable for surgery. (all labs ordered are listed, but only abnormal results are displayed)  Labs Reviewed  COMPREHENSIVE METABOLIC PANEL WITH GFR - Abnormal; Notable for the following components:      Result Value   BUN 36 (*)    All other components within normal limits  SURGICAL PCR SCREEN  CBC     IMAGES: CXR 08/31/23:  FINDINGS: No consolidation, pneumothorax or effusion. No edema. Normal cardiopericardial silhouette. Overlapping cardiac leads. Degenerative changes of the spine. Surgical clips in the right upper quadrant of the abdomen.   IMPRESSION: No acute cardiopulmonary disease.  EKG:   CV:  Past Medical History:  Diagnosis Date   Allergy    Anxiety    Arthritis    Cancer (HCC)    right breast lumpectomy   Cataract    Complication of anesthesia    slow to wake up   Depression    Hepatitis    prb A   History of kidney stones    Hypertension    PONV (postoperative nausea and vomiting)    Sleep  apnea    no longr uses cpap   Thyroid disease     Past Surgical History:  Procedure Laterality Date   BREAST SURGERY Right    lumpectomy   chholecystectomy      CHOLECYSTECTOMY  1987   HERNIA REPAIR     umbilical   kn ee surgery  Left 1984   scope   LAPAROSCOPIC SMALL BOWEL RESECTION N/A 01/08/2022   Procedure: LAPAROSCOPIC SMALL BOWEL RESECTION;  Surgeon: Luretha Murphy, MD;  Location: [facility];  Service: General;  Laterality: N/A;   right breast lumpectomy   2015   SMALL INTESTINE SURGERY     divertic of small intestine   UMBILICAL HERNIA REPAIR      MEDICATIONS:  alendronate (FOSAMAX) 70 MG tablet   amLODipine (NORVASC) 10 MG tablet   buPROPion (WELLBUTRIN XL) 300 MG 24 hr tablet   Calcium Carb-Cholecalciferol (CALCIUM 600+D3 PO)   cholecalciferol (VITAMIN D) 25 MCG (1000 UNIT) tablet   Coenzyme Q10 300 MG CAPS   Cyanocobalamin (VITAMIN B-12) 2500 MCG SUBL   dicyclomine (BENTYL) 10 MG capsule   FLUoxetine (PROZAC) 20 MG capsule   gabapentin (NEURONTIN) 600 MG tablet   Glucosamine-Chondroitin (COSAMIN DS PO)   losartan (COZAAR) 100 MG tablet   MELATONIN PO   Multiple  Vitamin (MULTIVITAMIN WITH MINERALS) TABS tablet   Multiple Vitamins-Minerals (MACULAR HEALTH FORMULA PO)   naproxen sodium (ALEVE) 220 MG tablet   Omega-3 Fatty Acids (FISH OIL) 1000 MG CAPS   oxybutynin (DITROPAN-XL) 10 MG 24 hr tablet   Polyethyl Glycol-Propyl Glycol (LUBRICANT EYE DROPS) 0.4-0.3 % SOLN   triamcinolone cream (KENALOG) 0.1 %   No current facility-administered medications for this encounter.   Marcille Blanco [facility] Short Stay/Anesthesiology [facility] Phone [phone] 10/31/2023 2:26 PM

## 2023-10-31 NOTE — Telephone Encounter (Unsigned)
 Copied from CRM [id number]. Topic: Clinical - Medication Refill >> Oct 31, 2023 10:21 AM Marya Fossa L wrote: Most Recent Primary Care Visit:  Provider: Ruthine Dose, ANN Rumi  Department: [facility]  Visit Type: OFFICE VISIT  Date: 10/03/2023  Medication:amLODipine (NORVASC) 10 MG tablet  Has the patient contacted their pharmacy? Yes (Agent: If no, request that the patient contact the pharmacy for the refill. If patient does not wish to contact the pharmacy document the reason why and proceed with request.) (Agent: If yes, when and what did the pharmacy advise?)  Is this the correct pharmacy for this prescription? Yes If no, delete pharmacy and type the correct one.  This is the patient's preferred pharmacy:  [facility] - [place], [place] - [address] [place] [address] Phone: [phone] Fax: [phone]   Has the prescription been filled recently? Yes  Is the patient out of the medication? Yes  Has the patient been seen for an appointment in the last year OR does the patient have an upcoming appointment? Yes  Can we respond through MyChart? Yes  Agent: Please be advised that Rx refills may take up to 3 business days. We ask that you follow-up with your pharmacy.

## 2023-11-07 NOTE — H&P (Signed)
 KNEE ARTHROPLASTY ADMISSION H&P  Patient ID: Maria Wang MRN: [id number] DOB/AGE: [birth year] 73 y.o.  Chief Complaint: left knee pain.  Planned Procedure Date: 11/08/23 Medical Clearance by Dr. Ruthine Dose     HPI: Maria Wang is a 73 y.o. female who presents for evaluation of left knee OA. The patient has a history of pain and functional disability in the left knee due to arthritis and has failed non-surgical conservative treatments for greater than 12 weeks to include NSAID's and/or analgesics, corticosteriod injections, weight reduction as appropriate, and activity modification.  Onset of symptoms was gradual, starting >10 years ago with gradually worsening course since that time. The patient noted prior procedures on the knee to include  arthroscopy and menisectomy on the left knee.  Patient currently rates pain at 3 out of 10 with activity. Patient has worsening of pain with activity and weight bearing and pain that interferes with activities of daily living.  Patient has evidence of joint space narrowing by imaging studies.  There is no active infection.  Past Medical History:  Diagnosis Date   Allergy    Anxiety    Arthritis    Cancer (HCC)    right breast lumpectomy   Cataract    Complication of anesthesia    slow to wake up   Depression    Hepatitis    prb A   History of kidney stones    Hypertension    PONV (postoperative nausea and vomiting)    Sleep apnea    no longr uses cpap   Thyroid disease    Past Surgical History:  Procedure Laterality Date   BREAST SURGERY Right    lumpectomy   chholecystectomy      CHOLECYSTECTOMY  1987   HERNIA REPAIR     umbilical   kn ee surgery  Left 1984   scope   LAPAROSCOPIC SMALL BOWEL RESECTION N/A 01/08/2022   Procedure: LAPAROSCOPIC SMALL BOWEL RESECTION;  Surgeon: Luretha Murphy, MD;  Location[facility];  Service: General;  Laterality: N/A;   right breast lumpectomy   2015   SMALL INTESTINE SURGERY     divertic of  small intestine   UMBILICAL HERNIA REPAIR     Allergies  Allergen Reactions   Penicillin G Hives    Other Reaction(s): hives as child   Tetanus Immune Globulin Swelling    Other Reaction(s): severe swelling at site, upper arm   Tetanus Toxoids Swelling    Arm swelled up   Prior to Admission medications   Medication Sig Start Date End Date Taking? Authorizing Provider  alendronate (FOSAMAX) 70 MG tablet Take 1 tablet (70 mg total) by mouth every 7 (seven) days. Take with a full glass of water on an empty stomach. 05/31/23  Yes Jeani Sow, MD  amLODipine (NORVASC) 10 MG tablet Take 1 tablet (10 mg total) by mouth daily. 10/19/23  Yes Jeani Sow, MD  buPROPion (WELLBUTRIN XL) 300 MG 24 hr tablet Take 1 tablet (300 mg total) by mouth every morning. 10/03/23  Yes Jeani Sow, MD  Calcium Carb-Cholecalciferol (CALCIUM 600+D3 PO) Take 1 tablet by mouth in the morning and at bedtime.   Yes [provider]  cholecalciferol (VITAMIN D) 25 MCG (1000 UNIT) tablet Take 1,000 Units by mouth in the morning.   Yes [provider]  Coenzyme Q10 300 MG CAPS Take 300 mg by mouth in the morning.   Yes [provider]  Cyanocobalamin (VITAMIN B-12) 2500 MCG SUBL Place  2,500 mcg under the tongue in the morning.   Yes [provider]  dicyclomine (BENTYL) 10 MG capsule Take 10 mg by mouth 4 (four) times daily as needed for spasms.   Yes [provider]  FLUoxetine (PROZAC) 20 MG capsule Take 1 capsule (20 mg total) by mouth daily. 10/03/23  Yes Jeani Sow, MD  gabapentin (NEURONTIN) 600 MG tablet Take 1 tablet (600 mg total) by mouth 3 (three) times daily. 10/03/23  Yes Jeani Sow, MD  Glucosamine-Chondroitin (COSAMIN DS PO) Take 2 tablets by mouth in the morning.   Yes [provider]  losartan (COZAAR) 100 MG tablet Take 1 tablet (100 mg total) by mouth in the morning. 10/03/23  Yes Jeani Sow, MD  MELATONIN PO Take 1 tablet by  mouth at bedtime as needed (sleep).   Yes [provider]  Multiple Vitamin (MULTIVITAMIN WITH MINERALS) TABS tablet Take 1 tablet by mouth in the morning. One A Day for Women 50+   Yes [provider]  Multiple Vitamins-Minerals (MACULAR HEALTH FORMULA PO) Take 1 capsule by mouth in the morning and at bedtime.   Yes [provider]  naproxen sodium (ALEVE) 220 MG tablet Take 440 mg by mouth 2 (two) times daily as needed (pain.).   Yes [provider]  Omega-3 Fatty Acids (FISH OIL) 1000 MG CAPS Take 1,000 mg by mouth in the morning.   Yes [provider]  oxybutynin (DITROPAN-XL) 10 MG 24 hr tablet TAKE 1 TABLET BY MOUTH AT BEDTIME 06/07/23  Yes Jeani Sow, MD  Polyethyl Glycol-Propyl Glycol (LUBRICANT EYE DROPS) 0.4-0.3 % SOLN Place 1-2 drops into both eyes 3 (three) times daily as needed (dry/irritated eyes.).   Yes [provider]  triamcinolone cream (KENALOG) 0.1 % Apply 1 application. topically daily as needed (ear eczema).   Yes [provider]   Social History   Socioeconomic History   Marital status: Married    Spouse name: Not on file   Number of children: 3   Years of education: Not on file   Highest education level: Not on file  Occupational History   Not on file  Tobacco Use   Smoking status: Former    Current packs/day: 0.00    Average packs/day: 2.0 packs/day for 5.0 years (10.0 ttl pk-yrs)    Types: Cigarettes    Start date: 07/02/1970    Quit date: 07/03/1975    Years since quitting: 48.3   Smokeless tobacco: Never  Vaping Use   Vaping status: Never Used  Substance and Sexual Activity   Alcohol use: Yes    Alcohol/week: 8.0 - 10.0 standard drinks of alcohol    Types: 8 - 10 Shots of liquor per week    Comment: on occas   Drug use: Not Currently    Types: Marijuana    Comment: hx of as a teenager   Sexual activity: Not Currently    Birth control/protection: Abstinence  Other Topics Concern    Not on file  Social History Narrative   [id number]-twins(boy and girl)      Retired.  Religious educator   Social Drivers of Health   Financial Resource Strain: Low Risk  (01/06/2023)   Overall Financial Resource Strain (CARDIA)    Difficulty of Paying Living Expenses: Not hard at all  Food Insecurity: No Food Insecurity (01/06/2023)   Hunger Vital Sign    Worried About Running Out of Food in the Last Year: Never true  Ran Out of Food in the Last Year: Never true  Transportation Needs: No Transportation Needs (01/06/2023)   PRAPARE - Administrator, Civil Service (Medical): No    Lack of Transportation (Non-Medical): No  Physical Activity: Inactive (01/06/2023)   Exercise Vital Sign    Days of Exercise per Week: 0 days    Minutes of Exercise per Session: 0 min  Stress: No Stress Concern Present (01/06/2023)   [employer] of Occupational Health - Occupational Stress Questionnaire    Feeling of Stress : Not at all  Social Connections: Moderately Integrated (01/06/2023)   Social Connection and Isolation Panel [NHANES]    Frequency of Communication with Friends and Family: More than three times a week    Frequency of Social Gatherings with Friends and Family: More than three times a week    Attends Religious Services: More than 4 times per year    Active Member of [employer] or Organizations: No    Attends Engineer, structural: Never    Marital Status: Married   Family History  Problem Relation Age of Onset   Arthritis Mother    Miscarriages / [place] Mother    Alzheimer's disease Mother    Cancer Father    Depression Sister    ADD / ADHD Brother    Alcohol abuse Brother    Drug abuse Brother    Alzheimer's disease Maternal Aunt    Diabetes Maternal Grandmother    Diabetes Paternal Grandmother    Obesity Paternal Grandmother     ROS: Currently denies lightheadedness, dizziness, Fever, chills, CP, SOB.   No personal history of DVT, PE, MI, or CVA. No loose  teeth or dentures All other systems have been reviewed and were otherwise currently negative with the exception of those mentioned in the HPI and as above.  Objective: Vitals: H 5'3", W 221.4 pounds, BMI 39.1, temp 97.3, BP 120/76, P 69, O2 96% on room air.   Physical Exam: General: Alert, NAD.  Antalgic Gait  HEENT: EOMI, Good Neck Extension  Pulm: No increased work of breathing.  Clear B/L A/P w/o crackle or wheeze.  CV: RRR, No m/g/r appreciated  GI: soft, NT, ND Neuro: Neuro without gross focal deficit.  Sensation intact distally Skin: No lesions in the area of chief complaint MSK/Surgical Site: She has 0-100 degrees range-of-motion of her left knee.  She has positive lateral joint line tenderness, negative effusion.  EHL and FHL are intact.  Distal sensation is intact.     Imaging Review Plain radiographs demonstrate severe degenerative joint disease of the left knee.   The bone quality appears to be adequate for age and reported activity level.  Preoperative templating of the joint replacement has been completed, documented, and submitted to the Operating Room personnel in order to optimize intra-operative equipment management.  Assessment: left knee OA   Plan: Plan for Procedure(s): ARTHROPLASTY, KNEE, TOTAL  The patient history, physical exam, clinical judgement of the provider and imaging are consistent with end stage degenerative joint disease and total joint arthroplasty is deemed medically necessary. The treatment options including medical management, injection therapy, and arthroplasty were discussed at length. The risks and benefits of Procedure(s): ARTHROPLASTY, KNEE, TOTAL were presented and reviewed.  The risks of nonoperative treatment, versus surgical intervention including but not limited to continued pain, aseptic loosening, stiffness, dislocation/subluxation, infection, bleeding, nerve injury, blood clots, cardiopulmonary complications, morbidity, mortality,  among others were discussed. The patient verbalizes understanding and wishes to proceed with the  plan.  Patient is being admitted for inpatient treatment for surgery, pain control, PT, prophylactic antibiotics, VTE prophylaxis, progressive ambulation, ADL's and discharge planning.    The patient does meet the criteria for TXA which will be used perioperatively.   ASA 325 mg will be used postoperatively for DVT prophylaxis in addition to SCDs, and early ambulation. The patient is planning to be discharged home with HHPT in care of husband   Armida Sans, Cordelia Poche 11/07/2023 1:46 PM

## 2023-11-08 ENCOUNTER — Encounter (HOSPITAL_COMMUNITY): Payer: Self-pay | Admitting: Orthopedic Surgery

## 2023-11-08 ENCOUNTER — Inpatient Hospital Stay (HOSPITAL_COMMUNITY)
Admission: AD | Admit: 2023-11-08 | Discharge: 2023-11-12 | DRG: 470 | Disposition: A | Discharge status: Home Health | Payer: PPO | Attending: Orthopedic Surgery | Admitting: Orthopedic Surgery

## 2023-11-08 ENCOUNTER — Ambulatory Visit (HOSPITAL_COMMUNITY): Payer: Self-pay | Admitting: Medical

## 2023-11-08 ENCOUNTER — Encounter (HOSPITAL_COMMUNITY)
Admission: AD | Disposition: A | Discharge status: Home Health | Payer: Self-pay | Source: Home / Self Care | Attending: Orthopedic Surgery

## 2023-11-08 ENCOUNTER — Other Ambulatory Visit: Payer: Self-pay

## 2023-11-08 ENCOUNTER — Observation Stay (HOSPITAL_COMMUNITY)

## 2023-11-08 ENCOUNTER — Ambulatory Visit (HOSPITAL_COMMUNITY): Payer: Self-pay | Admitting: Certified Registered"

## 2023-11-08 DIAGNOSIS — M1712 Unilateral primary osteoarthritis, left knee: Secondary | ICD-10-CM | POA: Diagnosis not present

## 2023-11-08 DIAGNOSIS — R609 Edema, unspecified: Secondary | ICD-10-CM | POA: Diagnosis not present

## 2023-11-08 DIAGNOSIS — G8918 Other acute postprocedural pain: Secondary | ICD-10-CM | POA: Diagnosis not present

## 2023-11-08 DIAGNOSIS — Z811 Family history of alcohol abuse and dependence: Secondary | ICD-10-CM

## 2023-11-08 DIAGNOSIS — Z813 Family history of other psychoactive substance abuse and dependence: Secondary | ICD-10-CM

## 2023-11-08 DIAGNOSIS — K219 Gastro-esophageal reflux disease without esophagitis: Secondary | ICD-10-CM | POA: Diagnosis present

## 2023-11-08 DIAGNOSIS — Z87891 Personal history of nicotine dependence: Secondary | ICD-10-CM

## 2023-11-08 DIAGNOSIS — G4733 Obstructive sleep apnea (adult) (pediatric): Secondary | ICD-10-CM | POA: Diagnosis not present

## 2023-11-08 DIAGNOSIS — Z833 Family history of diabetes mellitus: Secondary | ICD-10-CM

## 2023-11-08 DIAGNOSIS — Z96652 Presence of left artificial knee joint: Secondary | ICD-10-CM | POA: Diagnosis not present

## 2023-11-08 DIAGNOSIS — I959 Hypotension, unspecified: Secondary | ICD-10-CM | POA: Diagnosis not present

## 2023-11-08 DIAGNOSIS — Z818 Family history of other mental and behavioral disorders: Secondary | ICD-10-CM

## 2023-11-08 DIAGNOSIS — Z01818 Encounter for other preprocedural examination: Secondary | ICD-10-CM

## 2023-11-08 DIAGNOSIS — Z8261 Family history of arthritis: Secondary | ICD-10-CM

## 2023-11-08 DIAGNOSIS — Z820 Family history of epilepsy and other diseases of the nervous system: Secondary | ICD-10-CM

## 2023-11-08 DIAGNOSIS — Z880 Allergy status to penicillin: Secondary | ICD-10-CM

## 2023-11-08 DIAGNOSIS — Z471 Aftercare following joint replacement surgery: Secondary | ICD-10-CM | POA: Diagnosis not present

## 2023-11-08 DIAGNOSIS — Z9049 Acquired absence of other specified parts of digestive tract: Secondary | ICD-10-CM

## 2023-11-08 DIAGNOSIS — Z79899 Other long term (current) drug therapy: Secondary | ICD-10-CM

## 2023-11-08 DIAGNOSIS — I10 Essential (primary) hypertension: Secondary | ICD-10-CM

## 2023-11-08 DIAGNOSIS — F418 Other specified anxiety disorders: Secondary | ICD-10-CM | POA: Diagnosis not present

## 2023-11-08 DIAGNOSIS — Z887 Allergy status to serum and vaccine status: Secondary | ICD-10-CM

## 2023-11-08 DIAGNOSIS — Z7983 Long term (current) use of bisphosphonates: Secondary | ICD-10-CM

## 2023-11-08 HISTORY — PX: TOTAL KNEE ARTHROPLASTY: SHX125

## 2023-11-08 SURGERY — ARTHROPLASTY, KNEE, TOTAL
Anesthesia: Monitor Anesthesia Care | Site: Knee | Laterality: Left

## 2023-11-08 MED ORDER — ONDANSETRON HCL 4 MG/2ML IJ SOLN: 4.0000 mg | Freq: Four times a day (QID) | INTRAMUSCULAR | Status: DC | PRN

## 2023-11-08 MED ORDER — METOCLOPRAMIDE HCL 5 MG PO TABS: 5.0000 mg | ORAL_TABLET | Freq: Three times a day (TID) | ORAL | Status: DC | PRN

## 2023-11-08 MED ORDER — MEPERIDINE HCL 50 MG/ML IJ SOLN: 6.2500 mg | INTRAMUSCULAR | Status: DC | PRN

## 2023-11-08 MED ORDER — ACETAMINOPHEN 325 MG PO TABS: 325.0000 mg | ORAL_TABLET | Freq: Four times a day (QID) | ORAL | Status: DC | PRN

## 2023-11-08 MED ORDER — EPHEDRINE 5 MG/ML INJ
INTRAVENOUS | Status: AC
Start: 2023-11-08 — End: ?
  Filled 2023-11-08: qty 5

## 2023-11-08 MED ORDER — PROPOFOL 500 MG/50ML IV EMUL
INTRAVENOUS | Status: DC | PRN
  Administered 2023-11-08: 50 ug/kg/min via INTRAVENOUS

## 2023-11-08 MED ORDER — ONDANSETRON HCL 4 MG PO TABS: 4.0000 mg | ORAL_TABLET | Freq: Four times a day (QID) | ORAL | Status: DC | PRN

## 2023-11-08 MED ORDER — CEFAZOLIN SODIUM-DEXTROSE 2-4 GM/100ML-% IV SOLN
INTRAVENOUS | Status: AC
  Filled 2023-11-08: qty 100

## 2023-11-08 MED ORDER — POVIDONE-IODINE 7.5 % EX SOLN: Freq: Once | CUTANEOUS | Status: DC

## 2023-11-08 MED ORDER — MAGNESIUM CITRATE PO SOLN: 1.0000 | Freq: Once | ORAL | Status: DC | PRN

## 2023-11-08 MED ORDER — FENTANYL CITRATE PF 50 MCG/ML IJ SOSY
25.0000 ug | PREFILLED_SYRINGE | INTRAMUSCULAR | Status: DC | PRN
  Administered 2023-11-08 (×2): 50 ug via INTRAVENOUS

## 2023-11-08 MED ORDER — ONDANSETRON HCL 4 MG/2ML IJ SOLN: 4.0000 mg | Freq: Once | INTRAMUSCULAR | Status: DC | PRN

## 2023-11-08 MED ORDER — DOCUSATE SODIUM 100 MG PO CAPS
100.0000 mg | ORAL_CAPSULE | Freq: Two times a day (BID) | ORAL | Status: DC
  Administered 2023-11-08 – 2023-11-12 (×8): 100 mg via ORAL
  Filled 2023-11-08 (×8): qty 1

## 2023-11-08 MED ORDER — KETOROLAC TROMETHAMINE 30 MG/ML IJ SOLN
INTRAMUSCULAR | Status: AC
  Filled 2023-11-08: qty 1

## 2023-11-08 MED ORDER — SODIUM CHLORIDE 0.9 % IR SOLN
Status: DC | PRN
Start: 2023-11-08 — End: 2023-11-08
  Administered 2023-11-08: 1000 mL

## 2023-11-08 MED ORDER — ONDANSETRON HCL 4 MG/2ML IJ SOLN
INTRAMUSCULAR | Status: AC
Start: 2023-11-08 — End: ?
  Filled 2023-11-08: qty 2

## 2023-11-08 MED ORDER — MENTHOL 3 MG MT LOZG: 1.0000 | LOZENGE | OROMUCOSAL | Status: DC | PRN

## 2023-11-08 MED ORDER — TRANEXAMIC ACID-NACL 1000-0.7 MG/100ML-% IV SOLN
1000.0000 mg | Freq: Once | INTRAVENOUS | Status: AC
  Administered 2023-11-08: 1000 mg via INTRAVENOUS

## 2023-11-08 MED ORDER — PHENYLEPHRINE HCL-NACL 20-0.9 MG/250ML-% IV SOLN
INTRAVENOUS | Status: DC | PRN
Start: 2023-11-08 — End: 2023-11-08
  Administered 2023-11-08: 50 ug/min via INTRAVENOUS

## 2023-11-08 MED ORDER — LACTATED RINGERS IV SOLN: INTRAVENOUS | Status: DC

## 2023-11-08 MED ORDER — FLUOXETINE HCL 20 MG PO CAPS
20.0000 mg | ORAL_CAPSULE | Freq: Every day | ORAL | Status: DC
  Administered 2023-11-09 – 2023-11-12 (×4): 20 mg via ORAL
  Filled 2023-11-08 (×4): qty 1

## 2023-11-08 MED ORDER — FENTANYL CITRATE PF 50 MCG/ML IJ SOSY
PREFILLED_SYRINGE | INTRAMUSCULAR | Status: AC
  Filled 2023-11-08: qty 1

## 2023-11-08 MED ORDER — OXYCODONE HCL 5 MG PO TABS: 5.0000 mg | ORAL_TABLET | Freq: Once | ORAL | Status: DC | PRN

## 2023-11-08 MED ORDER — FENTANYL CITRATE (PF) 100 MCG/2ML IJ SOLN
INTRAMUSCULAR | Status: DC | PRN
Start: 2023-11-08 — End: 2023-11-08
  Administered 2023-11-08 (×2): 50 ug via INTRAVENOUS

## 2023-11-08 MED ORDER — OXYCODONE HCL 5 MG PO TABS
10.0000 mg | ORAL_TABLET | ORAL | Status: DC | PRN
  Administered 2023-11-09: 10 mg via ORAL
  Filled 2023-11-08: qty 3

## 2023-11-08 MED ORDER — POLYVINYL ALCOHOL 1.4 % OP SOLN: 1.0000 [drp] | Freq: Three times a day (TID) | OPHTHALMIC | Status: DC | PRN

## 2023-11-08 MED ORDER — DEXAMETHASONE SODIUM PHOSPHATE 10 MG/ML IJ SOLN
10.0000 mg | Freq: Once | INTRAMUSCULAR | Status: AC
  Administered 2023-11-09: 10 mg via INTRAVENOUS
  Filled 2023-11-08: qty 1

## 2023-11-08 MED ORDER — BACLOFEN 10 MG PO TABS: 10.0000 mg | ORAL_TABLET | Freq: Three times a day (TID) | ORAL | 0 refills | Status: DC

## 2023-11-08 MED ORDER — PROPOFOL 1000 MG/100ML IV EMUL
INTRAVENOUS | Status: AC
  Filled 2023-11-08: qty 100

## 2023-11-08 MED ORDER — BUPIVACAINE IN DEXTROSE 0.75-8.25 % IT SOLN
INTRATHECAL | Status: DC | PRN
  Administered 2023-11-08: 1.8 mL via INTRATHECAL

## 2023-11-08 MED ORDER — ACETAMINOPHEN 500 MG PO TABS
1000.0000 mg | ORAL_TABLET | Freq: Once | ORAL | Status: AC
  Administered 2023-11-08: 1000 mg via ORAL

## 2023-11-08 MED ORDER — LIDOCAINE HCL (PF) 2 % IJ SOLN
INTRAMUSCULAR | Status: AC
  Filled 2023-11-08: qty 5

## 2023-11-08 MED ORDER — TRANEXAMIC ACID-NACL 1000-0.7 MG/100ML-% IV SOLN
INTRAVENOUS | Status: AC
  Filled 2023-11-08: qty 100

## 2023-11-08 MED ORDER — POLYETHYL GLYCOL-PROPYL GLYCOL 0.4-0.3 % OP SOLN: 1.0000 [drp] | Freq: Three times a day (TID) | OPHTHALMIC | Status: DC | PRN

## 2023-11-08 MED ORDER — CEFAZOLIN SODIUM-DEXTROSE 2-4 GM/100ML-% IV SOLN
2.0000 g | INTRAVENOUS | Status: AC
  Administered 2023-11-08: 2 g via INTRAVENOUS
  Filled 2023-11-08: qty 100

## 2023-11-08 MED ORDER — GABAPENTIN 300 MG PO CAPS
600.0000 mg | ORAL_CAPSULE | Freq: Three times a day (TID) | ORAL | Status: DC
Start: 2023-11-08 — End: 2023-11-12
  Administered 2023-11-08 – 2023-11-12 (×11): 600 mg via ORAL
  Filled 2023-11-08 (×11): qty 2

## 2023-11-08 MED ORDER — METHOCARBAMOL 1000 MG/10ML IJ SOLN: 500.0000 mg | Freq: Four times a day (QID) | INTRAMUSCULAR | Status: DC | PRN

## 2023-11-08 MED ORDER — DIPHENHYDRAMINE HCL 12.5 MG/5ML PO ELIX: 12.5000 mg | ORAL_SOLUTION | ORAL | Status: DC | PRN

## 2023-11-08 MED ORDER — ROPIVACAINE HCL 5 MG/ML IJ SOLN
INTRAMUSCULAR | Status: DC | PRN
  Administered 2023-11-08: 20 mL via PERINEURAL

## 2023-11-08 MED ORDER — KETOROLAC TROMETHAMINE 30 MG/ML IJ SOLN
INTRAMUSCULAR | Status: DC | PRN
  Administered 2023-11-08: 31 mL via INTRAMUSCULAR

## 2023-11-08 MED ORDER — OXYCODONE HCL 5 MG/5ML PO SOLN: 5.0000 mg | Freq: Once | ORAL | Status: DC | PRN

## 2023-11-08 MED ORDER — POLYETHYLENE GLYCOL 3350 17 G PO PACK: 17.0000 g | PACK | Freq: Every day | ORAL | Status: DC | PRN

## 2023-11-08 MED ORDER — ORAL CARE MOUTH RINSE: 15.0000 mL | Freq: Once | OROMUCOSAL | Status: AC

## 2023-11-08 MED ORDER — ACETAMINOPHEN 500 MG PO TABS
ORAL_TABLET | ORAL | Status: AC
  Filled 2023-11-08: qty 2

## 2023-11-08 MED ORDER — BUPROPION HCL ER (XL) 300 MG PO TB24
300.0000 mg | ORAL_TABLET | Freq: Every morning | ORAL | Status: DC
Start: 2023-11-09 — End: 2023-11-12
  Administered 2023-11-09 – 2023-11-12 (×4): 300 mg via ORAL
  Filled 2023-11-08 (×4): qty 1

## 2023-11-08 MED ORDER — POVIDONE-IODINE 10 % EX SWAB: 2.0000 | Freq: Once | CUTANEOUS | Status: DC

## 2023-11-08 MED ORDER — CEFAZOLIN SODIUM-DEXTROSE 2-4 GM/100ML-% IV SOLN
2.0000 g | Freq: Four times a day (QID) | INTRAVENOUS | Status: AC
  Administered 2023-11-08 – 2023-11-09 (×2): 2 g via INTRAVENOUS
  Filled 2023-11-08: qty 100

## 2023-11-08 MED ORDER — EPHEDRINE SULFATE-NACL 50-0.9 MG/10ML-% IV SOSY
PREFILLED_SYRINGE | INTRAVENOUS | Status: DC | PRN
  Administered 2023-11-08 (×3): 10 mg via INTRAVENOUS

## 2023-11-08 MED ORDER — ALUM & MAG HYDROXIDE-SIMETH 200-200-20 MG/5ML PO SUSP: 30.0000 mL | ORAL | Status: DC | PRN

## 2023-11-08 MED ORDER — DEXAMETHASONE SODIUM PHOSPHATE 10 MG/ML IJ SOLN
INTRAMUSCULAR | Status: DC | PRN
Start: 2023-11-08 — End: 2023-11-08
  Administered 2023-11-08: 8 mg via INTRAVENOUS

## 2023-11-08 MED ORDER — METHOCARBAMOL 500 MG PO TABS
500.0000 mg | ORAL_TABLET | Freq: Four times a day (QID) | ORAL | Status: DC | PRN
  Administered 2023-11-08 – 2023-11-10 (×3): 500 mg via ORAL
  Filled 2023-11-08 (×3): qty 1

## 2023-11-08 MED ORDER — SENNA-DOCUSATE SODIUM 8.6-50 MG PO TABS: 2.0000 | ORAL_TABLET | Freq: Every day | ORAL | 1 refills | Status: DC

## 2023-11-08 MED ORDER — BISACODYL 10 MG RE SUPP: 10.0000 mg | Freq: Every day | RECTAL | Status: DC | PRN

## 2023-11-08 MED ORDER — OXYCODONE HCL 5 MG PO TABS
5.0000 mg | ORAL_TABLET | ORAL | Status: DC | PRN
  Administered 2023-11-08: 5 mg via ORAL
  Administered 2023-11-09 – 2023-11-10 (×5): 10 mg via ORAL
  Filled 2023-11-08 (×4): qty 2
  Filled 2023-11-08: qty 1
  Filled 2023-11-08: qty 2

## 2023-11-08 MED ORDER — OXYCODONE HCL 5 MG PO TABS: 5.0000 mg | ORAL_TABLET | ORAL | 0 refills | Status: DC | PRN

## 2023-11-08 MED ORDER — ACETAMINOPHEN 500 MG PO TABS
1000.0000 mg | ORAL_TABLET | Freq: Once | ORAL | Status: DC
  Filled 2023-11-08: qty 2

## 2023-11-08 MED ORDER — ZOLPIDEM TARTRATE 5 MG PO TABS: 5.0000 mg | ORAL_TABLET | Freq: Every evening | ORAL | Status: DC | PRN

## 2023-11-08 MED ORDER — POVIDONE-IODINE 10 % EX SWAB
2.0000 | Freq: Once | CUTANEOUS | Status: DC
Start: 2023-11-08 — End: 2023-11-08

## 2023-11-08 MED ORDER — WATER FOR IRRIGATION, STERILE IR SOLN
Status: DC | PRN
  Administered 2023-11-08: 1000 mL

## 2023-11-08 MED ORDER — POTASSIUM CHLORIDE IN NACL 20-0.9 MEQ/L-% IV SOLN
INTRAVENOUS | Status: DC
  Filled 2023-11-08: qty 1000

## 2023-11-08 MED ORDER — HYDROMORPHONE HCL 1 MG/ML IJ SOLN
0.5000 mg | INTRAMUSCULAR | Status: DC | PRN
  Administered 2023-11-09: 0.5 mg via INTRAVENOUS
  Filled 2023-11-08: qty 1

## 2023-11-08 MED ORDER — PHENYLEPHRINE 80 MCG/ML (10ML) SYRINGE FOR IV PUSH (FOR BLOOD PRESSURE SUPPORT)
PREFILLED_SYRINGE | INTRAVENOUS | Status: DC | PRN
Start: 2023-11-08 — End: 2023-11-08
  Administered 2023-11-08: 80 ug via INTRAVENOUS

## 2023-11-08 MED ORDER — AMLODIPINE BESYLATE 10 MG PO TABS
10.0000 mg | ORAL_TABLET | Freq: Every day | ORAL | Status: DC
  Administered 2023-11-10 – 2023-11-12 (×2): 10 mg via ORAL
  Filled 2023-11-08 (×3): qty 1

## 2023-11-08 MED ORDER — BUPIVACAINE HCL (PF) 0.25 % IJ SOLN
INTRAMUSCULAR | Status: AC
  Filled 2023-11-08: qty 30

## 2023-11-08 MED ORDER — METOCLOPRAMIDE HCL 5 MG/ML IJ SOLN: 5.0000 mg | Freq: Three times a day (TID) | INTRAMUSCULAR | Status: DC | PRN

## 2023-11-08 MED ORDER — ONDANSETRON HCL 4 MG PO TABS: 4.0000 mg | ORAL_TABLET | Freq: Three times a day (TID) | ORAL | 0 refills | Status: DC | PRN

## 2023-11-08 MED ORDER — DEXAMETHASONE SODIUM PHOSPHATE 10 MG/ML IJ SOLN
INTRAMUSCULAR | Status: AC
  Filled 2023-11-08: qty 1

## 2023-11-08 MED ORDER — OXYBUTYNIN CHLORIDE ER 10 MG PO TB24
10.0000 mg | ORAL_TABLET | Freq: Every day | ORAL | Status: DC
  Administered 2023-11-08 – 2023-11-11 (×4): 10 mg via ORAL
  Filled 2023-11-08 (×4): qty 1

## 2023-11-08 MED ORDER — LOSARTAN POTASSIUM 50 MG PO TABS
100.0000 mg | ORAL_TABLET | Freq: Every day | ORAL | Status: DC
  Administered 2023-11-10 – 2023-11-12 (×2): 100 mg via ORAL
  Filled 2023-11-08 (×3): qty 2

## 2023-11-08 MED ORDER — FENTANYL CITRATE PF 50 MCG/ML IJ SOSY
50.0000 ug | PREFILLED_SYRINGE | INTRAMUSCULAR | Status: DC
  Administered 2023-11-08 (×2): 50 ug via INTRAVENOUS
  Filled 2023-11-08: qty 2

## 2023-11-08 MED ORDER — TRANEXAMIC ACID-NACL 1000-0.7 MG/100ML-% IV SOLN
1000.0000 mg | INTRAVENOUS | Status: AC
  Administered 2023-11-08: 1000 mg via INTRAVENOUS
  Filled 2023-11-08: qty 100

## 2023-11-08 MED ORDER — PROPOFOL 10 MG/ML IV BOLUS
INTRAVENOUS | Status: AC
  Filled 2023-11-08: qty 20

## 2023-11-08 MED ORDER — ASPIRIN 325 MG PO TBEC
325.0000 mg | DELAYED_RELEASE_TABLET | Freq: Two times a day (BID) | ORAL | Status: DC
  Administered 2023-11-09 – 2023-11-12 (×7): 325 mg via ORAL
  Filled 2023-11-08 (×7): qty 1

## 2023-11-08 MED ORDER — ACETAMINOPHEN 500 MG PO TABS
1000.0000 mg | ORAL_TABLET | Freq: Four times a day (QID) | ORAL | Status: AC
  Administered 2023-11-08 – 2023-11-09 (×4): 1000 mg via ORAL
  Filled 2023-11-08 (×3): qty 2

## 2023-11-08 MED ORDER — ASPIRIN 325 MG PO TBEC: 325.0000 mg | DELAYED_RELEASE_TABLET | Freq: Two times a day (BID) | ORAL | 0 refills | Status: DC

## 2023-11-08 MED ORDER — PHENYLEPHRINE HCL (PRESSORS) 10 MG/ML IV SOLN
INTRAVENOUS | Status: DC | PRN
  Administered 2023-11-08: 100 ug via INTRAVENOUS

## 2023-11-08 MED ORDER — PHENOL 1.4 % MT LIQD: 1.0000 | OROMUCOSAL | Status: DC | PRN

## 2023-11-08 MED ORDER — ONDANSETRON HCL 4 MG/2ML IJ SOLN
INTRAMUSCULAR | Status: DC | PRN
Start: 2023-11-08 — End: 2023-11-08
  Administered 2023-11-08: 4 mg via INTRAVENOUS

## 2023-11-08 MED ORDER — 0.9 % SODIUM CHLORIDE (POUR BTL) OPTIME
TOPICAL | Status: DC | PRN
  Administered 2023-11-08: 1000 mL

## 2023-11-08 MED ORDER — CHLORHEXIDINE GLUCONATE 0.12 % MT SOLN
15.0000 mL | Freq: Once | OROMUCOSAL | Status: AC
  Administered 2023-11-08: 15 mL via OROMUCOSAL

## 2023-11-08 MED ORDER — FENTANYL CITRATE (PF) 100 MCG/2ML IJ SOLN
INTRAMUSCULAR | Status: AC
  Filled 2023-11-08: qty 2

## 2023-11-08 SURGICAL SUPPLY — 49 items
ATTUNE PS FEM LT SZ 4 CEM KNEE (Femur) IMPLANT
BAG COUNTER SPONGE SURGICOUNT (BAG) IMPLANT
BAG ZIPLOCK 12X15 (MISCELLANEOUS) IMPLANT
BASEPLATE TIB CMT FB PCKT SZ4 (Stem) IMPLANT
BLADE SAG 18X100X1.27 (BLADE) ×2 IMPLANT
BLADE SAW SGTL 11.0X1.19X90.0M (BLADE) IMPLANT
BLADE SAW SGTL 13X75X1.27 (BLADE) ×2 IMPLANT
BLADE SURG 15 STRL LF DISP TIS (BLADE) ×2 IMPLANT
BNDG ELASTIC 6X10 VLCR STRL LF (GAUZE/BANDAGES/DRESSINGS) ×2 IMPLANT
BOWL SMART MIX CTS (DISPOSABLE) ×2 IMPLANT
CEMENT HV SMART SET (Cement) ×4 IMPLANT
CLSR STERI-STRIP ANTIMIC 1/2X4 (GAUZE/BANDAGES/DRESSINGS) ×4 IMPLANT
COVER SURGICAL LIGHT HANDLE (MISCELLANEOUS) ×2 IMPLANT
CUFF TRNQT CYL 34X4.125X (TOURNIQUET CUFF) ×2 IMPLANT
DRAPE SHEET LG 3/4 BI-LAMINATE (DRAPES) ×2 IMPLANT
DRAPE U-SHAPE 47X51 STRL (DRAPES) ×2 IMPLANT
DRSG MEPILEX POST OP 4X12 (GAUZE/BANDAGES/DRESSINGS) ×2 IMPLANT
DURAPREP 26ML APPLICATOR (WOUND CARE) ×4 IMPLANT
ELECT PENCIL ROCKER SW 15FT (MISCELLANEOUS) ×2 IMPLANT
ELECT REM PT RETURN 15FT ADLT (MISCELLANEOUS) ×2 IMPLANT
GAUZE PAD ABD 8X10 STRL (GAUZE/BANDAGES/DRESSINGS) ×4 IMPLANT
GLOVE BIO SURGEON STRL SZ 6.5 (GLOVE) ×2 IMPLANT
GLOVE BIO SURGEON STRL SZ7.5 (GLOVE) ×2 IMPLANT
GLOVE BIOGEL PI IND STRL 7.0 (GLOVE) ×2 IMPLANT
GLOVE BIOGEL PI IND STRL 8 (GLOVE) ×2 IMPLANT
GOWN STRL SURGICAL XL XLNG (GOWN DISPOSABLE) ×4 IMPLANT
HOLDER FOLEY CATH W/STRAP (MISCELLANEOUS) IMPLANT
IMMOBILIZER KNEE 20 (SOFTGOODS) ×1 IMPLANT
IMMOBILIZER KNEE 20 THIGH 36 (SOFTGOODS) ×2 IMPLANT
INSERT TIB ATTUNE FB SZ4X6 (Insert) IMPLANT
KIT TURNOVER KIT A (KITS) IMPLANT
MANIFOLD NEPTUNE II (INSTRUMENTS) ×2 IMPLANT
NS IRRIG 1000ML POUR BTL (IV SOLUTION) ×2 IMPLANT
PACK TOTAL KNEE CUSTOM (KITS) ×2 IMPLANT
PATELLA MEDIAL ATTUN 35MM KNEE (Knees) IMPLANT
PIN STEINMAN FIXATION KNEE (PIN) IMPLANT
PROTECTOR NERVE ULNAR (MISCELLANEOUS) ×2 IMPLANT
SET HNDPC FAN SPRY TIP SCT (DISPOSABLE) ×2 IMPLANT
SET PAD KNEE POSITIONER (MISCELLANEOUS) ×2 IMPLANT
SPIKE FLUID TRANSFER (MISCELLANEOUS) IMPLANT
SUT STRATAFIX PDS+ 0 24IN (SUTURE) ×2 IMPLANT
SUT VIC AB 0 CT1 36 (SUTURE) ×2 IMPLANT
SUT VIC AB 1 CT1 36 (SUTURE) ×4 IMPLANT
SUT VIC AB 2-0 CT1 TAPERPNT 27 (SUTURE) ×2 IMPLANT
SUT VIC AB 3-0 SH 27X BRD (SUTURE) IMPLANT
TRAY FOLEY MTR SLVR 16FR STAT (SET/KITS/TRAYS/PACK) ×2 IMPLANT
TUBE SUCTION HIGH CAP CLEAR NV (SUCTIONS) ×2 IMPLANT
WATER STERILE IRR 1000ML POUR (IV SOLUTION) ×4 IMPLANT
WRAP KNEE MAXI GEL POST OP (GAUZE/BANDAGES/DRESSINGS) ×2 IMPLANT

## 2023-11-08 NOTE — Anesthesia Procedure Notes (Signed)
 Spinal  Patient location during procedure: OR Start time: 11/08/2023 2:20 PM End time: 11/08/2023 2:25 PM Reason for block: surgical anesthesia Staffing Anesthesiologist: Bethena Midget, MD Performed by: Bethena Midget, MD Authorized by: Bethena Midget, MD   Preanesthetic Checklist Completed: patient identified, IV checked, site marked, risks and benefits discussed, surgical consent, monitors and equipment checked, pre-op evaluation and timeout performed Spinal Block Patient position: sitting Prep: DuraPrep Patient monitoring: heart rate, cardiac monitor, continuous pulse ox and blood pressure Approach: midline Location: L2-3 Injection technique: single-shot Needle Needle type: Sprotte  Needle gauge: 24 G Needle length: 9 cm Assessment Sensory level: T4 Events: CSF return

## 2023-11-08 NOTE — Discharge Instructions (Signed)

## 2023-11-08 NOTE — Op Note (Signed)
 DATE OF SURGERY:  11/08/2023 TIME: 4:33 PM  PATIENT NAME:  Maria Wang   AGE: 73 y.o.    PRE-OPERATIVE DIAGNOSIS: Left knee primary localized osteoarthritis  POST-OPERATIVE DIAGNOSIS:  Same  PROCEDURE: Left total Knee Arthroplasty  SURGEON:  Eulas Post, MD   ASSISTANT:  Janine Ores, PA-C, present and scrubbed throughout the case, critical for assistance with exposure, retraction, instrumentation, and closure.  OPERATIVE IMPLANTS: Implant Name: CEMENT HV SMART SET - [id number] Type: Cement Inv. Item: CEMENT HV SMART SET Serial No.:  Manufacturer: [employer] Lot No.: [id number] LRB: Left No. Used: 2 Action: Implanted   Implant Name: BASEPLATE TIB CMT FB PCKT SZ4 - [id number] Type: Stem Inv. Item: BASEPLATE TIB CMT FB PCKT SZ4 Serial No.:  Manufacturer: [employer] Lot No.: [id number] LRB: Left No. Used: 1 Action: Implanted   Implant Name: ATTUNE PS FEM LT SZ 4 CEM KNEE - [id number] Type: Femur Inv. Item: ATTUNE PS FEM LT SZ 4 CEM KNEE Serial No.:  Manufacturer: [employer] Lot No.: [id number] LRB: Left No. Used: 1 Action: Implanted   Implant Name: PATELLA MEDIAL ATTUN KNEE - [id number] Type: Knees Inv. Item: PATELLA MEDIAL ATTUN KNEE Serial No.:  Manufacturer: [employer] Lot No.: [id number] LRB: Left No. Used: 1 Action: Implanted   Implant Name: INSERT TIB ATTUNE FB [id number] - [id number] Type: Insert Inv. Item: INSERT TIB ATTUNE FB [id number] Serial No.:  Manufacturer: [employer] Lot No.:  LRB: Left No. Used: 1 Action: Implanted   PREOPERATIVE INDICATIONS:  Maria Wang is a 73 y.o. year old female with end stage bone on bone degenerative arthritis of the knee who failed conservative treatment, including injections, antiinflammatories, activity modification, and assistive devices, and had significant impairment of their activities of daily living, and elected for Total Knee Arthroplasty.   The  risks, benefits, and alternatives were discussed at length including but not limited to the risks of infection, bleeding, nerve injury, stiffness, blood clots, the need for revision surgery, cardiopulmonary complications, among others, and they were willing to proceed.  OPERATIVE FINDINGS AND UNIQUE ASPECTS OF THE CASE: She had a fairly extreme amount of adipose contained around her legs, I could not feel the landmarks much at all.  I did have excellent access once I mobilized the tibia, she had grade 4 chondral changes on the lateral patellar facet as well as the lateral femur and tibia.  My first 2 cuts on both the femur and the tibia were fairly thin, and I ended up cutting both twice.  The patella tracking was fairly challenging, and it was not until I had closed the capsule that I was satisfied that she had adequate patellar tracking.  Also, she had a tendency to be a little bit more loose in flexion than in extension, I ended up using a size 6 polyethylene insert, the lateral side was a little bit more lax than the medial side.  The tibial baseplate rotation was challenging to assess, in part because of her body habitus, but I also think that she had some somewhat abnormal anatomy to begin with.  When I aligned the baseplate with the tubercle it was extremely externally rotated compared to the tibial crest and compared to the second metatarsal, I ended up aligning it a little bit more medial, which had me concerned about the patellar tracking, but ultimately I think tracking was appropriate.  ESTIMATED BLOOD LOSS: 200 mL  OPERATIVE DESCRIPTION:  The patient was brought to the operative room  and placed in a supine position.  Anesthesia was administered.  IV antibiotics were given.  The lower extremity was prepped and draped in the usual sterile fashion.  Time out was performed.  The leg was elevated and exsanguinated and the tourniquet was inflated.  Anterior quadriceps tendon splitting approach was  performed.  The patella was everted and osteophytes were removed.  The anterior horn of the medial and lateral meniscus was removed.   The patella was then measured, and cut with the saw.  The thickness before the cut was 20 and after the cut was 13.  A metal shield was used to protect the patella throughout the case.    The distal femur was opened with the drill and the intramedullary distal femoral cutting jig was utilized, set at 5 degrees resecting 9 mm off the distal femur.  Care was taken to protect the collateral ligaments.  Then the extramedullary tibial cutting jig was utilized making the appropriate cut using the anterior tibial crest as a reference building in appropriate posterior slope.  Care was taken during the cut to protect the medial and collateral ligaments.  The proximal tibia was removed along with the posterior horns of the menisci.  The PCL was sacrificed.    The extensor gap was measured and found to have adequate resection, measuring to a size 5.  I did have to cut the femur and the tibia a second time.  The distal femoral sizing jig was applied, taking care to avoid notching.  This was set at 3 degrees of external rotation.  Then the 4-in-1 cutting jig was applied and the anterior and posterior femur was cut, along with the chamfer cuts.  All posterior osteophytes were removed.  The flexion gap was then measured and was symmetric with the extension gap.  I completed the distal femoral preparation using the appropriate jig to prepare the box.  The proximal tibia sized and prepared accordingly with the reamer and the punch, and then all components were trialed with the poly insert.  The knee was found to have excellent balance and full motion.    The above named components were then cemented into place and all excess cement was removed.  The real polyethylene implant was placed.  After the cement had cured I released the tourniquet and confirmed excellent hemostasis with no  major posterior vessel injury.    The knee was easily taken through a range of motion and the patella tracked well and the knee irrigated copiously and the parapatellar tissue closed with Stratafix and vicryl, and subcutaneous tissue closed with vicryl, and monocryl with steri strips for the skin.  The wounds were injected with marcaine, and dressed with sterile gauze and the patient was awakened and returned to the PACU in stable and satisfactory condition.  There were no complications.  Total tourniquet time was about 75 minutes.

## 2023-11-08 NOTE — Anesthesia Procedure Notes (Signed)
 Anesthesia Regional Block: Adductor canal block   Pre-Anesthetic Checklist: , timeout performed,  Correct Patient, Correct Site, Correct Laterality,  Correct Procedure, Correct Position, site marked,  Risks and benefits discussed,  Surgical consent,  Pre-op evaluation,  At surgeon's request and post-op pain management  Laterality: Left  Prep: chloraprep       Needles:  Injection technique: Single-shot  Needle Type: Echogenic Stimulator Needle     Needle Length: 5cm  Needle Gauge: 22     Additional Needles:   Procedures:,,,, ultrasound used (permanent image in chart),,    Narrative:  Start time: 11/08/2023 1:35 PM End time: 11/08/2023 1:40 PM Injection made incrementally with aspirations every 5 mL.  Performed by: Personally  Anesthesiologist: Bethena Midget, MD  Additional Notes: Functioning IV was confirmed and monitors were applied.  A 50mm 22ga Arrow echogenic stimulator needle was used. Sterile prep and drape,hand hygiene and sterile gloves were used. Ultrasound guidance: relevant anatomy identified, needle position confirmed, local anesthetic spread visualized around nerve(s)., vascular puncture avoided.  Image printed for medical record. Negative aspiration and negative test dose prior to incremental administration of local anesthetic. The patient tolerated the procedure well.

## 2023-11-08 NOTE — Anesthesia Postprocedure Evaluation (Signed)
 Anesthesia Post Note  Patient: Maria Wang  Procedure(s) Performed: ARTHROPLASTY, KNEE, TOTAL (Left: Knee)     Patient location during evaluation: PACU Anesthesia Type: Regional, MAC and Spinal Level of consciousness: awake and alert and oriented Pain management: pain level controlled Vital Signs Assessment: post-procedure vital signs reviewed and stable Respiratory status: spontaneous breathing, nonlabored ventilation and respiratory function stable Cardiovascular status: blood pressure returned to baseline and stable Postop Assessment: no headache, no backache and spinal receding Anesthetic complications: no   No notable events documented.  Last Vitals:  Vitals:   11/08/23 1730 11/08/23 1745  BP: (!) 116/50 133/63  Pulse: 65 64  Resp: 18 13  Temp:    SpO2: 95% 96%    Last Pain:  Vitals:   11/08/23 1745  TempSrc:   PainSc: 0-No pain                 Lannie Fields

## 2023-11-08 NOTE — Transfer of Care (Signed)
 Immediate Anesthesia Transfer of Care Note  Patient: Maria Wang  Procedure(s) Performed: ARTHROPLASTY, KNEE, TOTAL (Left: Knee)  Patient Location: PACU  Anesthesia Type:Spinal  Level of Consciousness: awake, alert , and oriented  Airway & Oxygen Therapy: Patient Spontanous Breathing  Post-op Assessment: Report given to RN and Post -op Vital signs reviewed and stable  Post vital signs: Reviewed and stable  Last Vitals:  Vitals Value Taken Time  BP 106/55 11/08/23 1715  Temp 36.6 C 11/08/23 1715  Pulse 66 11/08/23 1721  Resp 18 11/08/23 1721  SpO2 93 % 11/08/23 1721  Vitals shown include unfiled device data.  Last Pain:  Vitals:   11/08/23 1714  TempSrc:   PainSc: 0-No pain         Complications: No notable events documented.

## 2023-11-08 NOTE — Interval H&P Note (Signed)
 History and Physical Interval Note:  11/08/2023 12:29 PM  Maria Wang  has presented today for surgery, with the diagnosis of left knee OA.  The various methods of treatment have been discussed with the patient and family. After consideration of risks, benefits and other options for treatment, the patient has consented to  Procedure(s): ARTHROPLASTY, KNEE, TOTAL (Left) as a surgical intervention.  The patient's history has been reviewed, patient examined, no change in status, stable for surgery.  I have reviewed the patient's chart and labs.  Questions were answered to the patient's satisfaction.     Eulas Post

## 2023-11-09 ENCOUNTER — Other Ambulatory Visit: Payer: Self-pay

## 2023-11-09 ENCOUNTER — Encounter (HOSPITAL_COMMUNITY): Payer: Self-pay | Admitting: Orthopedic Surgery

## 2023-11-09 LAB — CBC
HCT: 38.9 % (ref 36.0–46.0)
Hemoglobin: 12.3 g/dL (ref 12.0–15.0)
MCH: 31.5 pg (ref 26.0–34.0)
MCHC: 31.6 g/dL (ref 30.0–36.0)
MCV: 99.5 fL (ref 80.0–100.0)
Platelets: 200 10*3/uL (ref 150–400)
RBC: 3.91 MIL/uL (ref 3.87–5.11)
RDW: 14.3 % (ref 11.5–15.5)
WBC: 11.1 10*3/uL — ABNORMAL HIGH (ref 4.0–10.5)
nRBC: 0 % (ref 0.0–0.2)

## 2023-11-09 LAB — BASIC METABOLIC PANEL WITH GFR
Anion gap: 8 (ref 5–15)
BUN: 19 mg/dL (ref 8–23)
CO2: 21 mmol/L — ABNORMAL LOW (ref 22–32)
Calcium: 8.1 mg/dL — ABNORMAL LOW (ref 8.9–10.3)
Chloride: 108 mmol/L (ref 98–111)
Creatinine, Ser: 0.86 mg/dL (ref 0.44–1.00)
GFR, Estimated: >60 mL/min (ref >60)
Glucose, Bld: 170 mg/dL — ABNORMAL HIGH (ref 70–99)
Potassium: 4.4 mmol/L (ref 3.5–5.1)
Sodium: 137 mmol/L (ref 135–145)

## 2023-11-09 NOTE — Care Management Obs Status (Signed)
 MEDICARE OBSERVATION STATUS NOTIFICATION   Patient Details  Name: Maria Wang MRN: [id number] Date of Birth: [birth year]   Medicare Observation Status Notification Given:  Yes    Howell Rucks, RN 11/09/2023, 9:46 AM

## 2023-11-09 NOTE — Evaluation (Signed)
 Physical Therapy Evaluation Patient Details Name: Maria Wang MRN: [id number] DOB: 1951/04/08 Today's Date: 11/09/2023  History of Present Illness  Maria Wang is a 73 yo female s/p L TKA 11/08/23. PMH: HTN, breast cancer s/p R lumpectomy  Clinical Impression  Pt is s/p L TKA resulting in the deficits listed below (see PT Problem List). Pt reports from home with spouse, ind at baseline without AD, denies falls, has 1 step to enter the home and 3 steps to bedrooms/bathrooms. On eval, pt limited significantly by pain and also reports dizziness/lightheaded sensation with positional changes. Pt needing heavy max A to come to sit EOB, therapist fully supporting LLE to maintain knee extension, pt with heavy use of BUE on bedrail and elevated HOB. Pt tolerates knee flexion to ~30, unable to perform SLR or LAQ so KI donned. Pt needing mod A+2 to power up to stand, slow to rise, strong BUE support with minimal weightbearing on LLE. Pt tolerates 1 step forward and back with strong UE support, again reporting lightheaded sensation and returned to sitting with BP 105/59- RN notified. Assisted pt back to supine with +2 A and repositioned to comfort. Plan to progress mobility and complete stair training prior to d/c home with spouse support. Pt will benefit from acute skilled PT to increase their independence and safety with mobility to allow discharge.          If plan is discharge home, recommend the following: A lot of help with walking and/or transfers;A lot of help with bathing/dressing/bathroom;Assistance with cooking/housework;Assist for transportation;Help with stairs or ramp for entrance   Can travel by private vehicle        Equipment Recommendations None recommended by PT  Recommendations for Other Services       Functional Status Assessment Patient has had a recent decline in their functional status and demonstrates the ability to make significant improvements in function in a  reasonable and predictable amount of time.     Precautions / Restrictions Precautions Precautions: Fall Recall of Precautions/Restrictions: Intact Precaution/Restrictions Comments: monitor BP Required Braces or Orthoses: Knee Immobilizer - Left Knee Immobilizer - Left:  (utilized for OOB) Restrictions Weight Bearing Restrictions Per Provider Order: No LLE Weight Bearing Per Provider Order: Weight bearing as tolerated      Mobility  Bed Mobility Overal bed mobility: Needs Assistance Bed Mobility: Supine to Sit     Supine to sit: Max assist, HOB elevated, Used rails     General bed mobility comments: therapist fully managing LLE, strong UE use on bedrail with HOB elevated, max A to mobilize to EOB and scoot out to place feet on floor    Transfers Overall transfer level: Needs assistance Equipment used: Rolling walker (2 wheels) Transfers: Sit to/from Stand Sit to Stand: Mod assist, +2 physical assistance, +2 safety/equipment           General transfer comment: mod A+2 to power to stand from EOB, LLE extended in KI, minimal weightshift to LLE with strong UE support on RW, able to take 1 step forward and back, reports lightheaded and high pain limiting mobility so returned to sitting    Ambulation/Gait               General Gait Details: unable  Stairs            Wheelchair Mobility     Tilt Bed    Modified Rankin (Stroke Patients Only)       Balance Overall balance assessment: Needs assistance  Sitting-balance support: Feet supported, Single extremity supported Sitting balance-Leahy Scale: Fair     Standing balance support: Reliant on assistive device for balance, During functional activity, Bilateral upper extremity supported Standing balance-Leahy Scale: Poor                               Pertinent Vitals/Pain Pain Assessment Pain Assessment: 0-10 Pain Score: 9  Pain Location: L knee wiht movement Pain Descriptors /  Indicators: Grimacing, Guarding, Discomfort Pain Intervention(s): Limited activity within patient's tolerance, Monitored during session, Premedicated before session, Repositioned, Patient requesting pain meds-RN notified, Ice applied    Home Living Family/patient expects to be discharged to:: Private residence Living Arrangements: Spouse/significant other Available Help at Discharge: Family;Available 24 hours/day Type of Home: House Home Access: Stairs to enter Entrance Stairs-Rails: None Entrance Stairs-Number of Steps: 1 Alternate Level Stairs-Number of Steps: 3 Home Layout: Multi-level Home Equipment: Pharmacist, hospital (2 wheels);Rollator (4 wheels);BSC/3in1      Prior Function Prior Level of Function : Independent/Modified Independent             Mobility Comments: pt reports SPC intermittently, denies falls in last 6 months ADLs Comments: pt reports ind     Extremity/Trunk Assessment        Lower Extremity Assessment Lower Extremity Assessment: Generalized weakness;LLE deficits/detail LLE Deficits / Details: ankle AROM WFL, able to perform quad set, unable to perform SLR or LAQ, knee flexion ~30 deg with pain limiting    Cervical / Trunk Assessment Cervical / Trunk Assessment: Kyphotic  Communication   Communication Communication: No apparent difficulties    Cognition Arousal: Alert Behavior During Therapy: WFL for tasks assessed/performed   PT - Cognitive impairments: No apparent impairments                         Following commands: Intact       Cueing       General Comments General comments (skin integrity, edema, etc.): BP 126/65 sitting EOB, after standing and back in sitting BP 105/59; SpO2 >92% on RA and HR 60s - RN notified    Exercises Total Joint Exercises Ankle Circles/Pumps: AROM, Both, 10 reps, Supine Short Arc Quad: AAROM, Left, 5 reps Long Arc Quad: AAROM, Left, 5 reps   Assessment/Plan    PT Assessment Patient  needs continued PT services  PT Problem List Decreased strength;Decreased range of motion;Decreased activity tolerance;Decreased balance;Decreased mobility;Decreased knowledge of use of DME;Decreased safety awareness;Decreased knowledge of precautions;Obesity;Pain       PT Treatment Interventions DME instruction;Gait training;Stair training;Functional mobility training;Therapeutic activities;Therapeutic exercise;Balance training;Patient/family education;Modalities    PT Goals (Current goals can be found in the Care Plan section)  Acute Rehab PT Goals Patient Stated Goal: regain strength and independence PT Goal Formulation: With patient Time For Goal Achievement: 11/23/23 Potential to Achieve Goals: Good    Frequency 7X/week     Co-evaluation               AM-PAC PT "6 Clicks" Mobility  Outcome Measure Help needed turning from your back to your side while in a flat bed without using bedrails?: A Lot Help needed moving from lying on your back to sitting on the side of a flat bed without using bedrails?: A Lot Help needed moving to and from a bed to a chair (including a wheelchair)?: Total Help needed standing up from a chair using your arms (e.g., wheelchair or  bedside chair)?: Total Help needed to walk in hospital room?: Total Help needed climbing 3-5 steps with a railing? : Total 6 Click Score: 8    End of Session Equipment Utilized During Treatment: Gait belt Activity Tolerance: Patient limited by pain;Treatment limited secondary to medical complications (Comment) (lightheaded) Patient left: in bed;with call bell/phone within reach;with bed alarm set Nurse Communication: Mobility status;Other (comment) (pain, lighthedaed, BP) PT Visit Diagnosis: Muscle weakness (generalized) (M62.81);Difficulty in walking, not elsewhere classified (R26.2);Pain Pain - Right/Left: Left Pain - part of body: Knee    Time: 1610-9604 PT Time Calculation (min) (ACUTE ONLY): 45  min   Charges:   PT Evaluation $PT Eval Low Complexity: 1 Low PT Treatments $Therapeutic Exercise: 8-22 mins $Therapeutic Activity: 8-22 mins PT General Charges $$ ACUTE PT VISIT: 1 Visit         Tori Ledora Delker PT, DPT 11/09/23, 11:06 AM

## 2023-11-09 NOTE — Progress Notes (Signed)
 Physical Therapy Treatment Patient Details Name: Grier Czerwinski MRN: [id number] DOB: 10-Jun-1951 Today's Date: 11/09/2023   History of Present Illness Caliyah Sieh is a 73 yo female s/p L TKA 11/08/23. PMH: HTN, breast cancer s/p R lumpectomy    PT Comments  POD 1 pm session, pt reports 3/10 pain at rest in bed, agreeable to therapy. Pt comes to sitting EOB with min A for LLE management, able to self assist trunk with bedrail and HOB elevated; KI donned prior to mobilizing to EOB. Pt powers to stand with min A+2 for safety, no LE buckling noted, LLE slightly extended for comfort, min A for controlled descent back into sitting. Pt amb 3' with RW, then additional 5 ft with RW after seated rest break. Pt with step-to gait pattern, limited LLE stance time and weightbearing, no LE buckling noted, limited by pain. Pt tolerates seated exercises with some increased pain that resolves with seated rest breaks. Pt reports intermittent lightheadedness throughout session, cues for pursed lip breathing and avoiding breath holding. Plan to continue progressing mobility with plan to d/c home with spouse support and HHPT per MD recommendation.  Pt's spouse present this session, reports 1 step to enter the home and 2 steps to enter the bedroom/bathroom.   If plan is discharge home, recommend the following: A lot of help with walking and/or transfers;A lot of help with bathing/dressing/bathroom;Assistance with cooking/housework;Assist for transportation;Help with stairs or ramp for entrance   Can travel by private vehicle        Equipment Recommendations  None recommended by PT    Recommendations for Other Services       Precautions / Restrictions Precautions Precautions: Fall Recall of Precautions/Restrictions: Intact Precaution/Restrictions Comments: monitor BP Required Braces or Orthoses: Knee Immobilizer - Left Knee Immobilizer - Left:  (utilized for OOB) Restrictions Weight Bearing  Restrictions Per Provider Order: No LLE Weight Bearing Per Provider Order: Weight bearing as tolerated     Mobility  Bed Mobility Overal bed mobility: Needs Assistance Bed Mobility: Supine to Sit     Supine to sit: Min assist, HOB elevated, Used rails   General bed mobility comments: min A to manage LLE in KI, pt with strong BUE on bedrail and HOB elevatedm, increased time and effort to scoot to EOB    Transfers Overall transfer level: Needs assistance Equipment used: Rolling walker (2 wheels) Transfers: Sit to/from Stand Sit to Stand: Min assist, +2 safety/equipment           General transfer comment: min A +2 for safety to power to stand from EOB and recliner, LLE slightly extended with KI on for support    Ambulation/Gait Ambulation/Gait assistance: Min Chemical engineer (Feet):  (5', 3') Assistive device: Rolling walker (2 wheels) Gait Pattern/deviations: Step-to pattern, Decreased stance time - left, Decreased weight shift to left Gait velocity: decreased     General Gait Details: short step-to steps with limited LLE weightbearing, strong BUE support, KI on without buckling noted, limited by pain, close chair follow for safety, cues for breathing as pt appears to hold breath due to pain reports in weightbearing   Stairs             Wheelchair Mobility     Tilt Bed    Modified Rankin (Stroke Patients Only)       Balance Overall balance assessment: Needs assistance Sitting-balance support: Feet supported Sitting balance-Leahy Scale: Good     Standing balance support: Reliant on assistive device for balance, During  functional activity, Bilateral upper extremity supported Standing balance-Leahy Scale: Poor                              Communication Communication Communication: No apparent difficulties  Cognition Arousal: Alert Behavior During Therapy: WFL for tasks assessed/performed   PT - Cognitive impairments: No apparent  impairments                         Following commands: Intact      Cueing    Exercises Total Joint Exercises Ankle Circles/Pumps: AROM, Both, 10 reps, Seated Quad Sets: AROM, Left, 5 reps, Seated Short Arc Quad: AAROM, Left, 5 reps Long Arc Quad: AAROM, Left, 5 reps    General Comments General comments (skin integrity, edema, etc.): BP 118/43 after supine to sit, 120/68 after transfer to recliner and pt reports lightheadedness intermittently during session      Pertinent Vitals/Pain Pain Assessment Pain Assessment: 0-10 Pain Score: 7  Pain Location: L knee with movement Pain Descriptors / Indicators: Grimacing, Guarding, Discomfort Pain Intervention(s): Limited activity within patient's tolerance, Monitored during session, Premedicated before session, Repositioned, Ice applied    Home Living                          Prior Function            PT Goals (current goals can now be found in the care plan section) Acute Rehab PT Goals Patient Stated Goal: regain strength and independence PT Goal Formulation: With patient Time For Goal Achievement: 11/23/23 Potential to Achieve Goals: Good Progress towards PT goals: Progressing toward goals    Frequency    7X/week      PT Plan      Co-evaluation              AM-PAC PT "6 Clicks" Mobility   Outcome Measure  Help needed turning from your back to your side while in a flat bed without using bedrails?: A Little Help needed moving from lying on your back to sitting on the side of a flat bed without using bedrails?: A Little Help needed moving to and from a bed to a chair (including a wheelchair)?: Total Help needed standing up from a chair using your arms (e.g., wheelchair or bedside chair)?: Total Help needed to walk in hospital room?: A Lot Help needed climbing 3-5 steps with a railing? : Total 6 Click Score: 11    End of Session Equipment Utilized During Treatment: Gait belt Activity  Tolerance: Patient tolerated treatment well;Patient limited by pain;Treatment limited secondary to medical complications (Comment) (lightheaded) Patient left: in bed;with call bell/phone within reach;with bed alarm set Nurse Communication: Mobility status;Other (comment) (pain, lightheaded) PT Visit Diagnosis: Muscle weakness (generalized) (M62.81);Difficulty in walking, not elsewhere classified (R26.2);Pain Pain - Right/Left: Left Pain - part of body: Knee     Time: 2956-2130 PT Time Calculation (min) (ACUTE ONLY): 30 min  Charges:    $Gait Training: 8-22 mins $Therapeutic Exercise: 8-22 mins PT General Charges $$ ACUTE PT VISIT: 1 Visit                     Tori Emilianna Barlowe PT, DPT 11/09/23, 2:53 PM

## 2023-11-09 NOTE — TOC Transition Note (Signed)
 Transition of Care [facility]) - Discharge Note   Patient Details  Name: Maria Wang MRN: [id number] Date of Birth: 10-Feb-1951  [facility] CM/SW Contact:  Howell Rucks, RN Phone Number: 11/09/2023, 9:49 AM   Clinical Narrative:   Met with patient at bedside to review dc therapy and home equipment needs, pt confirmed HHPT with [facility], has RW, no home DME needs. No TOC needs.     Final next level of care: Home w Home Health Services Barriers to Discharge: No Barriers Identified   Patient Goals and CMS Choice Patient states their goals for this hospitalization and ongoing recovery are:: return home          Discharge Placement                       Discharge Plan and Services Additional resources added to the After Visit Summary for                            [facility] Arranged: PT HH Agency: [facility]        Social Drivers of Health (SDOH) Interventions SDOH Screenings   Food Insecurity: No Food Insecurity (11/08/2023)  Housing: Low Risk  (11/08/2023)  Transportation Needs: No Transportation Needs (11/08/2023)  Utilities: Not At Risk (11/08/2023)  Depression (PHQ2-9): Low Risk  (09/05/2023)  Financial Resource Strain: Low Risk  (01/06/2023)  Physical Activity: Inactive (01/06/2023)  Social Connections: Moderately Integrated (11/08/2023)  Stress: No Stress Concern Present (01/06/2023)  Tobacco Use: Medium Risk (11/08/2023)     Readmission Risk Interventions     No data to display

## 2023-11-09 NOTE — Plan of Care (Signed)
  Problem: Education: Goal: Knowledge of the prescribed therapeutic regimen will improve Outcome: Progressing   Problem: Activity: Goal: Range of joint motion will improve Outcome: Progressing   Problem: Coping: Goal: Level of anxiety will decrease Outcome: Progressing   Problem: Safety: Goal: Ability to remain free from injury will improve Outcome: Progressing

## 2023-11-09 NOTE — Progress Notes (Signed)
     Subjective: 1 Day Post-Op s/p Procedure(s): ARTHROPLASTY, KNEE, TOTAL   Patient is alert, oriented. Reports pain is moderate to severe.  Denies chest pain, SOB, Calf pain. No nausea/vomiting. No other complaints.    Objective:  PE: VITALS:   Vitals:   11/08/23 2215 11/08/23 2244 11/09/23 0141 11/09/23 0608  BP:  101/64 (!) 114/54 120/62  Pulse: 72 69 74 (!) 59  Resp: 14 17 16 16   Temp:  98.2 F (36.8 C) 98 F (36.7 C) 98.3 F (36.8 C)  TempSrc:  Oral Oral Oral  SpO2: 98% 97% 96% 97%  Weight:      Height:        Sensation intact distally Intact pulses distally Dorsiflexion/Plantar flexion intact Incision: dressing C/D/I  LABS  Results for orders placed or performed during the hospital encounter of 11/08/23 (from the past 24 hours)  CBC     Status: Abnormal   Collection Time: 11/09/23  3:24 AM  Result Value Ref Range   WBC 11.1 (H) 4.0 - 10.5 K/uL   RBC 3.91 3.87 - 5.11 MIL/uL   Hemoglobin 12.3 12.0 - 15.0 g/dL   HCT 95.6 21.3 - 08.6 %   MCV 99.5 80.0 - 100.0 fL   MCH 31.5 26.0 - 34.0 pg   MCHC 31.6 30.0 - 36.0 g/dL   RDW 57.8 46.9 - 62.9 %   Platelets 200 150 - 400 K/uL   nRBC 0.0 0.0 - 0.2 %  Basic metabolic panel     Status: Abnormal   Collection Time: 11/09/23  3:24 AM  Result Value Ref Range   Sodium 137 135 - 145 mmol/L   Potassium 4.4 3.5 - 5.1 mmol/L   Chloride 108 98 - 111 mmol/L   CO2 21 (L) 22 - 32 mmol/L   Glucose, Bld 170 (H) 70 - 99 mg/dL   BUN 19 8 - 23 mg/dL   Creatinine, Ser 5.28 0.44 - 1.00 mg/dL   Calcium 8.1 (L) 8.9 - 10.3 mg/dL   GFR, Estimated >41 >32 mL/min   Anion gap 8 5 - 15    DG Knee Left Port Result Date: 11/09/2023 CLINICAL DATA:  Status post knee replacement. EXAM: PORTABLE LEFT KNEE - 1-2 VIEW COMPARISON:  None Available. FINDINGS: Left knee arthroplasty in expected alignment. No periprosthetic lucency or fracture. There has been patellar resurfacing. Recent postsurgical change includes air and edema in the soft  tissues and joint space. IMPRESSION: Left knee arthroplasty without immediate postoperative complication. Electronically Signed   By: Narda Rutherford M.D.   On: 11/09/2023 00:08    Assessment/Plan: Principal Problem:   S/P TKR (total knee replacement), left  1 Day Post-Op s/p Procedure(s): ARTHROPLASTY, KNEE, TOTAL  Weightbearing: WBAT LLE, up with therapy Insicional and dressing care: Reinforce dressings as needed VTE prophylaxis: Aspirin 325mg  BID  x 30 days Pain control: continue current regimen Follow - up plan: 2 weeks with Dr. Dion Saucier Dispo: Hoping to discharge this afternoon, pending progress with PT today and improved pain control  Contact information:   Janine Ores, PA-C Weekdays 8-5  After hours and holidays please check [email or web address] for group call information for [facility]  Armida Sans 11/09/2023, 7:28 AM

## 2023-11-10 DIAGNOSIS — Z79899 Other long term (current) drug therapy: Secondary | ICD-10-CM | POA: Diagnosis not present

## 2023-11-10 DIAGNOSIS — I959 Hypotension, unspecified: Secondary | ICD-10-CM | POA: Diagnosis not present

## 2023-11-10 DIAGNOSIS — Z880 Allergy status to penicillin: Secondary | ICD-10-CM | POA: Diagnosis not present

## 2023-11-10 DIAGNOSIS — Z820 Family history of epilepsy and other diseases of the nervous system: Secondary | ICD-10-CM | POA: Diagnosis not present

## 2023-11-10 DIAGNOSIS — Z7983 Long term (current) use of bisphosphonates: Secondary | ICD-10-CM | POA: Diagnosis not present

## 2023-11-10 DIAGNOSIS — Z813 Family history of other psychoactive substance abuse and dependence: Secondary | ICD-10-CM | POA: Diagnosis not present

## 2023-11-10 DIAGNOSIS — Z887 Allergy status to serum and vaccine status: Secondary | ICD-10-CM | POA: Diagnosis not present

## 2023-11-10 DIAGNOSIS — Z811 Family history of alcohol abuse and dependence: Secondary | ICD-10-CM | POA: Diagnosis not present

## 2023-11-10 DIAGNOSIS — K219 Gastro-esophageal reflux disease without esophagitis: Secondary | ICD-10-CM | POA: Diagnosis not present

## 2023-11-10 DIAGNOSIS — I10 Essential (primary) hypertension: Secondary | ICD-10-CM | POA: Diagnosis not present

## 2023-11-10 DIAGNOSIS — Z96652 Presence of left artificial knee joint: Secondary | ICD-10-CM

## 2023-11-10 DIAGNOSIS — M1712 Unilateral primary osteoarthritis, left knee: Secondary | ICD-10-CM | POA: Diagnosis not present

## 2023-11-10 DIAGNOSIS — Z87891 Personal history of nicotine dependence: Secondary | ICD-10-CM | POA: Diagnosis not present

## 2023-11-10 DIAGNOSIS — Z8261 Family history of arthritis: Secondary | ICD-10-CM | POA: Diagnosis not present

## 2023-11-10 DIAGNOSIS — Z818 Family history of other mental and behavioral disorders: Secondary | ICD-10-CM | POA: Diagnosis not present

## 2023-11-10 DIAGNOSIS — Z833 Family history of diabetes mellitus: Secondary | ICD-10-CM | POA: Diagnosis not present

## 2023-11-10 DIAGNOSIS — Z9049 Acquired absence of other specified parts of digestive tract: Secondary | ICD-10-CM | POA: Diagnosis not present

## 2023-11-10 LAB — CBC
HCT: 35.6 % — ABNORMAL LOW (ref 36.0–46.0)
Hemoglobin: 11.4 g/dL — ABNORMAL LOW (ref 12.0–15.0)
MCH: 31.4 pg (ref 26.0–34.0)
MCHC: 32 g/dL (ref 30.0–36.0)
MCV: 98.1 fL (ref 80.0–100.0)
Platelets: 208 10*3/uL (ref 150–400)
RBC: 3.63 MIL/uL — ABNORMAL LOW (ref 3.87–5.11)
RDW: 14.6 % (ref 11.5–15.5)
WBC: 11.4 10*3/uL — ABNORMAL HIGH (ref 4.0–10.5)
nRBC: 0 % (ref 0.0–0.2)

## 2023-11-10 MED ORDER — METHOCARBAMOL 500 MG PO TABS
500.0000 mg | ORAL_TABLET | Freq: Three times a day (TID) | ORAL | Status: DC
  Administered 2023-11-10 – 2023-11-12 (×6): 500 mg via ORAL
  Filled 2023-11-10 (×6): qty 1

## 2023-11-10 MED ORDER — ACETAMINOPHEN 325 MG PO TABS
650.0000 mg | ORAL_TABLET | ORAL | Status: DC
  Administered 2023-11-10 – 2023-11-12 (×11): 650 mg via ORAL
  Filled 2023-11-10 (×11): qty 2

## 2023-11-10 MED ORDER — SODIUM CHLORIDE 0.9 % IV BOLUS: 500.0000 mL | Freq: Once | INTRAVENOUS | Status: DC

## 2023-11-10 MED ORDER — OXYCODONE HCL 5 MG PO TABS
15.0000 mg | ORAL_TABLET | ORAL | Status: DC | PRN
  Administered 2023-11-10 – 2023-11-11 (×3): 15 mg via ORAL
  Filled 2023-11-10 (×4): qty 3

## 2023-11-10 MED ORDER — OXYCODONE HCL 10 MG PO TABS: 10.0000 mg | ORAL_TABLET | ORAL | 0 refills | Status: DC | PRN

## 2023-11-10 MED ORDER — NAPROXEN 250 MG PO TABS
500.0000 mg | ORAL_TABLET | Freq: Two times a day (BID) | ORAL | Status: DC
  Administered 2023-11-10 – 2023-11-12 (×5): 500 mg via ORAL
  Filled 2023-11-10 (×5): qty 2

## 2023-11-10 MED ORDER — OXYCODONE HCL 5 MG PO TABS
10.0000 mg | ORAL_TABLET | ORAL | Status: DC | PRN
  Administered 2023-11-10 – 2023-11-12 (×7): 10 mg via ORAL
  Filled 2023-11-10 (×6): qty 2

## 2023-11-10 NOTE — Progress Notes (Signed)
     Subjective: 2 Days Post-Op s/p Procedure(s): ARTHROPLASTY, KNEE, TOTAL   Patient is alert, oriented. Frustrated by continued pain and lack of mobility with PT yesterday.  Denies chest pain, SOB, Calf pain. No nausea/vomiting. No other complaints.    Objective:  PE: VITALS:   Vitals:   11/09/23 1401 11/09/23 1720 11/09/23 2229 11/10/23 0634  BP: (!) 105/55 (!) 112/52 128/60 (!) 131/97  Pulse: 65 69 65 63  Resp: 17 18 18 18   Temp: 98.4 F (36.9 C) 98.8 F (37.1 C) 98.5 F (36.9 C) 98.6 F (37 C)  TempSrc: Oral Oral Oral   SpO2: 92% 95% 96% 96%  Weight:      Height:        Sensation intact distally Intact pulses distally Dorsiflexion/Plantar flexion intact Incision: dressing C/D/I  LABS  Results for orders placed or performed during the hospital encounter of 11/08/23 (from the past 24 hours)  CBC     Status: Abnormal   Collection Time: 11/10/23  3:19 AM  Result Value Ref Range   WBC 11.4 (H) 4.0 - 10.5 K/uL   RBC 3.63 (L) 3.87 - 5.11 MIL/uL   Hemoglobin 11.4 (L) 12.0 - 15.0 g/dL   HCT 62.1 (L) 30.8 - 65.7 %   MCV 98.1 80.0 - 100.0 fL   MCH 31.4 26.0 - 34.0 pg   MCHC 32.0 30.0 - 36.0 g/dL   RDW 84.6 96.2 - 95.2 %   Platelets 208 150 - 400 K/uL   nRBC 0.0 0.0 - 0.2 %    DG Knee Left Port Result Date: 11/09/2023 CLINICAL DATA:  Status post knee replacement. EXAM: PORTABLE LEFT KNEE - 1-2 VIEW COMPARISON:  None Available. FINDINGS: Left knee arthroplasty in expected alignment. No periprosthetic lucency or fracture. There has been patellar resurfacing. Recent postsurgical change includes air and edema in the soft tissues and joint space. IMPRESSION: Left knee arthroplasty without immediate postoperative complication. Electronically Signed   By: Narda Rutherford M.D.   On: 11/09/2023 00:08    Assessment/Plan: Principal Problem:   S/P TKR (total knee replacement), left  2 Days Post-Op s/p Procedure(s): ARTHROPLASTY, KNEE, TOTAL  Weightbearing: WBAT LLE, up  with therapy Insicional and dressing care: Reinforce dressings as needed VTE prophylaxis: Aspirin 325mg  BID  x 30 days Pain control:  Added on Naproxen and scheduled tylenol Will try 15 mg oxycodone for next dose to see if this can give her some better control, if not may switch her to hydrocodone.  Follow - up plan: 2 weeks with Dr. Dion Saucier Dispo: Hoping to discharge this afternoon, pending progress with PT today and improved pain control  Contact information:   Janine Ores, PA-C Weekdays 8-5  After hours and holidays please check [email or web address] for group call information for [facility]  Armida Sans 11/10/2023, 7:10 AM

## 2023-11-10 NOTE — Progress Notes (Signed)
 Physical Therapy Treatment Patient Details Name: Maria Wang MRN: [id number] DOB: August 13, 1950 Today's Date: 11/10/2023   History of Present Illness Maria Wang is a 73 yo female s/p L TKA 11/08/23. PMH: HTN, breast cancer s/p R lumpectomy    PT Comments  Pt progressing slowly but steadily. Amb short distance and was able to ascend/descend 2 stairs with RW and min assist using posterior technique. After stair training pt c/o "lightheadedness", BP 91/49 sitting EOB, HR 66, SpO2=99% on RA. Pt returned to supine, SCD's reapplied, encouraged pt to hydrate; RN notified. Pt will likely need another day to allow for safe d/c home. Continue PT POC    If plan is discharge home, recommend the following: A lot of help with walking and/or transfers;A lot of help with bathing/dressing/bathroom;Assistance with cooking/housework;Assist for transportation;Help with stairs or ramp for entrance   Can travel by private vehicle        Equipment Recommendations  None recommended by PT    Recommendations for Other Services       Precautions / Restrictions Precautions Precautions: Fall;Knee Recall of Precautions/Restrictions: Intact Required Braces or Orthoses: Knee Immobilizer - Left Knee Immobilizer - Left: Discontinue once straight leg raise with < 10 degree lag Restrictions LLE Weight Bearing Per Provider Order: Weight bearing as tolerated     Mobility  Bed Mobility Overal bed mobility: Needs Assistance Bed Mobility: Supine to Sit, Sit to Supine     Supine to sit: Min assist Sit to supine: Min assist   General bed mobility comments: min assist with LLE. incr time and effort; pt begins to self assist with gait belt, requires assist to complete d/t pain    Transfers Overall transfer level: Needs assistance Equipment used: Rolling walker (2 wheels) Transfers: Sit to/from Stand Sit to Stand: Min assist           General transfer comment: cues for hand placement and assist LLE  position    Ambulation/Gait Ambulation/Gait assistance: Contact guard assist Gait Distance (Feet): 20 Feet Assistive device: Rolling walker (2 wheels) Gait Pattern/deviations: Step-to pattern, Decreased stance time - left, Decreased weight shift to left       General Gait Details: cues for RW position, step length, posture. distance to tolerance. requires incr time however pt demonstrates improved stability, and wt shift to LLE   Stairs Stairs: Yes Stairs assistance: Min assist Stair Management: No rails, Step to pattern, Backwards, With walker Number of Stairs: 2 General stair comments: cues for sequence and technique; good stability, no LOB. KI used. spouse present for session and able to provide assist needed.   Wheelchair Mobility     Tilt Bed    Modified Rankin (Stroke Patients Only)       Balance   Sitting-balance support: Feet supported Sitting balance-Leahy Scale: Good     Standing balance support: Reliant on assistive device for balance, During functional activity, Bilateral upper extremity supported Standing balance-Leahy Scale: Poor                              Communication Communication Communication: No apparent difficulties  Cognition Arousal: Alert Behavior During Therapy: WFL for tasks assessed/performed   PT - Cognitive impairments: No apparent impairments                         Following commands: Intact      Cueing Cueing Techniques: Verbal cues  Exercises Total Joint  Exercises Ankle Circles/Pumps: AROM, Both, 10 reps Heel Slides: AAROM, Left, 10 reps    General Comments        Pertinent Vitals/Pain Pain Assessment Pain Assessment: 0-10 Pain Score: 4  Pain Location: L knee with movement Pain Descriptors / Indicators: Guarding, Discomfort, Sore Pain Intervention(s): Limited activity within patient's tolerance, Monitored during session, Premedicated before session, Repositioned    Home Living                           Prior Function            PT Goals (current goals can now be found in the care plan section) Acute Rehab PT Goals Patient Stated Goal: regain strength and independence PT Goal Formulation: With patient Time For Goal Achievement: 11/23/23 Potential to Achieve Goals: Good Progress towards PT goals: Progressing toward goals    Frequency    7X/week      PT Plan      Co-evaluation              AM-PAC PT "6 Clicks" Mobility   Outcome Measure  Help needed turning from your back to your side while in a flat bed without using bedrails?: A Little Help needed moving from lying on your back to sitting on the side of a flat bed without using bedrails?: A Little Help needed moving to and from a bed to a chair (including a wheelchair)?: A Little Help needed standing up from a chair using your arms (e.g., wheelchair or bedside chair)?: A Little Help needed to walk in hospital room?: A Little Help needed climbing 3-5 steps with a railing? : A Little 6 Click Score: 18    End of Session Equipment Utilized During Treatment: Gait belt;Left knee immobilizer Activity Tolerance: Patient tolerated treatment well Patient left: with call bell/phone within reach;in bed;with bed alarm set Nurse Communication: Other (comment) ("lightheaded") PT Visit Diagnosis: Muscle weakness (generalized) (M62.81);Difficulty in walking, not elsewhere classified (R26.2);Pain Pain - Right/Left: Left Pain - part of body: Knee     Time: 1610-9604 PT Time Calculation (min) (ACUTE ONLY): 26 min  Charges:    $Gait Training: 23-37 mins PT General Charges $$ ACUTE PT VISIT: 1 Visit                     Maria Wang, PT  Acute Rehab Dept [facility]) [phone]  11/10/2023    [facility] 11/10/2023, 4:29 PM

## 2023-11-10 NOTE — Progress Notes (Signed)
 Physical Therapy Treatment Patient Details Name: Maria Wang MRN: [id number] DOB: 09-22-1950 Today's Date: 11/10/2023   History of Present Illness Maria Wang is a 73 yo female s/p L TKA 11/08/23. PMH: HTN, breast cancer s/p R lumpectomy    PT Comments  Pt progressing this am. Amb 37' with RW and CGA. Will proceed with stair training this pm in preparation for d/c home     If plan is discharge home, recommend the following: A lot of help with walking and/or transfers;A lot of help with bathing/dressing/bathroom;Assistance with cooking/housework;Assist for transportation;Help with stairs or ramp for entrance   Can travel by private vehicle        Equipment Recommendations  None recommended by PT    Recommendations for Other Services       Precautions / Restrictions Precautions Precautions: Fall;Knee Recall of Precautions/Restrictions: Intact Required Braces or Orthoses: Knee Immobilizer - Left Knee Immobilizer - Left: Discontinue once straight leg raise with < 10 degree lag Restrictions LLE Weight Bearing Per Provider Order: Weight bearing as tolerated     Mobility  Bed Mobility Overal bed mobility: Needs Assistance Bed Mobility: Supine to Sit     Supine to sit: Min assist (HOB  20 degrees)     General bed mobility comments: min assist with LLE. incr time and effort    Transfers Overall transfer level: Needs assistance Equipment used: Rolling walker (2 wheels) Transfers: Sit to/from Stand Sit to Stand: Min assist           General transfer comment: cues for hand placement and assist LLE position    Ambulation/Gait Ambulation/Gait assistance: Contact guard assist Gait Distance (Feet): 25 Feet Assistive device: Rolling walker (2 wheels) Gait Pattern/deviations: Step-to pattern, Decreased stance time - left, Decreased weight shift to left       General Gait Details: cues for RW position, step length, posture. distance to tolerance. requires incr  time however pt demonstrates improved stability, no knee buckling   Stairs             Wheelchair Mobility     Tilt Bed    Modified Rankin (Stroke Patients Only)       Balance   Sitting-balance support: Feet supported Sitting balance-Leahy Scale: Good     Standing balance support: Reliant on assistive device for balance, During functional activity, Bilateral upper extremity supported Standing balance-Leahy Scale: Poor                              Communication Communication Communication: No apparent difficulties  Cognition Arousal: Alert Behavior During Therapy: WFL for tasks assessed/performed   PT - Cognitive impairments: No apparent impairments                         Following commands: Intact      Cueing Cueing Techniques: Verbal cues  Exercises Total Joint Exercises Heel Slides: AAROM, Left, 10 reps    General Comments        Pertinent Vitals/Pain Pain Assessment Pain Assessment: 0-10 Pain Score: 6  Pain Location: L knee with movement Pain Descriptors / Indicators: Grimacing, Guarding, Discomfort Pain Intervention(s): Limited activity within patient's tolerance, Monitored during session, Premedicated before session, Repositioned    Home Living                          Prior Function  PT Goals (current goals can now be found in the care plan section) Acute Rehab PT Goals Patient Stated Goal: regain strength and independence PT Goal Formulation: With patient Time For Goal Achievement: 11/23/23 Potential to Achieve Goals: Good Progress towards PT goals: Progressing toward goals    Frequency    7X/week      PT Plan      Co-evaluation              AM-PAC PT "6 Clicks" Mobility   Outcome Measure  Help needed turning from your back to your side while in a flat bed without using bedrails?: A Little Help needed moving from lying on your back to sitting on the side of a flat bed  without using bedrails?: A Little Help needed moving to and from a bed to a chair (including a wheelchair)?: A Little Help needed standing up from a chair using your arms (e.g., wheelchair or bedside chair)?: A Little Help needed to walk in hospital room?: A Little Help needed climbing 3-5 steps with a railing? : A Little 6 Click Score: 18    End of Session Equipment Utilized During Treatment: Gait belt;Left knee immobilizer Activity Tolerance: Patient tolerated treatment well Patient left: in chair;with call bell/phone within reach;with chair alarm set   PT Visit Diagnosis: Muscle weakness (generalized) (M62.81);Difficulty in walking, not elsewhere classified (R26.2);Pain Pain - Right/Left: Left Pain - part of body: Knee     Time: 1610-9604 PT Time Calculation (min) (ACUTE ONLY): 24 min  Charges:    $Gait Training: 8-22 mins PT General Charges $$ ACUTE PT VISIT: 1 Visit                     Luchiano Viscomi, PT  Acute Rehab Dept ([facility]) [phone]  11/10/2023    [facility] 11/10/2023, 1:18 PM

## 2023-11-10 NOTE — Plan of Care (Signed)
  Problem: Activity: Goal: Range of joint motion will improve Outcome: Progressing   Problem: Pain Management: Goal: Pain level will decrease with appropriate interventions Outcome: Progressing   Problem: Clinical Measurements: Goal: Ability to maintain clinical measurements within normal limits will improve Outcome: Progressing   Problem: Safety: Goal: Ability to remain free from injury will improve Outcome: Progressing

## 2023-11-10 NOTE — Plan of Care (Signed)
   Problem: Activity: Goal: Ability to avoid complications of mobility impairment will improve Outcome: Progressing   Problem: Clinical Measurements: Goal: Postoperative complications will be avoided or minimized Outcome: Progressing   Problem: Pain Management: Goal: Pain level will decrease with appropriate interventions Outcome: Progressing

## 2023-11-11 MED ORDER — SODIUM CHLORIDE 0.9 % IV BOLUS
500.0000 mL | Freq: Once | INTRAVENOUS | Status: AC
  Administered 2023-11-11: 500 mL via INTRAVENOUS

## 2023-11-11 NOTE — Progress Notes (Signed)
 Physical Therapy Treatment Patient Details Name: Maria Wang MRN: [id number] DOB: [birth year] Today's Date: 11/11/2023   History of Present Illness Maria Wang is a 73 yo female s/p L TKA 11/08/23. PMH: HTN, breast cancer s/p R lumpectomy    PT Comments  Pt amb 30 ft x2 with RW and recliner follow for safety, slow step to gait pattern with KI on LLE due to quad weakness demonstrating inability to perform SLR. Pt needing min A to power up with BUE assisting with transfers and step pivot to [facility]. Pt reporting dizziness with mobility, BP this session noted to be within normal range despite sensation and needing seated rest break with gait. Pt is a high fall risk, needing min A with transfers, limited ambulation with RW and dizziness requiring quick seated breaks to resolve. Educated pt on elevating HOB, drinking fluids, nurse in room with fluid bolus set up. Hopeful pt can safely d/c home with spouse support after morning session pending improvement in dizziness symptoms limiting mobility.   If plan is discharge home, recommend the following: Assistance with cooking/housework;Assist for transportation;Help with stairs or ramp for entrance;A little help with walking and/or transfers;A little help with bathing/dressing/bathroom   Can travel by private vehicle        Equipment Recommendations  None recommended by PT    Recommendations for Other Services       Precautions / Restrictions Precautions Precautions: Fall;Knee Recall of Precautions/Restrictions: Intact Required Braces or Orthoses: Knee Immobilizer - Left Restrictions Weight Bearing Restrictions Per Provider Order: No LLE Weight Bearing Per Provider Order: Weight bearing as tolerated     Mobility  Bed Mobility Overal bed mobility: Needs Assistance Bed Mobility: Sit to Supine     Sit to supine: Min assist   General bed mobility comments: min A to lift LLE into bed and reposition    Transfers Overall transfer  level: Needs assistance Equipment used: Rolling walker (2 wheels) Transfers: Sit to/from Stand, Bed to chair/wheelchair/BSC Sit to Stand: Min assist   Step pivot transfers: Min assist       General transfer comment: min A to power up from recliner and BSC, cues for hand placement, LLE slightly extended in KI    Ambulation/Gait Ambulation/Gait assistance: Contact guard assist Gait Distance (Feet): 30 Feet (x2) Assistive device: Rolling walker (2 wheels) Gait Pattern/deviations: Step-to pattern, Decreased stance time - left, Decreased weight shift to left Gait velocity: decreased     General Gait Details: step-to gait pattern, decreased LLE stance and weightbearing, strong BUE assisting on RW, no LE buckling noted with KI on, reports dizziness with amb requiring seated rest break (BP WNL) then able to amb back to room   Stairs             Wheelchair Mobility     Tilt Bed    Modified Rankin (Stroke Patients Only)       Balance   Sitting-balance support: Feet supported Sitting balance-Leahy Scale: Good     Standing balance support: Reliant on assistive device for balance, During functional activity, Bilateral upper extremity supported Standing balance-Leahy Scale: Poor                              Communication Communication Communication: No apparent difficulties  Cognition Arousal: Alert Behavior During Therapy: WFL for tasks assessed/performed   PT - Cognitive impairments: No apparent impairments  Following commands: Intact      Cueing Cueing Techniques: Verbal cues  Exercises      General Comments        Pertinent Vitals/Pain Pain Assessment Pain Assessment: 0-10 Pain Score: 6  Pain Location: L knee with movement Pain Descriptors / Indicators: Guarding, Discomfort, Sore Pain Intervention(s): Limited activity within patient's tolerance, Monitored during session, Premedicated before session,  Repositioned, Ice applied    Home Living                          Prior Function            PT Goals (current goals can now be found in the care plan section) Acute Rehab PT Goals Patient Stated Goal: regain strength and independence PT Goal Formulation: With patient Time For Goal Achievement: 11/23/23 Potential to Achieve Goals: Good Progress towards PT goals: Progressing toward goals    Frequency    7X/week      PT Plan      Co-evaluation              AM-PAC PT "6 Clicks" Mobility   Outcome Measure  Help needed turning from your back to your side while in a flat bed without using bedrails?: A Little Help needed moving from lying on your back to sitting on the side of a flat bed without using bedrails?: A Little Help needed moving to and from a bed to a chair (including a wheelchair)?: A Little Help needed standing up from a chair using your arms (e.g., wheelchair or bedside chair)?: A Little Help needed to walk in hospital room?: A Little Help needed climbing 3-5 steps with a railing? : A Little 6 Click Score: 18    End of Session Equipment Utilized During Treatment: Gait belt;Left knee immobilizer Activity Tolerance: Patient tolerated treatment well;Treatment limited secondary to medical complications (Comment) (dizziness) Patient left: in bed;with call bell/phone within reach;with nursing/sitter in room Nurse Communication: Other (comment);Mobility status (dizziness, BP, IV site burning) PT Visit Diagnosis: Muscle weakness (generalized) (M62.81);Difficulty in walking, not elsewhere classified (R26.2);Pain Pain - Right/Left: Left Pain - part of body: Knee     Time: 1446-1520 PT Time Calculation (min) (ACUTE ONLY): 34 min  Charges:    $Gait Training: 8-22 mins $Therapeutic Activity: 8-22 mins PT General Charges $$ ACUTE PT VISIT: 1 Visit                     Tori Kiari Hosmer PT, DPT 11/11/23, 3:41 PM

## 2023-11-11 NOTE — Progress Notes (Signed)
 Physical Therapy Treatment Patient Details Name: Maria Wang MRN: [id number] DOB: Jun 27, 1951 Today's Date: 11/11/2023   History of Present Illness Maria Wang is a 73 yo female s/p L TKA 11/08/23. PMH: HTN, breast cancer s/p R lumpectomy    PT Comments  POD 3, pt limited by dizziness with ambulation requiring quick seated rest break, BP 105/53- RN notified. Pt needing min A to mobilize LLE to EOB due to failed attempt to self assist and increased pain. Pt powers to stand with min A, strong RLE and BUE assisting. L KI on with mobility due to quad weakness. Pt amb 13ft with RW, CGA with recliner follow, slow step to gait pattern, no LE buckling noted with KI on, 6/10 pain at worst. Pt reports confidence with stair training yesterday. Plan to continue progressing mobility as able and d/c home with spouse support when able.   If plan is discharge home, recommend the following: Assistance with cooking/housework;Assist for transportation;Help with stairs or ramp for entrance;A little help with walking and/or transfers;A little help with bathing/dressing/bathroom   Can travel by private vehicle        Equipment Recommendations  None recommended by PT    Recommendations for Other Services       Precautions / Restrictions Precautions Precautions: Fall;Knee Recall of Precautions/Restrictions: Intact Required Braces or Orthoses: Knee Immobilizer - Left Restrictions LLE Weight Bearing Per Provider Order: Weight bearing as tolerated     Mobility  Bed Mobility Overal bed mobility: Needs Assistance Bed Mobility: Supine to Sit     Supine to sit: Min assist     General bed mobility comments: min A to assist LLE to EOB, pt using bedrail and elevated HOB to assist trunk, attempted to self assist LLE but too painful    Transfers Overall transfer level: Needs assistance Equipment used: Rolling walker (2 wheels) Transfers: Sit to/from Stand Sit to Stand: Min assist            General transfer comment: min A to power to stand, slow to power up    Ambulation/Gait Ambulation/Gait assistance: Contact guard assist Gait Distance (Feet): 16 Feet Assistive device: Rolling walker (2 wheels) Gait Pattern/deviations: Step-to pattern, Decreased stance time - left, Decreased weight shift to left Gait velocity: decreased     General Gait Details: step to gait pattern, cues for sequencing, decreased LLE stance and weightbearing with strong BUE assist, recliner follow, limited by dizziness requiring seated rest break- BP 105/53 RN notified   Stairs             Wheelchair Mobility     Tilt Bed    Modified Rankin (Stroke Patients Only)       Balance   Sitting-balance support: Feet supported Sitting balance-Leahy Scale: Good     Standing balance support: Reliant on assistive device for balance, During functional activity, Bilateral upper extremity supported Standing balance-Leahy Scale: Poor                              Communication Communication Communication: No apparent difficulties  Cognition Arousal: Alert Behavior During Therapy: WFL for tasks assessed/performed   PT - Cognitive impairments: No apparent impairments                         Following commands: Intact      Cueing Cueing Techniques: Verbal cues  Exercises      General Comments  Pertinent Vitals/Pain Pain Assessment Pain Assessment: 0-10 Pain Score: 6  Pain Location: L knee with movement Pain Descriptors / Indicators: Guarding, Discomfort, Sore Pain Intervention(s): Limited activity within patient's tolerance, Monitored during session, Premedicated before session, Repositioned, Ice applied    Home Living                          Prior Function            PT Goals (current goals can now be found in the care plan section) Acute Rehab PT Goals Patient Stated Goal: regain strength and independence PT Goal Formulation: With  patient Time For Goal Achievement: 11/23/23 Potential to Achieve Goals: Good Progress towards PT goals: Progressing toward goals    Frequency    7X/week      PT Plan      Co-evaluation              AM-PAC PT "6 Clicks" Mobility   Outcome Measure  Help needed turning from your back to your side while in a flat bed without using bedrails?: A Little Help needed moving from lying on your back to sitting on the side of a flat bed without using bedrails?: A Little Help needed moving to and from a bed to a chair (including a wheelchair)?: A Little Help needed standing up from a chair using your arms (e.g., wheelchair or bedside chair)?: A Little Help needed to walk in hospital room?: A Little Help needed climbing 3-5 steps with a railing? : A Little 6 Click Score: 18    End of Session Equipment Utilized During Treatment: Gait belt;Left knee immobilizer Activity Tolerance: Patient tolerated treatment well;Treatment limited secondary to medical complications (Comment) (dizziness) Patient left: in chair;with call bell/phone within reach;with chair alarm set Nurse Communication: Other (comment);Mobility status (dizziness, BP) PT Visit Diagnosis: Muscle weakness (generalized) (M62.81);Difficulty in walking, not elsewhere classified (R26.2);Pain Pain - Right/Left: Left Pain - part of body: Knee     Time: 7829-5621 PT Time Calculation (min) (ACUTE ONLY): 28 min  Charges:    $Gait Training: 8-22 mins $Therapeutic Activity: 8-22 mins PT General Charges $$ ACUTE PT VISIT: 1 Visit                     Tori Yamilett Anastos PT, DPT 11/11/23, 1:06 PM

## 2023-11-11 NOTE — Plan of Care (Signed)

## 2023-11-12 NOTE — Progress Notes (Signed)
 Physical Therapy Treatment Patient Details Name: Maria Wang MRN: [id number] DOB: 02-18-1951 Today's Date: 11/12/2023   History of Present Illness Maria Wang is a 73 yo female s/p L TKA 11/08/23. PMH: HTN, breast cancer s/p R lumpectomy    PT Comments  Pt progressing well this session. Amb 16' with RW and CGA, fatigued however Reports only slight lightheadedness after amb, denied feeling as if she was going to pass out. BP 165/93.  Pt states she is ready to d/c home today. Declines repeat stair training and recalls technique from session earlier this week   If plan is discharge home, recommend the following: Assistance with cooking/housework;Assist for transportation;Help with stairs or ramp for entrance;A little help with walking and/or transfers;A little help with bathing/dressing/bathroom   Can travel by private vehicle        Equipment Recommendations  None recommended by PT    Recommendations for Other Services       Precautions / Restrictions Precautions Precautions: Fall;Knee Recall of Precautions/Restrictions: Intact Precaution/Restrictions Comments: monitor BP Knee Immobilizer - Left: Discontinue once straight leg raise with < 10 degree lag Restrictions LLE Weight Bearing Per Provider Order: Weight bearing as tolerated     Mobility  Bed Mobility   Bed Mobility: Supine to Sit     Supine to sit: Min assist     General bed mobility comments: min A to guide LLE out of bed completely    Transfers Overall transfer level: Needs assistance Equipment used: Rolling walker (2 wheels) Transfers: Sit to/from Stand Sit to Stand: Contact guard assist           General transfer comment: cues for LLE position, hand placement    Ambulation/Gait Ambulation/Gait assistance: Contact guard assist, Supervision Gait Distance (Feet): 50 Feet Assistive device: Rolling walker (2 wheels) Gait Pattern/deviations: Step-to pattern, Decreased stance time - left,  Decreased weight shift to left Gait velocity: decreased     General Gait Details: cues for initial sequence, trunk extension  and upward gaze   Stairs         General stair comments: pt reports that she feels comfortable with stair technique and does not need to practice   Wheelchair Mobility     Tilt Bed    Modified Rankin (Stroke Patients Only)       Balance   Sitting-balance support: Feet supported Sitting balance-Leahy Scale: Good     Standing balance support: Reliant on assistive device for balance, During functional activity, Bilateral upper extremity supported Standing balance-Leahy Scale: Poor                              Communication Communication Communication: No apparent difficulties  Cognition Arousal: Alert Behavior During Therapy: WFL for tasks assessed/performed   PT - Cognitive impairments: No apparent impairments                         Following commands: Intact      Cueing    Exercises Total Joint Exercises Ankle Circles/Pumps: AROM, Both, 10 reps    General Comments        Pertinent Vitals/Pain Pain Assessment Pain Assessment: 0-10 Pain Score: 4  Pain Location: L knee with movement Pain Descriptors / Indicators: Guarding, Discomfort, Sore Pain Intervention(s): Limited activity within patient's tolerance, Monitored during session, Premedicated before session, Repositioned    Home Living  Prior Function            PT Goals (current goals can now be found in the care plan section) Acute Rehab PT Goals Patient Stated Goal: regain strength and independence PT Goal Formulation: With patient Time For Goal Achievement: 11/23/23 Potential to Achieve Goals: Good Progress towards PT goals: Progressing toward goals    Frequency    7X/week      PT Plan      Co-evaluation              AM-PAC PT "6 Clicks" Mobility   Outcome Measure  Help needed turning  from your back to your side while in a flat bed without using bedrails?: A Little Help needed moving from lying on your back to sitting on the side of a flat bed without using bedrails?: A Little Help needed moving to and from a bed to a chair (including a wheelchair)?: A Little Help needed standing up from a chair using your arms (e.g., wheelchair or bedside chair)?: A Little Help needed to walk in hospital room?: A Little Help needed climbing 3-5 steps with a railing? : A Little 6 Click Score: 18    End of Session Equipment Utilized During Treatment: Gait belt Activity Tolerance: Patient tolerated treatment well Patient left: in chair;with call bell/phone within reach;with chair alarm set   PT Visit Diagnosis: Muscle weakness (generalized) (M62.81);Difficulty in walking, not elsewhere classified (R26.2);Pain Pain - Right/Left: Left Pain - part of body: Knee     Time: 1191-4782 PT Time Calculation (min) (ACUTE ONLY): 15 min  Charges:    $Gait Training: 8-22 mins PT General Charges $$ ACUTE PT VISIT: 1 Visit                     Maria Wang, PT  Acute Rehab Dept [facility]) [phone]  11/12/2023    [facility] 11/12/2023, 10:39 AM

## 2023-11-12 NOTE — Plan of Care (Signed)
  Problem: Pain Management: Goal: Pain level will decrease with appropriate interventions Outcome: Progressing   Problem: Clinical Measurements: Goal: Will remain free from infection Outcome: Progressing   Problem: Activity: Goal: Risk for activity intolerance will decrease Outcome: Progressing

## 2023-11-12 NOTE — Progress Notes (Signed)
     Subjective: 4 Days Post-Op s/p Procedure(s): ARTHROPLASTY, KNEE, TOTAL   Patient is alert, oriented. Had some hypotension with therapy yesterday. Wants to go home.   Denies chest pain, SOB, Calf pain. No nausea/vomiting. No other complaints.    Objective:  PE: VITALS:   Vitals:   11/11/23 1139 11/11/23 1331 11/11/23 2057 11/12/23 0526  BP: (!) 105/53 (!) 127/57 (!) 138/51 (!) 162/93  Pulse:  (!) 52 (!) 57 (!) 54  Resp:  18 15 16   Temp:  97.9 F (36.6 C) 97.7 F (36.5 C) 97.7 F (36.5 C)  TempSrc:  Oral Oral Oral  SpO2:  100% 100% 99%  Weight:      Height:        Sensation intact distally Intact pulses distally Dorsiflexion/Plantar flexion intact Incision: dressing C/D/I  LABS  No results found for this or any previous visit (from the past 24 hours).   No results found.   Assessment/Plan: Principal Problem:   S/P TKR (total knee replacement), left Active Problems:   Status post revision of total knee replacement, left  4 Days Post-Op s/p Procedure(s): ARTHROPLASTY, KNEE, TOTAL  Weightbearing: WBAT LLE, up with therapy Insicional and dressing care: Reinforce dressings as needed VTE prophylaxis: Aspirin 325mg  BID  x 30 days Pain control:  PRN pain medications, minimize narcotics as able.   Follow - up plan: 2 weeks with Dr. Agatha Horsfall Dispo: Hoping to discharge this afternoon, pending progress with PT today   Contact information:   After hours and holidays please check [email or web address] for group call information for [facility]  Adine Ahmadi 11/12/2023, 10:28 AM

## 2023-11-13 DIAGNOSIS — Z96652 Presence of left artificial knee joint: Secondary | ICD-10-CM | POA: Diagnosis not present

## 2023-11-13 DIAGNOSIS — F419 Anxiety disorder, unspecified: Secondary | ICD-10-CM | POA: Diagnosis not present

## 2023-11-13 DIAGNOSIS — Z7982 Long term (current) use of aspirin: Secondary | ICD-10-CM | POA: Diagnosis not present

## 2023-11-13 DIAGNOSIS — Z556 Problems related to health literacy: Secondary | ICD-10-CM | POA: Diagnosis not present

## 2023-11-13 DIAGNOSIS — N200 Calculus of kidney: Secondary | ICD-10-CM | POA: Diagnosis not present

## 2023-11-13 DIAGNOSIS — Z9011 Acquired absence of right breast and nipple: Secondary | ICD-10-CM | POA: Diagnosis not present

## 2023-11-13 DIAGNOSIS — I10 Essential (primary) hypertension: Secondary | ICD-10-CM | POA: Diagnosis not present

## 2023-11-13 DIAGNOSIS — F32A Depression, unspecified: Secondary | ICD-10-CM | POA: Diagnosis not present

## 2023-11-13 DIAGNOSIS — Z853 Personal history of malignant neoplasm of breast: Secondary | ICD-10-CM | POA: Diagnosis not present

## 2023-11-13 DIAGNOSIS — Z471 Aftercare following joint replacement surgery: Secondary | ICD-10-CM | POA: Diagnosis not present

## 2023-11-13 DIAGNOSIS — I959 Hypotension, unspecified: Secondary | ICD-10-CM | POA: Diagnosis not present

## 2023-11-13 DIAGNOSIS — Z87891 Personal history of nicotine dependence: Secondary | ICD-10-CM | POA: Diagnosis not present

## 2023-11-15 NOTE — Discharge Summary (Signed)
 Patient ID: Kathline Banbury MRN: [id number] DOB/AGE: [birth year] 73 y.o.  Admit date: 11/08/2023 Discharge date: 11/15/2023  Admission Diagnoses:left knee OA  Discharge Diagnoses:  Principal Problem:   S/P TKR (total knee replacement), left Active Problems:   Status post revision of total knee replacement, left   Past Medical History:  Diagnosis Date   Allergy    Anxiety    Arthritis    Cancer (HCC)    right breast lumpectomy   Cataract    Complication of anesthesia    slow to wake up   Depression    Hepatitis    prb A   History of kidney stones    Hypertension    PONV (postoperative nausea and vomiting)    Sleep apnea    no longr uses cpap   Thyroid disease      Procedures Performed: left total knee arthroplasty  Discharged Condition: stable  Hospital Course: Patient brought in to [facility] for surgery.  Tolerated procedure well.  Was kept for monitoring overnight for pain control and medical monitoring postop. Due to pain control and difficulty passing PT, she required extra nights in the hospital. She was found to be stable for DC home on Saturday 4/12.  Patient was instructed on specific activity restrictions and all questions were answered.   Consults: PT  Significant Diagnostic Studies: No additional pertinent studies  Treatments: Surgery  Discharge Exam: Sensation intact distally Intact pulses distally Dorsiflexion/Plantar flexion intact Incision: dressing C/D/I  Disposition: Discharge disposition: 01-Home or Self Care       Discharge Instructions     Call MD for:  difficulty breathing, headache or visual disturbances   Complete by: As directed    Call MD for:  persistant nausea and vomiting   Complete by: As directed    Call MD for:  redness, tenderness, or signs of infection (pain, swelling, redness, odor or green/yellow discharge around incision site)   Complete by: As directed       Allergies as of 11/12/2023        Reactions   Penicillin G Hives   Other Reaction(s): hives as child Tolerated Ancef 11/09/23   Tetanus Immune Globulin Swelling   Other Reaction(s): severe swelling at site, upper arm   Tetanus Toxoids Swelling   Arm swelled up        Medication List     TAKE these medications    alendronate 70 MG tablet Commonly known as: FOSAMAX Take 1 tablet (70 mg total) by mouth every 7 (seven) days. Take with a full glass of water on an empty stomach.   amLODipine 10 MG tablet Commonly known as: NORVASC Take 1 tablet (10 mg total) by mouth daily.   aspirin EC 325 MG tablet Take 1 tablet (325 mg total) by mouth 2 (two) times daily.   baclofen 10 MG tablet Commonly known as: LIORESAL Take 1 tablet (10 mg total) by mouth 3 (three) times daily. As needed for muscle spasm   buPROPion 300 MG 24 hr tablet Commonly known as: WELLBUTRIN XL Take 1 tablet (300 mg total) by mouth every morning.   CALCIUM 600+D3 PO Take 1 tablet by mouth in the morning and at bedtime.   cholecalciferol 25 MCG (1000 UNIT) tablet Commonly known as: VITAMIN D3 Take 1,000 Units by mouth in the morning.   Coenzyme Q10 300 MG Caps Take 300 mg by mouth in the morning.   COSAMIN DS PO Take 2 tablets by mouth in the morning.  dicyclomine 10 MG capsule Commonly known as: BENTYL Take 10 mg by mouth 4 (four) times daily as needed for spasms.   Fish Oil 1000 MG Caps Take 1,000 mg by mouth in the morning.   FLUoxetine 20 MG capsule Commonly known as: PROZAC Take 1 capsule (20 mg total) by mouth daily.   gabapentin 600 MG tablet Commonly known as: NEURONTIN Take 1 tablet (600 mg total) by mouth 3 (three) times daily.   losartan 100 MG tablet Commonly known as: COZAAR Take 1 tablet (100 mg total) by mouth in the morning.   Lubricant Eye Drops 0.4-0.3 % Soln Generic drug: Polyethyl Glycol-Propyl Glycol Place 1-2 drops into both eyes 3 (three) times daily as needed (dry/irritated eyes.).   MACULAR HEALTH  FORMULA PO Take 1 capsule by mouth in the morning and at bedtime.   MELATONIN PO Take 1 tablet by mouth at bedtime as needed (sleep).   multivitamin with minerals Tabs tablet Take 1 tablet by mouth in the morning. One A Day for Women 50+   naproxen sodium 220 MG tablet Commonly known as: ALEVE Take 440 mg by mouth 2 (two) times daily as needed (pain.).   ondansetron 4 MG tablet Commonly known as: Zofran Take 1 tablet (4 mg total) by mouth every 8 (eight) hours as needed for nausea or vomiting.   oxybutynin 10 MG 24 hr tablet Commonly known as: DITROPAN-XL TAKE 1 TABLET BY MOUTH AT BEDTIME   Oxycodone HCl 10 MG Tabs Take 1-1.5 tablets (10-15 mg total) by mouth every 4 (four) hours as needed (severe pain).   sennosides-docusate sodium 8.6-50 MG tablet Commonly known as: SENOKOT-S Take 2 tablets by mouth daily.   triamcinolone cream 0.1 % Commonly known as: KENALOG Apply 1 application. topically daily as needed (ear eczema).   Vitamin B-12 2500 MCG Subl Place 2,500 mcg under the tongue in the morning.        Follow-up Information     Osa Blase, MD. Go on 11/23/2023.   Specialty: Orthopedic Surgery Why: Your appointment is scheduled for 8:30 Contact information: [address] [place] [address] [phone]         [facility] Follow up.   Why: HHPT will provide 6 PT visits at home prior to starting outpatient physical therapy        [facility] Go on 11/23/2023.   Why: Your outpatient physical therapy is scheduled for 10:15. Please arrive at 10:00 to complete your paperwork Contact information: [facility] [address] [place] [address] [phone]                  Nicholas Bari, PA-C

## 2023-11-23 DIAGNOSIS — M1712 Unilateral primary osteoarthritis, left knee: Secondary | ICD-10-CM | POA: Diagnosis not present

## 2023-11-28 DIAGNOSIS — M1712 Unilateral primary osteoarthritis, left knee: Secondary | ICD-10-CM | POA: Diagnosis not present

## 2023-11-29 ENCOUNTER — Ambulatory Visit: Payer: PPO | Admitting: Family Medicine

## 2023-12-01 DIAGNOSIS — M1712 Unilateral primary osteoarthritis, left knee: Secondary | ICD-10-CM | POA: Diagnosis not present

## 2023-12-05 DIAGNOSIS — M1712 Unilateral primary osteoarthritis, left knee: Secondary | ICD-10-CM | POA: Diagnosis not present

## 2023-12-07 DIAGNOSIS — M1712 Unilateral primary osteoarthritis, left knee: Secondary | ICD-10-CM | POA: Diagnosis not present

## 2023-12-13 DIAGNOSIS — M1712 Unilateral primary osteoarthritis, left knee: Secondary | ICD-10-CM | POA: Diagnosis not present

## 2023-12-15 DIAGNOSIS — M1712 Unilateral primary osteoarthritis, left knee: Secondary | ICD-10-CM | POA: Diagnosis not present

## 2023-12-19 DIAGNOSIS — M1712 Unilateral primary osteoarthritis, left knee: Secondary | ICD-10-CM | POA: Diagnosis not present

## 2023-12-21 DIAGNOSIS — M1711 Unilateral primary osteoarthritis, right knee: Secondary | ICD-10-CM | POA: Diagnosis not present

## 2023-12-22 DIAGNOSIS — M1712 Unilateral primary osteoarthritis, left knee: Secondary | ICD-10-CM | POA: Diagnosis not present

## 2023-12-28 DIAGNOSIS — M1712 Unilateral primary osteoarthritis, left knee: Secondary | ICD-10-CM | POA: Diagnosis not present

## 2023-12-29 DIAGNOSIS — M1712 Unilateral primary osteoarthritis, left knee: Secondary | ICD-10-CM | POA: Diagnosis not present

## 2024-01-03 DIAGNOSIS — M1712 Unilateral primary osteoarthritis, left knee: Secondary | ICD-10-CM | POA: Diagnosis not present

## 2024-01-05 DIAGNOSIS — H02051 Trichiasis without entropian right upper eyelid: Secondary | ICD-10-CM | POA: Diagnosis not present

## 2024-01-05 DIAGNOSIS — H353132 Nonexudative age-related macular degeneration, bilateral, intermediate dry stage: Secondary | ICD-10-CM | POA: Diagnosis not present

## 2024-01-05 DIAGNOSIS — H04123 Dry eye syndrome of bilateral lacrimal glands: Secondary | ICD-10-CM | POA: Diagnosis not present

## 2024-01-05 DIAGNOSIS — H524 Presbyopia: Secondary | ICD-10-CM | POA: Diagnosis not present

## 2024-01-05 DIAGNOSIS — M1712 Unilateral primary osteoarthritis, left knee: Secondary | ICD-10-CM | POA: Diagnosis not present

## 2024-01-05 DIAGNOSIS — H40013 Open angle with borderline findings, low risk, bilateral: Secondary | ICD-10-CM | POA: Diagnosis not present

## 2024-01-10 DIAGNOSIS — M1712 Unilateral primary osteoarthritis, left knee: Secondary | ICD-10-CM | POA: Diagnosis not present

## 2024-01-11 ENCOUNTER — Other Ambulatory Visit: Payer: Self-pay | Admitting: Family Medicine

## 2024-01-11 DIAGNOSIS — I10 Essential (primary) hypertension: Secondary | ICD-10-CM

## 2024-01-12 DIAGNOSIS — M1712 Unilateral primary osteoarthritis, left knee: Secondary | ICD-10-CM | POA: Diagnosis not present

## 2024-01-16 DIAGNOSIS — M1712 Unilateral primary osteoarthritis, left knee: Secondary | ICD-10-CM | POA: Diagnosis not present

## 2024-01-18 DIAGNOSIS — M1712 Unilateral primary osteoarthritis, left knee: Secondary | ICD-10-CM | POA: Diagnosis not present

## 2024-01-20 ENCOUNTER — Other Ambulatory Visit: Payer: Self-pay | Admitting: Family Medicine

## 2024-01-20 DIAGNOSIS — M810 Age-related osteoporosis without current pathological fracture: Secondary | ICD-10-CM

## 2024-01-31 ENCOUNTER — Ambulatory Visit (INDEPENDENT_AMBULATORY_CARE_PROVIDER_SITE_OTHER): Payer: PPO

## 2024-01-31 VITALS — Ht 63.0 in | Wt 217.0 lb

## 2024-01-31 DIAGNOSIS — Z0000 Encounter for general adult medical examination without abnormal findings: Secondary | ICD-10-CM

## 2024-01-31 DIAGNOSIS — M1712 Unilateral primary osteoarthritis, left knee: Secondary | ICD-10-CM | POA: Diagnosis not present

## 2024-01-31 NOTE — Progress Notes (Signed)
 Subjective:   Maria Wang is a 73 y.o. who presents for a Medicare Wellness preventive visit.  As a reminder, Annual Wellness Visits don't include a physical exam, and some assessments may be limited, especially if this visit is performed virtually. We may recommend an in-person follow-up visit with your provider if needed.  Visit Complete: Virtual I connected with  Maria Wang on 01/31/24 by a audio enabled telemedicine application and verified that I am speaking with the correct person using two identifiers.  Patient Location: Home  Provider Location: Office/Clinic  I discussed the limitations of evaluation and management by telemedicine. The patient expressed understanding and agreed to proceed.  Vital Signs: Because this visit was a virtual/telehealth visit, some criteria may be missing or patient reported. Any vitals not documented were not able to be obtained and vitals that have been documented are patient reported.  VideoDeclined- This patient declined Librarian, academic. Therefore the visit was completed with audio only.  Persons Participating in Visit: Patient.  AWV Questionnaire: No: Patient Medicare AWV questionnaire was not completed prior to this visit.  Cardiac Risk Factors include: advanced age (>65men, >86 women);dyslipidemia;hypertension;obesity (BMI >30kg/m2)     Objective:    Today's Vitals   01/31/24 1052  Weight: 217 lb (98.4 kg)  Height: 5' 3 (1.6 m)   Body mass index is 38.44 kg/m.     01/31/2024   10:57 AM 11/08/2023   11:00 PM 10/27/2023    9:58 AM 08/31/2023    3:40 PM 01/06/2023   12:05 PM 01/08/2022    1:31 PM 12/31/2021    9:11 AM  Advanced Directives  Does Patient Have a Medical Advance Directive? No No No No No No No  Would patient like information on creating a medical advance directive? No - Patient declined No - Patient declined  No - Patient declined No - Patient declined No - Patient declined      Current Medications (verified) Outpatient Encounter Medications as of 01/31/2024  Medication Sig   alendronate  (FOSAMAX ) 70 MG tablet TAKE 1 TABLET BY MOUTH ONCE WEEKLY.   amLODipine  (NORVASC ) 10 MG tablet TAKE 1 TABLET BY MOUTH ONCE DAILY   baclofen  (LIORESAL ) 10 MG tablet Take 1 tablet (10 mg total) by mouth 3 (three) times daily. As needed for muscle spasm   buPROPion  (WELLBUTRIN  XL) 300 MG 24 hr tablet Take 1 tablet (300 mg total) by mouth every morning.   Calcium  Carb-Cholecalciferol  (CALCIUM  600+D3 PO) Take 1 tablet by mouth in the morning and at bedtime.   cholecalciferol  (VITAMIN D ) 25 MCG (1000 UNIT) tablet Take 1,000 Units by mouth in the morning.   Coenzyme Q10 300 MG CAPS Take 300 mg by mouth in the morning.   Cyanocobalamin (VITAMIN B-12) 2500 MCG SUBL Place 2,500 mcg under the tongue in the morning.   dicyclomine (BENTYL) 10 MG capsule Take 10 mg by mouth 4 (four) times daily as needed for spasms.   FLUoxetine  (PROZAC ) 20 MG capsule Take 1 capsule (20 mg total) by mouth daily.   gabapentin  (NEURONTIN ) 600 MG tablet Take 1 tablet (600 mg total) by mouth 3 (three) times daily.   Glucosamine-Chondroitin (COSAMIN DS PO) Take 2 tablets by mouth in the morning.   losartan  (COZAAR ) 100 MG tablet Take 1 tablet (100 mg total) by mouth in the morning.   MELATONIN PO Take 1 tablet by mouth at bedtime as needed (sleep).   Multiple Vitamin (MULTIVITAMIN WITH MINERALS) TABS tablet Take 1 tablet  by mouth in the morning. One A Day for Women 50+   Multiple Vitamins-Minerals (MACULAR HEALTH FORMULA PO) Take 1 capsule by mouth in the morning and at bedtime.   naproxen  sodium (ALEVE ) 220 MG tablet Take 440 mg by mouth 2 (two) times daily as needed (pain.).   Omega-3 Fatty Acids (FISH OIL) 1000 MG CAPS Take 1,000 mg by mouth in the morning.   ondansetron  (ZOFRAN ) 4 MG tablet Take 1 tablet (4 mg total) by mouth every 8 (eight) hours as needed for nausea or vomiting.   Polyethyl Glycol-Propyl Glycol  (LUBRICANT EYE DROPS) 0.4-0.3 % SOLN Place 1-2 drops into both eyes 3 (three) times daily as needed (dry/irritated eyes.).   sennosides-docusate sodium  (SENOKOT-S) 8.6-50 MG tablet Take 2 tablets by mouth daily.   triamcinolone  cream (KENALOG ) 0.1 % Apply 1 application. topically daily as needed (ear eczema).   aspirin  EC 325 MG tablet Take 1 tablet (325 mg total) by mouth 2 (two) times daily. (Patient not taking: Reported on 01/31/2024)   oxybutynin  (DITROPAN -XL) 10 MG 24 hr tablet TAKE 1 TABLET BY MOUTH AT BEDTIME (Patient not taking: Reported on 01/31/2024)   Oxycodone  HCl 10 MG TABS Take 1-1.5 tablets (10-15 mg total) by mouth every 4 (four) hours as needed (severe pain). (Patient not taking: Reported on 01/31/2024)   No facility-administered encounter medications on file as of 01/31/2024.    Allergies (verified) Penicillin g, Tetanus immune globulin, and Tetanus toxoids   History: Past Medical History:  Diagnosis Date   Allergy    Anxiety    Arthritis    Cancer (HCC)    right breast lumpectomy   Cataract    Complication of anesthesia    slow to wake up   Depression    Hepatitis    prb A   History of kidney stones    Hypertension    PONV (postoperative nausea and vomiting)    Sleep apnea    no longr uses cpap   Thyroid  disease    Past Surgical History:  Procedure Laterality Date   BREAST SURGERY Right    lumpectomy   chholecystectomy      CHOLECYSTECTOMY  1987   HERNIA REPAIR     umbilical   kn ee surgery  Left 1984   scope   LAPAROSCOPIC SMALL BOWEL RESECTION N/A 01/08/2022   Procedure: LAPAROSCOPIC SMALL BOWEL RESECTION;  Surgeon: Gladis Cough, MD;  Location: [facility];  Service: General;  Laterality: N/A;   right breast lumpectomy   2015   SMALL INTESTINE SURGERY     divertic of small intestine   TOTAL KNEE ARTHROPLASTY Left 11/08/2023   Procedure: ARTHROPLASTY, KNEE, TOTAL;  Surgeon: Josefina Chew, MD;  Location: [facility];  Service: Orthopedics;  Laterality: Left;    UMBILICAL HERNIA REPAIR     Family History  Problem Relation Age of Onset   Arthritis Mother    Miscarriages / [place] Mother    Alzheimer's disease Mother    Cancer Father    Depression Sister    ADD / ADHD Brother    Alcohol  abuse Brother    Drug abuse Brother    Alzheimer's disease Maternal Aunt    Diabetes Maternal Grandmother    Diabetes Paternal Grandmother    Obesity Paternal Grandmother    Social History   Socioeconomic History   Marital status: Married    Spouse name: Not on file   Number of children: 3   Years of education: Not on file   Highest education  level: Not on file  Occupational History   Not on file  Tobacco Use   Smoking status: Former    Current packs/day: 0.00    Average packs/day: 2.0 packs/day for 5.0 years (10.0 ttl pk-yrs)    Types: Cigarettes    Start date: 07/02/1970    Quit date: 07/03/1975    Years since quitting: 48.6   Smokeless tobacco: Never  Vaping Use   Vaping status: Never Used  Substance and Sexual Activity   Alcohol  use: Yes    Alcohol /week: 8.0 - 10.0 standard drinks of alcohol     Types: 8 - 10 Shots of liquor per week    Comment: on occas   Drug use: Not Currently    Types: Marijuana    Comment: hx of as a teenager   Sexual activity: Not Currently    Birth control/protection: Abstinence  Other Topics Concern   Not on file  Social History Narrative   [id number]-twins(boy and girl)      Retired.  Religious educator   Social Drivers of Health   Financial Resource Strain: Low Risk  (01/31/2024)   Overall Financial Resource Strain (CARDIA)    Difficulty of Paying Living Expenses: Not hard at all  Food Insecurity: No Food Insecurity (01/31/2024)   Hunger Vital Sign    Worried About Running Out of Food in the Last Year: Never true    Ran Out of Food in the Last Year: Never true  Transportation Needs: No Transportation Needs (01/31/2024)   PRAPARE - Administrator, Civil Service (Medical): No    Lack of  Transportation (Non-Medical): No  Physical Activity: Sufficiently Active (01/31/2024)   Exercise Vital Sign    Days of Exercise per Week: 3 days    Minutes of Exercise per Session: 60 min  Stress: No Stress Concern Present (01/31/2024)   [employer] of Occupational Health - Occupational Stress Questionnaire    Feeling of Stress: Not at all  Social Connections: Socially Integrated (01/31/2024)   Social Connection and Isolation Panel    Frequency of Communication with Friends and Family: More than three times a week    Frequency of Social Gatherings with Friends and Family: More than three times a week    Attends Religious Services: More than 4 times per year    Active Member of [employer] or Organizations: Yes    Attends Banker Meetings: 1 to 4 times per year    Marital Status: Married    Tobacco Counseling Counseling given: Not Answered    Clinical Intake:  Pre-visit preparation completed: Yes  Pain : No/denies pain     BMI - recorded: 38.44 Nutritional Status: BMI > 30  Obese Nutritional Risks: None Diabetes: No  Lab Results  Component Value Date   HGBA1C 5.5 05/31/2023   HGBA1C 5.4 11/29/2022     How often do you need to have someone help you when you read instructions, pamphlets, or other written materials from your doctor or pharmacy?: 1 - Never  Interpreter Needed?: No  Information entered by :: Ellouise Haws, LPN   Activities of Daily Living     01/31/2024   10:54 AM 11/08/2023   11:00 PM  In your present state of health, do you have any difficulty performing the following activities:  Hearing? 0 0  Vision? 0 0  Difficulty concentrating or making decisions? 0 0  Walking or climbing stairs? 1   Comment related to knee replacement   Dressing or bathing? 0  Doing errands, shopping? 0 0  Preparing Food and eating ? N   Using the Toilet? N   In the past six months, have you accidently leaked urine? Y   Comment wear a leakproof lining   Do you  have problems with loss of bowel control? N   Managing your Medications? N   Managing your Finances? N   Housekeeping or managing your Housekeeping? N     Patient Care Team: Wendolyn Jenkins Jansky, MD as PCP - General (Family Medicine)  I have updated your Care Teams any recent Medical Services you may have received from other providers in the past year.     Assessment:   This is a routine wellness examination for Maria Wang.  Hearing/Vision screen Hearing Screening - Comments:: Pt denies hearing issues at this time will need to get checked  Vision Screening - Comments:: Wears rx glasses - up to date with routine eye exams with [facility]     Goals Addressed             This Visit's Progress    Patient Stated       Weight loss        Depression Screen     01/31/2024   10:56 AM 09/05/2023    9:07 AM 05/31/2023    8:49 AM 01/06/2023   12:03 PM 11/29/2022    2:11 PM  PHQ 2/9 Scores  PHQ - 2 Score 0 0 1 0 1  PHQ- 9 Score  4 5  2     Fall Risk     01/31/2024   10:58 AM 05/31/2023    8:49 AM 01/06/2023   12:06 PM 11/29/2022    2:11 PM  Fall Risk   Falls in the past year? 1 1 1 1   Number falls in past yr: 1  1 0  Injury with Fall? 0  0 0  Risk for fall due to : History of fall(s)  Impaired vision;Impaired balance/gait;Impaired mobility Impaired balance/gait  Follow up Falls prevention discussed Falls evaluation completed Falls prevention discussed Falls prevention discussed    MEDICARE RISK AT HOME:  Medicare Risk at Home Any stairs in or around the home?: Yes If so, are there any without handrails?: No Home free of loose throw rugs in walkways, pet beds, electrical cords, etc?: Yes Adequate lighting in your home to reduce risk of falls?: Yes Life alert?: No Use of a cane, walker or w/c?: No Grab bars in the bathroom?: Yes Shower chair or bench in shower?: Yes Elevated toilet seat or a handicapped toilet?: Yes  TIMED UP AND GO:  Was the test performed?  No  Cognitive  Function: 6CIT completed        01/31/2024   10:59 AM 01/06/2023   12:07 PM  6CIT Screen  What Year? 0 points 0 points  What month? 0 points 0 points  What time? 0 points 0 points  Count back from 20 0 points 0 points  Months in reverse 0 points 0 points  Repeat phrase 0 points 0 points  Total Score 0 points 0 points    Immunizations Immunization History  Administered Date(s) Administered   Fluad Quad(high Dose 65+) 06/07/2022   Fluad Trivalent(High Dose 65+) 05/04/2023   Fluzone Influenza virus vaccine,trivalent (IIV3), split virus 04/14/2011, 07/03/2013   Influenza Split 06/07/2007, 06/11/2008, 06/04/2009, 06/20/2012   Influenza, High Dose Seasonal PF 04/21/2021   Influenza,inj,Quad PF,6+ Mos 05/02/2019, 05/06/2020   Influenza,inj,quad, With Preservative 08/27/2014, 09/01/2015   PFIZER Comirnaty(Gray  Top)Covid-19 Tri-Sucrose Vaccine 05/04/2023   PFIZER(Purple Top)SARS-COV-2 Vaccination 10/01/2019, 10/30/2019   Pfizer(Comirnaty)Fall Seasonal Vaccine 12 years and older 06/21/2022   Pneumococcal Conjugate-13 11/01/2016   Pneumococcal Polysaccharide-23 11/04/2017   Rsv, Bivalent, Protein Subunit Rsvpref,pf [place]) 06/07/2022   Zoster Recombinant(Shingrix) 04/17/2020, 09/02/2020   Zoster, Live 09/02/2020    Screening Tests Health Maintenance  Topic Date Due   Zoster Vaccines- Shingrix (2 of 2) 10/28/2020   COVID-19 Vaccine (5 - 2024-25 season) 06/29/2023   INFLUENZA VACCINE  03/02/2024   Medicare Annual Wellness (AWV)  01/30/2025   MAMMOGRAM  01/31/2025   Colonoscopy  08/31/2026   Pneumococcal Vaccine: 50+ Years  Completed   DEXA SCAN  Completed   Hepatitis C Screening  Completed   Hepatitis B Vaccines  Aged Out   HPV VACCINES  Aged Out   Meningococcal B Vaccine  Aged Out   DTaP/Tdap/Td  Discontinued    Health Maintenance  Health Maintenance Due  Topic Date Due   Zoster Vaccines- Shingrix (2 of 2) 10/28/2020   COVID-19 Vaccine (5 - 2024-25 season) 06/29/2023    Health Maintenance Items Addressed: See Nurse Notes at the end of this note  Additional Screening:  Vision Screening: Recommended annual ophthalmology exams for early detection of glaucoma and other disorders of the eye. Would you like a referral to an eye doctor? No    Dental Screening: Recommended annual dental exams for proper oral hygiene  Community Resource Referral / Chronic Care Management: CRR required this visit?  No   CCM required this visit?  No   Plan:    I have personally reviewed and noted the following in the patient's chart:   Medical and social history Use of alcohol , tobacco or illicit drugs  Current medications and supplements including opioid prescriptions. Patient is not currently taking opioid prescriptions. Functional ability and status Nutritional status Physical activity Advanced directives List of other physicians Hospitalizations, surgeries, and ER visits in previous 12 months Vitals Screenings to include cognitive, depression, and falls Referrals and appointments  In addition, I have reviewed and discussed with patient certain preventive protocols, quality metrics, and best practice recommendations. A written personalized care plan for preventive services as well as general preventive health recommendations were provided to patient.   Ellouise VEAR Haws, LPN   09/09/7972   After Visit Summary: (MyChart) Due to this being a telephonic visit, the after visit summary with patients personalized plan was offered to patient via MyChart   Notes: Nothing significant to report at this time.

## 2024-01-31 NOTE — Patient Instructions (Signed)
 Maria Wang , Thank you for taking time out of your busy schedule to complete your Annual Wellness Visit with me. I enjoyed our conversation and look forward to speaking with you again next year. I, as well as your care team,  appreciate your ongoing commitment to your health goals. Please review the following plan we discussed and let me know if I can assist you in the future. Your Game plan/ To Do List    Referrals: If you haven't heard from the office you've been referred to, please reach out to them at the phone provided.   Follow up Visits: Next Medicare AWV with our clinical staff: 02/05/25   Have you seen your provider in the last 6 months (3 months if uncontrolled diabetes)? Yes Next Office Visit with your provider: 04/05/24  Clinician Recommendations:  Aim for 30 minutes of exercise or brisk walking, 6-8 glasses of water , and 5 servings of fruits and vegetables each day.       This is a list of the screening recommended for you and due dates:  Health Maintenance  Topic Date Due   Zoster (Shingles) Vaccine (2 of 2) 10/28/2020   COVID-19 Vaccine (5 - 2024-25 season) 06/29/2023   Flu Shot  03/02/2024   Medicare Annual Wellness Visit  01/30/2025   Mammogram  01/31/2025   Colon Cancer Screening  08/31/2026   Pneumococcal Vaccine for age over 67  Completed   DEXA scan (bone density measurement)  Completed   Hepatitis C Screening  Completed   Hepatitis B Vaccine  Aged Out   HPV Vaccine  Aged Out   Meningitis B Vaccine  Aged Out   DTaP/Tdap/Td vaccine  Discontinued    Advanced directives: (Declined) Advance directive discussed with you today. Even though you declined this today, please call our office should you change your mind, and we can give you the proper paperwork for you to fill out. Advance Care Planning is important because it:  [x]  Makes sure you receive the medical care that is consistent with your values, goals, and preferences  [x]  It provides guidance to your family and  loved ones and reduces their decisional burden about whether or not they are making the right decisions based on your wishes.  Follow the link provided in your after visit summary or read over the paperwork we have mailed to you to help you started getting your Advance Directives in place. If you need assistance in completing these, please reach out to us  so that we can help you!  See attachments for Preventive Care and Fall Prevention Tips.

## 2024-02-02 DIAGNOSIS — M1712 Unilateral primary osteoarthritis, left knee: Secondary | ICD-10-CM | POA: Diagnosis not present

## 2024-02-07 DIAGNOSIS — Z1231 Encounter for screening mammogram for malignant neoplasm of breast: Secondary | ICD-10-CM | POA: Diagnosis not present

## 2024-02-07 DIAGNOSIS — M1712 Unilateral primary osteoarthritis, left knee: Secondary | ICD-10-CM | POA: Diagnosis not present

## 2024-02-07 LAB — HM MAMMOGRAPHY

## 2024-02-08 ENCOUNTER — Ambulatory Visit: Payer: Self-pay | Admitting: Family Medicine

## 2024-02-14 DIAGNOSIS — M1712 Unilateral primary osteoarthritis, left knee: Secondary | ICD-10-CM | POA: Diagnosis not present

## 2024-02-22 NOTE — Progress Notes (Signed)
 Sent message, via epic in basket, requesting orders in epic from Careers adviser.

## 2024-02-27 DIAGNOSIS — M1711 Unilateral primary osteoarthritis, right knee: Secondary | ICD-10-CM | POA: Diagnosis not present

## 2024-02-27 NOTE — Progress Notes (Signed)
 Second request for pre op orders left voicemail for [facility].

## 2024-02-27 NOTE — H&P (Signed)
 KNEE ARTHROPLASTY ADMISSION H&P  Patient ID: Tashana Haberl MRN: [id number] DOB/AGE: Sep 20, 1950 73 y.o.  Chief Complaint: right knee pain.  Planned Procedure Date: 03/13/24 Medical Clearance by Dr. Wendolyn     HPI: Silveria Botz is a 73 y.o. female who presents for evaluation of Primary osteoarthritis of right knee. The patient has a history of pain and functional disability in the right knee due to arthritis and has failed non-surgical conservative treatments for greater than 12 weeks to include NSAID's and/or analgesics, corticosteriod injections, and activity modification.  Onset of symptoms was gradual, starting >10 years ago with gradually worsening course since that time. The patient noted no past surgery on the right knee.  Patient currently rates pain at 2 out of 10 with activity. Patient has worsening of pain with activity and weight bearing and pain that interferes with activities of daily living.  Patient has evidence of joint space narrowing by imaging studies.  There is no active infection.  Past Medical History:  Diagnosis Date   Allergy    Anxiety    Arthritis    Cancer (HCC)    right breast lumpectomy   Cataract    Complication of anesthesia    slow to wake up   Depression    Hepatitis    prb A   History of kidney stones    Hypertension    PONV (postoperative nausea and vomiting)    Sleep apnea    no longr uses cpap   Thyroid  disease    Past Surgical History:  Procedure Laterality Date   BREAST SURGERY Right    lumpectomy   chholecystectomy      CHOLECYSTECTOMY  1987   HERNIA REPAIR     umbilical   kn ee surgery  Left 1984   scope   LAPAROSCOPIC SMALL BOWEL RESECTION N/A 01/08/2022   Procedure: LAPAROSCOPIC SMALL BOWEL RESECTION;  Surgeon: Gladis Cough, MD;  Location: [facility];  Service: General;  Laterality: N/A;   right breast lumpectomy   2015   SMALL INTESTINE SURGERY     divertic of small intestine   TOTAL KNEE ARTHROPLASTY Left 11/08/2023    Procedure: ARTHROPLASTY, KNEE, TOTAL;  Surgeon: Josefina Chew, MD;  Location: [facility];  Service: Orthopedics;  Laterality: Left;   UMBILICAL HERNIA REPAIR     Allergies  Allergen Reactions   Penicillin G Hives    Other Reaction(s): hives as child Tolerated Ancef  11/09/23   Tetanus Immune Globulin Swelling    Other Reaction(s): severe swelling at site, upper arm   Tetanus Toxoids Swelling    Arm swelled up   Prior to Admission medications   Medication Sig Start Date End Date Taking? Authorizing Provider  alendronate  (FOSAMAX ) 70 MG tablet TAKE 1 TABLET BY MOUTH ONCE WEEKLY. 01/20/24   Wendolyn Jenkins Earnie, MD  amLODipine  (NORVASC ) 10 MG tablet TAKE 1 TABLET BY MOUTH ONCE DAILY 01/11/24   Wendolyn Jenkins Earnie, MD  aspirin  EC 325 MG tablet Take 1 tablet (325 mg total) by mouth 2 (two) times daily. Patient not taking: Reported on 01/31/2024 11/08/23   Niketa Turner K, PA-C  baclofen  (LIORESAL ) 10 MG tablet Take 1 tablet (10 mg total) by mouth 3 (three) times daily. As needed for muscle spasm 11/08/23   Amyria Komar K, PA-C  buPROPion  (WELLBUTRIN  XL) 300 MG 24 hr tablet Take 1 tablet (300 mg total) by mouth every morning. 10/03/23   Wendolyn Jenkins Earnie, MD  Calcium  Carb-Cholecalciferol  (CALCIUM  600+D3 PO) Take 1 tablet by  mouth in the morning and at bedtime.    [provider]  cholecalciferol  (VITAMIN D ) 25 MCG (1000 UNIT) tablet Take 1,000 Units by mouth in the morning.    [provider]  Coenzyme Q10 300 MG CAPS Take 300 mg by mouth in the morning.    [provider]  Cyanocobalamin (VITAMIN B-12) 2500 MCG SUBL Place 2,500 mcg under the tongue in the morning.    [provider]  dicyclomine (BENTYL) 10 MG capsule Take 10 mg by mouth 4 (four) times daily as needed for spasms.    [provider]  FLUoxetine  (PROZAC ) 20 MG capsule Take 1 capsule (20 mg total) by mouth daily. 10/03/23   Wendolyn Jenkins Jansky, MD  gabapentin  (NEURONTIN ) 600 MG tablet Take 1 tablet (600 mg total)  by mouth 3 (three) times daily. 10/03/23   Wendolyn Jenkins Jansky, MD  Glucosamine-Chondroitin (COSAMIN DS PO) Take 2 tablets by mouth in the morning.    [provider]  losartan  (COZAAR ) 100 MG tablet Take 1 tablet (100 mg total) by mouth in the morning. 10/03/23   Wendolyn Jenkins Jansky, MD  MELATONIN PO Take 1 tablet by mouth at bedtime as needed (sleep).    [provider]  Multiple Vitamin (MULTIVITAMIN WITH MINERALS) TABS tablet Take 1 tablet by mouth in the morning. One A Day for Women 50+    [provider]  Multiple Vitamins-Minerals (MACULAR HEALTH FORMULA PO) Take 1 capsule by mouth in the morning and at bedtime.    [provider]  naproxen  sodium (ALEVE ) 220 MG tablet Take 440 mg by mouth 2 (two) times daily as needed (pain.).    [provider]  Omega-3 Fatty Acids (FISH OIL) 1000 MG CAPS Take 1,000 mg by mouth in the morning.    [provider]  ondansetron  (ZOFRAN ) 4 MG tablet Take 1 tablet (4 mg total) by mouth every 8 (eight) hours as needed for nausea or vomiting. 11/08/23   Cybil Senegal K, PA-C  oxybutynin  (DITROPAN -XL) 10 MG 24 hr tablet TAKE 1 TABLET BY MOUTH AT BEDTIME Patient not taking: Reported on 01/31/2024 06/07/23   Wendolyn Jenkins Jansky, MD  Oxycodone  HCl 10 MG TABS Take 1-1.5 tablets (10-15 mg total) by mouth every 4 (four) hours as needed (severe pain). Patient not taking: Reported on 01/31/2024 11/10/23   Yajaira Doffing K, PA-C  Polyethyl Glycol-Propyl Glycol (LUBRICANT EYE DROPS) 0.4-0.3 % SOLN Place 1-2 drops into both eyes 3 (three) times daily as needed (dry/irritated eyes.).    [provider]  sennosides-docusate sodium  (SENOKOT-S) 8.6-50 MG tablet Take 2 tablets by mouth daily. 11/08/23   Ricketta Colantonio K, PA-C  triamcinolone  cream (KENALOG ) 0.1 % Apply 1 application. topically daily as needed (ear eczema).    [provider]   Social History   Socioeconomic History   Marital status: Married    Spouse name: Not on  file   Number of children: 3   Years of education: Not on file   Highest education level: Not on file  Occupational History   Not on file  Tobacco Use   Smoking status: Former    Current packs/day: 0.00    Average packs/day: 2.0 packs/day for 5.0 years (10.0 ttl pk-yrs)    Types: Cigarettes    Start date: 07/02/1970    Quit date: 07/03/1975    Years since quitting: 48.6   Smokeless tobacco: Never  Vaping Use   Vaping status: Never Used  Substance and Sexual Activity  Alcohol  use: Yes    Alcohol /week: 8.0 - 10.0 standard drinks of alcohol     Types: 8 - 10 Shots of liquor per week    Comment: on occas   Drug use: Not Currently    Types: Marijuana    Comment: hx of as a teenager   Sexual activity: Not Currently    Birth control/protection: Abstinence  Other Topics Concern   Not on file  Social History Narrative   [id number]-twins(boy and girl)      Retired.  Religious educator   Social Drivers of Health   Financial Resource Strain: Low Risk  (01/31/2024)   Overall Financial Resource Strain (CARDIA)    Difficulty of Paying Living Expenses: Not hard at all  Food Insecurity: No Food Insecurity (01/31/2024)   Hunger Vital Sign    Worried About Running Out of Food in the Last Year: Never true    Ran Out of Food in the Last Year: Never true  Transportation Needs: No Transportation Needs (01/31/2024)   PRAPARE - Administrator, Civil Service (Medical): No    Lack of Transportation (Non-Medical): No  Physical Activity: Sufficiently Active (01/31/2024)   Exercise Vital Sign    Days of Exercise per Week: 3 days    Minutes of Exercise per Session: 60 min  Stress: No Stress Concern Present (01/31/2024)   [employer] of Occupational Health - Occupational Stress Questionnaire    Feeling of Stress: Not at all  Social Connections: Socially Integrated (01/31/2024)   Social Connection and Isolation Panel    Frequency of Communication with Friends and Family: More than three times  a week    Frequency of Social Gatherings with Friends and Family: More than three times a week    Attends Religious Services: More than 4 times per year    Active Member of [employer] or Organizations: Yes    Attends Banker Meetings: 1 to 4 times per year    Marital Status: Married   Family History  Problem Relation Age of Onset   Arthritis Mother    Miscarriages / [place] Mother    Alzheimer's disease Mother    Cancer Father    Depression Sister    ADD / ADHD Brother    Alcohol  abuse Brother    Drug abuse Brother    Alzheimer's disease Maternal Aunt    Diabetes Maternal Grandmother    Diabetes Paternal Grandmother    Obesity Paternal Grandmother     ROS: Currently denies lightheadedness, dizziness, Fever, chills, CP, SOB.   No personal history of DVT, PE, MI, or CVA. No loose teeth or dentures All other systems have been reviewed and were otherwise currently negative with the exception of those mentioned in the HPI and as above.  Objective: Vitals: Ht: 5' 4 Wt: 226 lbs Temp: 98.3 BP: 106/71 Pulse: 64 O2 94% on room air.   Physical Exam: General: Alert, NAD.  Antalgic Gait  HEENT: EOMI, Good Neck Extension  Pulm: No increased work of breathing.  Clear B/L A/P w/o crackle or wheeze.  CV: RRR, No m/g/r appreciated  GI: soft, NT, ND Neuro: Neuro without gross focal deficit.  Sensation intact distally Skin: No lesions in the area of chief complaint MSK/Surgical Site: right knee  + medial JLT. ROM 3-120.  5/5 strength in extension and flexion.  +EHL/FHL.  NVI.  Stable varus and valgus stress.    Imaging Review Plain radiographs demonstrate severe degenerative joint disease of the right knee.   Preoperative  templating of the joint replacement has been completed, documented, and submitted to the Operating Room personnel in order to optimize intra-operative equipment management.  Assessment: Primary osteoarthritis of right knee    Plan: Plan for  Procedure(s): ARTHROPLASTY, KNEE, TOTAL  The patient history, physical exam, clinical judgement of the provider and imaging are consistent with end stage degenerative joint disease and total joint arthroplasty is deemed medically necessary. The treatment options including medical management, injection therapy, and arthroplasty were discussed at length. The risks and benefits of Procedure(s): ARTHROPLASTY, KNEE, TOTAL were presented and reviewed.  The risks of nonoperative treatment, versus surgical intervention including but not limited to continued pain, aseptic loosening, stiffness, dislocation/subluxation, infection, bleeding, nerve injury, blood clots, cardiopulmonary complications, morbidity, mortality, among others were discussed. The patient verbalizes understanding and wishes to proceed with the plan.  Patient is being admitted for inpatient treatment for surgery, pain control, PT, prophylactic antibiotics, VTE prophylaxis, progressive ambulation, ADL's and discharge planning.   The patient does meet the criteria for TXA which will be used perioperatively.   ASA 325 mg  will be used postoperatively for DVT prophylaxis in addition to SCDs, and early ambulation. The patient is planning to be discharged home with HHPT in care of husband   Army MARLA Daring, DEVONNA 02/27/2024 2:57 PM

## 2024-02-28 NOTE — Patient Instructions (Signed)
 SURGICAL WAITING ROOM VISITATION Patients having surgery or a procedure may have no more than 2 support people in the waiting area - these visitors may rotate in the visitor waiting room.   If the patient needs to stay at the hospital during part of their recovery, the visitor guidelines for inpatient rooms apply.  PRE-OP VISITATION  Pre-op nurse will coordinate an appropriate time for 1 support person to accompany the patient in pre-op.  This support person may not rotate.  This visitor will be contacted when the time is appropriate for the visitor to come back in the pre-op area.  Please refer to the [facility] website for the visitor guidelines for Inpatients (after your surgery is over and you are in a regular room).  You are not required to quarantine at this time prior to your surgery. However, you must do this: Hand Hygiene often Do NOT share personal items Notify your provider if you are in close contact with someone who has COVID or you develop fever 100.4 or greater, new onset of sneezing, cough, sore throat, shortness of breath or body aches.  If you test positive for Covid or have been in contact with anyone that has tested positive in the last 10 days please notify you surgeon.    Your procedure is scheduled on:  Tuesday  March 13, 2024  Report to [facility] Main Entrance: [address] entrance where the [employer] is available.   Report to admitting at:  08:30   AM  Call this number if you have any questions or problems the morning of surgery [phone]  Do not eat food after Midnight the night prior to your surgery/procedure.  After Midnight you may have the following liquids until  08:00  AM DAY OF SURGERY  Clear Liquid Diet Water  Black Coffee (sugar ok, NO MILK/CREAM OR CREAMERS)  Tea (sugar ok, NO MILK/CREAM OR CREAMERS) regular and decaf                             Plain Jell-O  with no fruit (NO RED)                                           Fruit  ices (not with fruit pulp, NO RED)                                     Popsicles (NO RED)                                                                  Juice: NO CITRUS JUICES: only apple, WHITE grape, WHITE cranberry Sports drinks like Gatorade or Powerade (NO RED)                   The day of surgery:  Drink ONE (1) Pre-Surgery Clear Ensure at   08:00  AM the morning of surgery. Drink in one sitting. Do not sip.  This drink was given to you during your hospital pre-op appointment visit. Nothing else to  drink after completing the Pre-Surgery Clear Ensure : No candy, chewing gum or throat lozenges.    FOLLOW ANY ADDITIONAL PRE OP INSTRUCTIONS YOU RECEIVED FROM YOUR SURGEON'S OFFICE!!!   Oral Hygiene is also important to reduce your risk of infection.        Remember - BRUSH YOUR TEETH THE MORNING OF SURGERY WITH YOUR REGULAR TOOTHPASTE  Do NOT smoke after Midnight the night before surgery.  STOP TAKING all Vitamins, Herbs and supplements 1 week before your surgery. DO NOT Fosamax  1 week before surgery  DO NOT take Losartan  the morning of your surgery.   Take ONLY these medicines the morning of surgery with A SIP OF WATER : Amlodipine , Fluoxetine , Bupropion , Gabapentin . You may use your Eye drops if needed.                     You may not have any metal on your body including hair pins, jewelry, and body piercing  Do not wear make-up, lotions, powders, perfumes  or deodorant  Do not wear nail polish including gel and S&S, artificial / acrylic nails, or any other type of covering on natural nails including finger and toenails. If you have artificial nails, gel coating, etc., that needs to be removed by a nail salon, Please have this removed prior to surgery. Not doing so may mean that your surgery could be cancelled or delayed if the Surgeon or anesthesia staff feels like they are unable to monitor you safely.   Do not shave 48 hours prior to surgery to avoid nicks in your skin  which may contribute to postoperative infections.   Contacts, Hearing Aids, dentures or bridgework may not be worn into surgery. DENTURES WILL BE REMOVED PRIOR TO SURGERY PLEASE DO NOT APPLY Poly grip OR ADHESIVES!!!  You may bring a small overnight bag with you on the day of surgery, only pack items that are not valuable. [facility] IS NOT RESPONSIBLE   FOR VALUABLES THAT ARE LOST OR STOLEN.   Do not bring your home medications to the hospital. The Pharmacy will dispense medications listed on your medication list to you during your admission in the Hospital.  Please read over the following fact sheets you were given: IF YOU HAVE QUESTIONS ABOUT YOUR PRE-OP INSTRUCTIONS, PLEASE CALL [phone].     Pre-operative 5 CHG Bath Instructions   You can play a key role in reducing the risk of infection after surgery. Your skin needs to be as free of germs as possible. You can reduce the number of germs on your skin by washing with CHG (chlorhexidine  gluconate) soap before surgery. CHG is an antiseptic soap that kills germs and continues to kill germs even after washing.   DO NOT use if you have an allergy to chlorhexidine /CHG or antibacterial soaps. If your skin becomes reddened or irritated, stop using the CHG and notify one of our RNs at [phone]  Please shower with the CHG soap starting 4 days before surgery using the following schedule: START SHOWERS ON   FRIDAY  March 09, 2024  Please keep in mind the following:  DO NOT shave, including legs and underarms, starting the day of your first shower.   You may shave your face at any point before/day of surgery.   Place clean sheets on your bed the day you start using CHG soap. Use a clean washcloth (not used since being washed) for each shower. DO NOT sleep with pets  once you start using the CHG.   CHG Shower Instructions:  If you choose to wash your hair and private area, wash first with your normal shampoo/soap.  After you use shampoo/soap, rinse your hair and body thoroughly to remove shampoo/soap residue.  Turn the water  OFF and apply about 3 tablespoons (45 ml) of CHG soap to a CLEAN washcloth.  Apply CHG soap ONLY FROM YOUR NECK DOWN TO YOUR TOES (washing for 3-5 minutes)  DO NOT use CHG soap on face, private areas, open wounds, or sores.  Pay special attention to the area where your surgery is being performed.  If you are having back surgery, having someone wash your back for you may be helpful.  Wait 2 minutes after CHG soap is applied, then you may rinse off the CHG soap.  Pat dry with a clean towel  Put on clean clothes/pajamas   If you choose to wear lotion, please use ONLY the CHG-compatible lotions on the back of this paper.     Additional instructions for the day of surgery: DO NOT APPLY any lotions, deodorants, cologne, or perfumes.   Put on clean/comfortable clothes.  Brush your teeth.  Ask your nurse before applying any prescription medications to the skin.      CHG Compatible Lotions   Aveeno Moisturizing lotion  Cetaphil Moisturizing Cream  Cetaphil Moisturizing Lotion  Clairol Herbal Essence Moisturizing Lotion, Dry Skin  Clairol Herbal Essence Moisturizing Lotion, Extra Dry Skin  Clairol Herbal Essence Moisturizing Lotion, Normal Skin  Curel Age Defying Therapeutic Moisturizing Lotion with Alpha Hydroxy  Curel Extreme Care Body Lotion  Curel Soothing Hands Moisturizing Hand Lotion  Curel Therapeutic Moisturizing Cream, Fragrance-Free  Curel Therapeutic Moisturizing Lotion, Fragrance-Free  Curel Therapeutic Moisturizing Lotion, Original Formula  Eucerin Daily Replenishing Lotion  Eucerin Dry Skin Therapy Plus Alpha Hydroxy Crme  Eucerin Dry Skin Therapy Plus Alpha Hydroxy Lotion  Eucerin Original Crme  Eucerin  Original Lotion  Eucerin Plus Crme Eucerin Plus Lotion  Eucerin TriLipid Replenishing Lotion  Keri Anti-Bacterial Hand Lotion  Keri Deep Conditioning Original Lotion Dry Skin Formula Softly Scented  Keri Deep Conditioning Original Lotion, Fragrance Free Sensitive Skin Formula  Keri Lotion Fast Absorbing Fragrance Free Sensitive Skin Formula  Keri Lotion Fast Absorbing Softly Scented Dry Skin Formula  Keri Original Lotion  Keri Skin Renewal Lotion Keri Silky Smooth Lotion  Keri Silky Smooth Sensitive Skin Lotion  Nivea Body Creamy Conditioning Oil  Nivea Body Extra Enriched Lotion  Nivea Body Original Lotion  Nivea Body Sheer Moisturizing Lotion Nivea Crme  Nivea Skin Firming Lotion  NutraDerm 30 Skin Lotion  NutraDerm Skin Lotion  NutraDerm Therapeutic Skin Cream  NutraDerm Therapeutic Skin Lotion  ProShield Protective Hand Cream  Provon moisturizing lotion   FAILURE TO FOLLOW THESE INSTRUCTIONS MAY RESULT IN THE CANCELLATION OF YOUR SURGERY  PATIENT SIGNATURE_________________________________  NURSE SIGNATURE__________________________________  ________________________________________________________________________         Maria Wang    An incentive spirometer is a tool that can help keep your lungs clear and active. This tool measures how well you are filling your lungs with each  breath. Taking long deep breaths may help reverse or decrease the chance of developing breathing (pulmonary) problems (especially infection) following: A long period of time when you are unable to move or be active. BEFORE THE PROCEDURE  If the spirometer includes an indicator to show your best effort, your nurse or respiratory therapist will set it to a desired goal. If possible, sit up straight or lean slightly forward. Try not to slouch. Hold the incentive spirometer in an upright position. INSTRUCTIONS FOR USE  Sit on the edge of your bed if possible, or sit up as far as you  can in bed or on a chair. Hold the incentive spirometer in an upright position. Breathe out normally. Place the mouthpiece in your mouth and seal your lips tightly around it. Breathe in slowly and as deeply as possible, raising the piston or the ball toward the top of the column. Hold your breath for 3-5 seconds or for as long as possible. Allow the piston or ball to fall to the bottom of the column. Remove the mouthpiece from your mouth and breathe out normally. Rest for a few seconds and repeat Steps 1 through 7 at least 10 times every 1-2 hours when you are awake. Take your time and take a few normal breaths between deep breaths. The spirometer may include an indicator to show your best effort. Use the indicator as a goal to work toward during each repetition. After each set of 10 deep breaths, practice coughing to be sure your lungs are clear. If you have an incision (the cut made at the time of surgery), support your incision when coughing by placing a pillow or rolled up towels firmly against it. Once you are able to get out of bed, walk around indoors and cough well. You may stop using the incentive spirometer when instructed by your caregiver.  RISKS AND COMPLICATIONS Take your time so you do not get dizzy or light-headed. If you are in pain, you may need to take or ask for pain medication before doing incentive spirometry. It is harder to take a deep breath if you are having pain. AFTER USE Rest and breathe slowly and easily. It can be helpful to keep track of a log of your progress. Your caregiver can provide you with a simple table to help with this. If you are using the spirometer at home, follow these instructions: SEEK MEDICAL CARE IF:  You are having difficultly using the spirometer. You have trouble using the spirometer as often as instructed. Your pain medication is not giving enough relief while using the spirometer. You develop fever of 100.5 F (38.1 C) or higher.                                                                                                     SEEK IMMEDIATE MEDICAL CARE IF:  You cough up bloody sputum that had not been present before. You develop fever of 102 F (38.9 C) or greater. You develop worsening pain at or near the incision site. MAKE SURE YOU:  Understand these instructions. Will watch  your condition. Will get help right away if you are not doing well or get worse. Document Released: 11/29/2006 Document Revised: 10/11/2011 Document Reviewed: 01/30/2007 [facility] Patient Information 2014 [place], [place].         If you would like to see a video about joint replacement:   [email or web address]

## 2024-02-28 NOTE — Progress Notes (Signed)
 COVID Vaccine received:  []  No [x]  Yes Date of any COVID positive Test in last 62 days:none  PCP - Jenkins Earnie Carrel MD at [facility] Cardiologist - none  Chest x-ray - 08-31-2023  1v  Epic EKG - 09-02-2023  Epic  Stress Test -  ECHO -  Cardiac Cath -  CT Coronary Calcium  score:   Pacemaker / ICD device [x]  No []  Yes   Spinal Cord Stimulator:[x]  No []  Yes       History of Sleep Apnea? []  No [x]  Yes  lost weight CPAP used?- [x]  No []  Yes    Does the patient monitor blood sugar?   [x]  N/A   []  No []  Yes  Patient has: [x]  NO Hx DM   []  Pre-DM   []  DM1  []   DM2 Last A1c was: 5.5 normal  on  05-31-2023     Blood Thinner / Instructions:  none Aspirin  Instructions:  none  ERAS Protocol Ordered: []  No  [x]  Yes PRE-SURGERY [x]  ENSURE  []  G2   Patient is to be NPO after: 0800  Dental hx: []  Dentures:  [x]  N/A      []  Bridge or Partial:                   []  Loose or Damaged teeth:   Comments: Patient was given the 5 CHG shower / bath instructions for TKA surgery along with 2 bottles of the CHG soap. Patient will start this on:  03-09-2024            Activity level: Able to walk up 2 flights of stairs without becoming significantly short of breath or having chest pain?  [x]  No  would have SOB and severe leg pain []    Yes  Patient can perform ADLs without assistance. []  No   [x]   Yes  Anesthesia review: HTN, OSA- no CPAP since weight loss, MDD, slow to wake up  PONV  Patient denies any S&S of respiratory illness or Covid - no shortness of breath, fever, cough or chest pain at PAT appointment.  Patient verbalized understanding and agreement to the Pre-Surgical Instructions that were given to them at this PAT appointment. Patient was also educated of the need to review these PAT instructions again prior to her surgery.I reviewed the appropriate phone numbers to call if they have any and questions or concerns.

## 2024-02-29 ENCOUNTER — Other Ambulatory Visit: Payer: Self-pay

## 2024-02-29 ENCOUNTER — Encounter (HOSPITAL_COMMUNITY)
Admission: RE | Admit: 2024-02-29 | Discharge: 2024-02-29 | Disposition: A | Discharge status: Home / Self Care | Source: Ambulatory Visit | Attending: Orthopedic Surgery | Admitting: Orthopedic Surgery

## 2024-02-29 ENCOUNTER — Encounter (HOSPITAL_COMMUNITY): Payer: Self-pay

## 2024-02-29 VITALS — BP 126/74 | HR 70 | Temp 97.9°F | Resp 22 | Ht 63.0 in | Wt 225.0 lb

## 2024-02-29 DIAGNOSIS — I10 Essential (primary) hypertension: Secondary | ICD-10-CM | POA: Diagnosis not present

## 2024-02-29 DIAGNOSIS — Z01818 Encounter for other preprocedural examination: Secondary | ICD-10-CM | POA: Diagnosis present

## 2024-02-29 DIAGNOSIS — M1711 Unilateral primary osteoarthritis, right knee: Secondary | ICD-10-CM | POA: Diagnosis not present

## 2024-02-29 DIAGNOSIS — Z01812 Encounter for preprocedural laboratory examination: Secondary | ICD-10-CM | POA: Diagnosis not present

## 2024-02-29 LAB — CBC
HCT: 44.1 % (ref 36.0–46.0)
Hemoglobin: 14.2 g/dL (ref 12.0–15.0)
MCH: 30.6 pg (ref 26.0–34.0)
MCHC: 32.2 g/dL (ref 30.0–36.0)
MCV: 95 fL (ref 80.0–100.0)
Platelets: 220 K/uL (ref 150–400)
RBC: 4.64 MIL/uL (ref 3.87–5.11)
RDW: 13.8 % (ref 11.5–15.5)
WBC: 5.9 K/uL (ref 4.0–10.5)
nRBC: 0 % (ref 0.0–0.2)

## 2024-02-29 LAB — BASIC METABOLIC PANEL WITH GFR
Anion gap: 10 (ref 5–15)
BUN: 25 mg/dL — ABNORMAL HIGH (ref 8–23)
CO2: 24 mmol/L (ref 22–32)
Calcium: 9.2 mg/dL (ref 8.9–10.3)
Chloride: 105 mmol/L (ref 98–111)
Creatinine, Ser: 0.78 mg/dL (ref 0.44–1.00)
GFR, Estimated: >60 mL/min (ref >60)
Glucose, Bld: 95 mg/dL (ref 70–99)
Potassium: 4.1 mmol/L (ref 3.5–5.1)
Sodium: 139 mmol/L (ref 135–145)

## 2024-02-29 LAB — SURGICAL PCR SCREEN
MRSA, PCR: NEGATIVE
Staphylococcus aureus: NEGATIVE

## 2024-02-29 NOTE — Care Plan (Signed)
 Ortho Bundle Case Management Note  Patient Details  Name: Maria Wang MRN: [id number] Date of Birth: 1951-05-30  met with patient in the office for H&P. will discharge to home with family to assist. has DME. HHPT set up with [facility] and OPPT set up with [facility]. discharge instructions discussed and questions answered. Patient and MD in agreement. Choice offered.                    DME Arranged:    DME Agency:     HH Arranged:  PT HH Agency:  [facility] ([facility])  Additional Comments: Please contact me with any questions of if this plan should need to change.  Charlies Pitch,  RN,BSN,MHA,CCM  [facility] Orthopaedic Specialist  [phone] 02/29/2024, 3:13 PM

## 2024-03-13 ENCOUNTER — Ambulatory Visit (HOSPITAL_BASED_OUTPATIENT_CLINIC_OR_DEPARTMENT_OTHER): Admitting: Anesthesiology

## 2024-03-13 ENCOUNTER — Encounter (HOSPITAL_COMMUNITY)
Admission: RE | Disposition: A | Discharge status: Home Health | Payer: Self-pay | Source: Ambulatory Visit | Attending: Orthopedic Surgery

## 2024-03-13 ENCOUNTER — Observation Stay (HOSPITAL_COMMUNITY)
Admission: RE | Admit: 2024-03-13 | Discharge: 2024-03-14 | Disposition: A | Discharge status: Home Health | Source: Ambulatory Visit | Attending: Orthopedic Surgery | Admitting: Orthopedic Surgery

## 2024-03-13 ENCOUNTER — Other Ambulatory Visit: Payer: Self-pay

## 2024-03-13 ENCOUNTER — Ambulatory Visit (HOSPITAL_COMMUNITY): Admitting: Anesthesiology

## 2024-03-13 ENCOUNTER — Observation Stay (HOSPITAL_COMMUNITY)

## 2024-03-13 ENCOUNTER — Encounter (HOSPITAL_COMMUNITY): Payer: Self-pay | Admitting: Orthopedic Surgery

## 2024-03-13 DIAGNOSIS — M1711 Unilateral primary osteoarthritis, right knee: Secondary | ICD-10-CM | POA: Diagnosis not present

## 2024-03-13 DIAGNOSIS — Z7982 Long term (current) use of aspirin: Secondary | ICD-10-CM | POA: Insufficient documentation

## 2024-03-13 DIAGNOSIS — Z79899 Other long term (current) drug therapy: Secondary | ICD-10-CM | POA: Insufficient documentation

## 2024-03-13 DIAGNOSIS — F17210 Nicotine dependence, cigarettes, uncomplicated: Secondary | ICD-10-CM | POA: Insufficient documentation

## 2024-03-13 DIAGNOSIS — G4733 Obstructive sleep apnea (adult) (pediatric): Secondary | ICD-10-CM

## 2024-03-13 DIAGNOSIS — Z853 Personal history of malignant neoplasm of breast: Secondary | ICD-10-CM | POA: Diagnosis not present

## 2024-03-13 DIAGNOSIS — M25561 Pain in right knee: Secondary | ICD-10-CM | POA: Diagnosis present

## 2024-03-13 DIAGNOSIS — Z880 Allergy status to penicillin: Secondary | ICD-10-CM | POA: Diagnosis not present

## 2024-03-13 DIAGNOSIS — G8918 Other acute postprocedural pain: Secondary | ICD-10-CM | POA: Diagnosis not present

## 2024-03-13 DIAGNOSIS — Z96651 Presence of right artificial knee joint: Secondary | ICD-10-CM | POA: Diagnosis not present

## 2024-03-13 DIAGNOSIS — Z87891 Personal history of nicotine dependence: Secondary | ICD-10-CM

## 2024-03-13 DIAGNOSIS — I10 Essential (primary) hypertension: Secondary | ICD-10-CM | POA: Diagnosis not present

## 2024-03-13 DIAGNOSIS — Z471 Aftercare following joint replacement surgery: Secondary | ICD-10-CM | POA: Diagnosis not present

## 2024-03-13 HISTORY — PX: TOTAL KNEE ARTHROPLASTY: SHX125

## 2024-03-13 SURGERY — ARTHROPLASTY, KNEE, TOTAL
Anesthesia: Monitor Anesthesia Care | Site: Knee | Laterality: Right

## 2024-03-13 MED ORDER — OXYCODONE HCL 5 MG PO TABS
5.0000 mg | ORAL_TABLET | ORAL | Status: DC | PRN
  Administered 2024-03-13 – 2024-03-14 (×4): 5 mg via ORAL
  Administered 2024-03-14 (×4): 10 mg via ORAL
  Filled 2024-03-13: qty 2
  Filled 2024-03-13: qty 1
  Filled 2024-03-13: qty 2

## 2024-03-13 MED ORDER — ONDANSETRON HCL 4 MG PO TABS: 4.0000 mg | ORAL_TABLET | Freq: Four times a day (QID) | ORAL | Status: DC | PRN

## 2024-03-13 MED ORDER — EPHEDRINE 5 MG/ML INJ
INTRAVENOUS | Status: AC
  Filled 2024-03-13: qty 5

## 2024-03-13 MED ORDER — OXYCODONE HCL 5 MG/5ML PO SOLN: 5.0000 mg | Freq: Once | ORAL | Status: DC | PRN

## 2024-03-13 MED ORDER — PROPOFOL 10 MG/ML IV BOLUS
INTRAVENOUS | Status: AC
  Filled 2024-03-13: qty 20

## 2024-03-13 MED ORDER — BUPIVACAINE IN DEXTROSE 0.75-8.25 % IT SOLN
INTRATHECAL | Status: DC | PRN
Start: 2024-03-13 — End: 2024-03-13
  Administered 2024-03-13 (×2): 1.6 mL via INTRATHECAL

## 2024-03-13 MED ORDER — METHOCARBAMOL 1000 MG/10ML IJ SOLN: 500.0000 mg | Freq: Four times a day (QID) | INTRAMUSCULAR | Status: DC | PRN

## 2024-03-13 MED ORDER — KETOROLAC TROMETHAMINE 30 MG/ML IJ SOLN
INTRAMUSCULAR | Status: AC
  Filled 2024-03-13: qty 1

## 2024-03-13 MED ORDER — METOCLOPRAMIDE HCL 5 MG PO TABS: 5.0000 mg | ORAL_TABLET | Freq: Three times a day (TID) | ORAL | Status: DC | PRN

## 2024-03-13 MED ORDER — POVIDONE-IODINE 10 % EX SWAB: 2.0000 | Freq: Once | CUTANEOUS | Status: DC

## 2024-03-13 MED ORDER — PHENOL 1.4 % MT LIQD
1.0000 | OROMUCOSAL | Status: DC | PRN
Start: 2024-03-13 — End: 2024-03-14

## 2024-03-13 MED ORDER — FENTANYL CITRATE PF 50 MCG/ML IJ SOSY
100.0000 ug | PREFILLED_SYRINGE | INTRAMUSCULAR | Status: AC
  Administered 2024-03-13 (×2): 50 ug via INTRAVENOUS
  Filled 2024-03-13: qty 2

## 2024-03-13 MED ORDER — BUPROPION HCL ER (XL) 300 MG PO TB24
300.0000 mg | ORAL_TABLET | Freq: Every morning | ORAL | Status: DC
  Administered 2024-03-14 (×2): 300 mg via ORAL
  Filled 2024-03-13: qty 1

## 2024-03-13 MED ORDER — GLYCOPYRROLATE 0.2 MG/ML IJ SOLN
INTRAMUSCULAR | Status: DC | PRN
  Administered 2024-03-13 (×2): .2 mg via INTRAVENOUS

## 2024-03-13 MED ORDER — TRANEXAMIC ACID-NACL 1000-0.7 MG/100ML-% IV SOLN
1000.0000 mg | Freq: Once | INTRAVENOUS | Status: AC
  Administered 2024-03-13 (×2): 1000 mg via INTRAVENOUS
  Filled 2024-03-13: qty 100

## 2024-03-13 MED ORDER — OXYCODONE HCL 5 MG PO TABS: 5.0000 mg | ORAL_TABLET | Freq: Once | ORAL | Status: DC | PRN

## 2024-03-13 MED ORDER — POVIDONE-IODINE 7.5 % EX SOLN: Freq: Once | CUTANEOUS | Status: DC

## 2024-03-13 MED ORDER — ACETAMINOPHEN 500 MG PO TABS
1000.0000 mg | ORAL_TABLET | Freq: Once | ORAL | Status: AC
  Administered 2024-03-13 (×2): 1000 mg via ORAL
  Filled 2024-03-13: qty 2

## 2024-03-13 MED ORDER — ONDANSETRON HCL 4 MG/2ML IJ SOLN
INTRAMUSCULAR | Status: DC | PRN
  Administered 2024-03-13 (×2): 4 mg via INTRAVENOUS

## 2024-03-13 MED ORDER — ALUM & MAG HYDROXIDE-SIMETH 200-200-20 MG/5ML PO SUSP: 30.0000 mL | ORAL | Status: DC | PRN

## 2024-03-13 MED ORDER — PROPOFOL 500 MG/50ML IV EMUL
INTRAVENOUS | Status: DC | PRN
  Administered 2024-03-13: 50 mg via INTRAVENOUS
  Administered 2024-03-13 (×2): 150 ug/kg/min via INTRAVENOUS
  Administered 2024-03-13: 50 mg via INTRAVENOUS

## 2024-03-13 MED ORDER — MENTHOL 3 MG MT LOZG: 1.0000 | LOZENGE | OROMUCOSAL | Status: DC | PRN

## 2024-03-13 MED ORDER — ONDANSETRON HCL 4 MG/2ML IJ SOLN: 4.0000 mg | Freq: Once | INTRAMUSCULAR | Status: DC | PRN

## 2024-03-13 MED ORDER — HYDROMORPHONE HCL 1 MG/ML IJ SOLN: 0.5000 mg | INTRAMUSCULAR | Status: DC | PRN

## 2024-03-13 MED ORDER — DOCUSATE SODIUM 100 MG PO CAPS
100.0000 mg | ORAL_CAPSULE | Freq: Two times a day (BID) | ORAL | Status: DC
  Administered 2024-03-13 – 2024-03-14 (×4): 100 mg via ORAL
  Filled 2024-03-13 (×2): qty 1

## 2024-03-13 MED ORDER — LACTATED RINGERS IV SOLN: INTRAVENOUS | Status: DC

## 2024-03-13 MED ORDER — MIDAZOLAM HCL 2 MG/2ML IJ SOLN
2.0000 mg | INTRAMUSCULAR | Status: DC
  Administered 2024-03-13 (×2): 2 mg via INTRAVENOUS
  Filled 2024-03-13: qty 2

## 2024-03-13 MED ORDER — FLUOXETINE HCL 20 MG PO CAPS
20.0000 mg | ORAL_CAPSULE | Freq: Every day | ORAL | Status: DC
  Administered 2024-03-14 (×2): 20 mg via ORAL
  Filled 2024-03-13: qty 1

## 2024-03-13 MED ORDER — ORAL CARE MOUTH RINSE: 15.0000 mL | Freq: Once | OROMUCOSAL | Status: AC

## 2024-03-13 MED ORDER — DIPHENHYDRAMINE HCL 12.5 MG/5ML PO ELIX: 12.5000 mg | ORAL_SOLUTION | ORAL | Status: DC | PRN

## 2024-03-13 MED ORDER — CEFAZOLIN SODIUM-DEXTROSE 2-4 GM/100ML-% IV SOLN
2.0000 g | INTRAVENOUS | Status: AC
  Administered 2024-03-13 (×2): 2 g via INTRAVENOUS
  Filled 2024-03-13: qty 100

## 2024-03-13 MED ORDER — PHENYLEPHRINE 80 MCG/ML (10ML) SYRINGE FOR IV PUSH (FOR BLOOD PRESSURE SUPPORT)
PREFILLED_SYRINGE | INTRAVENOUS | Status: AC
  Filled 2024-03-13: qty 10

## 2024-03-13 MED ORDER — METOCLOPRAMIDE HCL 5 MG/ML IJ SOLN: 5.0000 mg | Freq: Three times a day (TID) | INTRAMUSCULAR | Status: DC | PRN

## 2024-03-13 MED ORDER — GLYCOPYRROLATE 0.2 MG/ML IJ SOLN
INTRAMUSCULAR | Status: AC
  Filled 2024-03-13: qty 1

## 2024-03-13 MED ORDER — DICYCLOMINE HCL 10 MG PO CAPS: 10.0000 mg | ORAL_CAPSULE | Freq: Four times a day (QID) | ORAL | Status: DC | PRN

## 2024-03-13 MED ORDER — AMLODIPINE BESYLATE 10 MG PO TABS
10.0000 mg | ORAL_TABLET | Freq: Every day | ORAL | Status: DC
  Administered 2024-03-14 (×2): 10 mg via ORAL
  Filled 2024-03-13: qty 1

## 2024-03-13 MED ORDER — SUGAMMADEX SODIUM 200 MG/2ML IV SOLN
INTRAVENOUS | Status: AC
  Filled 2024-03-13: qty 2

## 2024-03-13 MED ORDER — DIPHENHYDRAMINE HCL 50 MG/ML IJ SOLN
INTRAMUSCULAR | Status: AC
  Filled 2024-03-13: qty 1

## 2024-03-13 MED ORDER — PROPOFOL 1000 MG/100ML IV EMUL
INTRAVENOUS | Status: AC
  Filled 2024-03-13: qty 100

## 2024-03-13 MED ORDER — METHOCARBAMOL 500 MG PO TABS
500.0000 mg | ORAL_TABLET | Freq: Four times a day (QID) | ORAL | Status: DC | PRN
Start: 2024-03-13 — End: 2024-03-14
  Administered 2024-03-13 (×2): 500 mg via ORAL
  Filled 2024-03-13: qty 1

## 2024-03-13 MED ORDER — 0.9 % SODIUM CHLORIDE (POUR BTL) OPTIME
TOPICAL | Status: DC | PRN
  Administered 2024-03-13 (×2): 1000 mL

## 2024-03-13 MED ORDER — LOSARTAN POTASSIUM 50 MG PO TABS
100.0000 mg | ORAL_TABLET | Freq: Every day | ORAL | Status: DC
  Administered 2024-03-14 (×2): 100 mg via ORAL
  Filled 2024-03-13: qty 2

## 2024-03-13 MED ORDER — ASPIRIN 325 MG PO TBEC
325.0000 mg | DELAYED_RELEASE_TABLET | Freq: Two times a day (BID) | ORAL | Status: DC
  Administered 2024-03-14 (×2): 325 mg via ORAL
  Filled 2024-03-13: qty 1

## 2024-03-13 MED ORDER — DROPERIDOL 2.5 MG/ML IJ SOLN: 0.6250 mg | Freq: Once | INTRAMUSCULAR | Status: DC | PRN

## 2024-03-13 MED ORDER — ACETAMINOPHEN 325 MG PO TABS: 325.0000 mg | ORAL_TABLET | Freq: Four times a day (QID) | ORAL | Status: DC | PRN

## 2024-03-13 MED ORDER — PHENYLEPHRINE HCL-NACL 20-0.9 MG/250ML-% IV SOLN
INTRAVENOUS | Status: DC | PRN
  Administered 2024-03-13 (×2): 15 ug/min via INTRAVENOUS

## 2024-03-13 MED ORDER — TRIAMCINOLONE ACETONIDE 0.1 % EX CREA: 1.0000 | TOPICAL_CREAM | Freq: Every day | CUTANEOUS | Status: DC | PRN

## 2024-03-13 MED ORDER — ACETAMINOPHEN 500 MG PO TABS
1000.0000 mg | ORAL_TABLET | Freq: Four times a day (QID) | ORAL | Status: AC
  Administered 2024-03-13 – 2024-03-14 (×8): 1000 mg via ORAL
  Filled 2024-03-13 (×4): qty 2

## 2024-03-13 MED ORDER — ACETAMINOPHEN 500 MG PO TABS: 1000.0000 mg | ORAL_TABLET | Freq: Four times a day (QID) | ORAL | Status: DC

## 2024-03-13 MED ORDER — KETOROLAC TROMETHAMINE 30 MG/ML IJ SOLN
INTRAMUSCULAR | Status: DC | PRN
  Administered 2024-03-13 (×2): 30 mg via INTRAMUSCULAR

## 2024-03-13 MED ORDER — GABAPENTIN 300 MG PO CAPS
600.0000 mg | ORAL_CAPSULE | Freq: Three times a day (TID) | ORAL | Status: DC
  Administered 2024-03-13 – 2024-03-14 (×4): 600 mg via ORAL
  Filled 2024-03-13 (×2): qty 2

## 2024-03-13 MED ORDER — MAGNESIUM CITRATE PO SOLN: 1.0000 | Freq: Once | ORAL | Status: DC | PRN

## 2024-03-13 MED ORDER — POLYETHYLENE GLYCOL 3350 17 G PO PACK: 17.0000 g | PACK | Freq: Every day | ORAL | Status: DC | PRN

## 2024-03-13 MED ORDER — ONDANSETRON HCL 4 MG/2ML IJ SOLN: 4.0000 mg | Freq: Four times a day (QID) | INTRAMUSCULAR | Status: DC | PRN

## 2024-03-13 MED ORDER — POLYVINYL ALCOHOL 1.4 % OP SOLN: 1.0000 [drp] | Freq: Three times a day (TID) | OPHTHALMIC | Status: DC | PRN

## 2024-03-13 MED ORDER — FENTANYL CITRATE (PF) 100 MCG/2ML IJ SOLN
INTRAMUSCULAR | Status: AC
  Filled 2024-03-13: qty 2

## 2024-03-13 MED ORDER — HYDROMORPHONE HCL 1 MG/ML IJ SOLN: 0.2500 mg | INTRAMUSCULAR | Status: DC | PRN

## 2024-03-13 MED ORDER — ONDANSETRON HCL 4 MG/2ML IJ SOLN
INTRAMUSCULAR | Status: AC
  Filled 2024-03-13: qty 2

## 2024-03-13 MED ORDER — OXYCODONE HCL 5 MG PO TABS
10.0000 mg | ORAL_TABLET | ORAL | Status: DC | PRN
  Filled 2024-03-13: qty 2

## 2024-03-13 MED ORDER — DEXAMETHASONE SODIUM PHOSPHATE 10 MG/ML IJ SOLN
10.0000 mg | Freq: Once | INTRAMUSCULAR | Status: AC
  Administered 2024-03-14 (×2): 10 mg via INTRAVENOUS
  Filled 2024-03-13: qty 1

## 2024-03-13 MED ORDER — BUPIVACAINE-EPINEPHRINE (PF) 0.25% -1:200000 IJ SOLN
INTRAMUSCULAR | Status: AC
  Filled 2024-03-13: qty 30

## 2024-03-13 MED ORDER — PHENYLEPHRINE HCL-NACL 20-0.9 MG/250ML-% IV SOLN
INTRAVENOUS | Status: AC
  Filled 2024-03-13: qty 250

## 2024-03-13 MED ORDER — PROPOFOL 1000 MG/100ML IV EMUL
INTRAVENOUS | Status: AC
Start: 2024-03-13 — End: 2024-03-13
  Filled 2024-03-13: qty 100

## 2024-03-13 MED ORDER — POTASSIUM CHLORIDE IN NACL 20-0.9 MEQ/L-% IV SOLN
INTRAVENOUS | Status: DC
  Filled 2024-03-13: qty 1000

## 2024-03-13 MED ORDER — DEXAMETHASONE SODIUM PHOSPHATE 10 MG/ML IJ SOLN
INTRAMUSCULAR | Status: AC
  Filled 2024-03-13: qty 1

## 2024-03-13 MED ORDER — BISACODYL 10 MG RE SUPP: 10.0000 mg | Freq: Every day | RECTAL | Status: DC | PRN

## 2024-03-13 MED ORDER — DIPHENHYDRAMINE HCL 50 MG/ML IJ SOLN
INTRAMUSCULAR | Status: DC | PRN
Start: 2024-03-13 — End: 2024-03-13
  Administered 2024-03-13 (×2): 12.5 mg via INTRAVENOUS

## 2024-03-13 MED ORDER — BUPIVACAINE HCL 0.25 % IJ SOLN
INTRAMUSCULAR | Status: DC | PRN
  Administered 2024-03-13 (×2): 30 mL

## 2024-03-13 MED ORDER — DEXAMETHASONE SODIUM PHOSPHATE 10 MG/ML IJ SOLN
INTRAMUSCULAR | Status: DC | PRN
  Administered 2024-03-13 (×2): 8 mg via INTRAVENOUS

## 2024-03-13 MED ORDER — ROPIVACAINE HCL 5 MG/ML IJ SOLN
INTRAMUSCULAR | Status: DC | PRN
  Administered 2024-03-13 (×2): 30 mL via PERINEURAL

## 2024-03-13 MED ORDER — TRANEXAMIC ACID-NACL 1000-0.7 MG/100ML-% IV SOLN
1000.0000 mg | INTRAVENOUS | Status: AC
  Administered 2024-03-13 (×2): 1000 mg via INTRAVENOUS
  Filled 2024-03-13: qty 100

## 2024-03-13 MED ORDER — CHLORHEXIDINE GLUCONATE 0.12 % MT SOLN
15.0000 mL | Freq: Once | OROMUCOSAL | Status: AC
  Administered 2024-03-13 (×2): 15 mL via OROMUCOSAL

## 2024-03-13 MED ORDER — SODIUM CHLORIDE 0.9 % IR SOLN
Status: DC | PRN
  Administered 2024-03-13 (×2): 1000 mL

## 2024-03-13 SURGICAL SUPPLY — 48 items
ATTUNE PS FEM RT SZ 4 CEM KNEE (Femur) IMPLANT
BAG COUNTER SPONGE SURGICOUNT (BAG) IMPLANT
BAG ZIPLOCK 12X15 (MISCELLANEOUS) IMPLANT
BASEPLATE TIB CMT FB PCKT SZ4 (Stem) IMPLANT
BLADE SAG 18X100X1.27 (BLADE) ×2 IMPLANT
BLADE SAW SGTL 11.0X1.19X90.0M (BLADE) IMPLANT
BLADE SAW SGTL 13X75X1.27 (BLADE) ×2 IMPLANT
BLADE SURG 15 STRL LF DISP TIS (BLADE) ×2 IMPLANT
BNDG ELASTIC 6X10 VLCR STRL LF (GAUZE/BANDAGES/DRESSINGS) ×2 IMPLANT
BOWL SMART MIX CTS (DISPOSABLE) ×2 IMPLANT
CEMENT HV SMART SET (Cement) ×4 IMPLANT
CLSR STERI-STRIP ANTIMIC 1/2X4 (GAUZE/BANDAGES/DRESSINGS) ×4 IMPLANT
COVER SURGICAL LIGHT HANDLE (MISCELLANEOUS) ×2 IMPLANT
CUFF TRNQT CYL 34X4.125X (TOURNIQUET CUFF) ×2 IMPLANT
DRAPE SHEET LG 3/4 BI-LAMINATE (DRAPES) ×2 IMPLANT
DRAPE U-SHAPE 47X51 STRL (DRAPES) ×2 IMPLANT
DRSG MEPILEX POST OP 4X12 (GAUZE/BANDAGES/DRESSINGS) ×2 IMPLANT
DURAPREP 26ML APPLICATOR (WOUND CARE) ×4 IMPLANT
ELECT PENCIL ROCKER SW 15FT (MISCELLANEOUS) ×2 IMPLANT
ELECT REM PT RETURN 15FT ADLT (MISCELLANEOUS) ×2 IMPLANT
GAUZE PAD ABD 8X10 STRL (GAUZE/BANDAGES/DRESSINGS) ×4 IMPLANT
GLOVE BIO SURGEON STRL SZ 6.5 (GLOVE) ×2 IMPLANT
GLOVE BIO SURGEON STRL SZ7.5 (GLOVE) ×2 IMPLANT
GLOVE BIOGEL PI IND STRL 7.0 (GLOVE) ×2 IMPLANT
GLOVE BIOGEL PI IND STRL 8 (GLOVE) ×2 IMPLANT
GOWN STRL SURGICAL XL XLNG (GOWN DISPOSABLE) ×4 IMPLANT
HOLDER FOLEY CATH W/STRAP (MISCELLANEOUS) ×2 IMPLANT
HOOD PEEL AWAY T7 (MISCELLANEOUS) ×6 IMPLANT
IMMOBILIZER KNEE 20 THIGH 36 (SOFTGOODS) ×2 IMPLANT
INSERT TIB ATTUNE FB SZ4X7 (Insert) IMPLANT
KIT TURNOVER KIT A (KITS) ×2 IMPLANT
MANIFOLD NEPTUNE II (INSTRUMENTS) ×2 IMPLANT
NS IRRIG 1000ML POUR BTL (IV SOLUTION) ×2 IMPLANT
PACK TOTAL KNEE CUSTOM (KITS) ×3 IMPLANT
PATELLA MEDIAL ATTUN 35MM KNEE (Knees) IMPLANT
PENCIL SMOKE EVACUATOR (MISCELLANEOUS) ×2 IMPLANT
PROTECTOR NERVE ULNAR (MISCELLANEOUS) ×2 IMPLANT
SET HNDPC FAN SPRY TIP SCT (DISPOSABLE) ×2 IMPLANT
SET PAD KNEE POSITIONER (MISCELLANEOUS) ×2 IMPLANT
SPIKE FLUID TRANSFER (MISCELLANEOUS) IMPLANT
SUT MNCRL AB 3-0 PS2 18 (SUTURE) IMPLANT
SUT STRATAFIX PDS+ 0 24IN (SUTURE) ×2 IMPLANT
SUT VIC AB 2-0 CT1 TAPERPNT 27 (SUTURE) ×4 IMPLANT
SUT VIC AB 3-0 SH 27X BRD (SUTURE) ×4 IMPLANT
TRAY FOLEY MTR SLVR 16FR STAT (SET/KITS/TRAYS/PACK) ×2 IMPLANT
TUBE SUCTION HIGH CAP CLEAR NV (SUCTIONS) ×2 IMPLANT
WATER STERILE IRR 1000ML POUR (IV SOLUTION) ×4 IMPLANT
WRAP KNEE MAXI GEL POST OP (GAUZE/BANDAGES/DRESSINGS) ×2 IMPLANT

## 2024-03-13 NOTE — Anesthesia Preprocedure Evaluation (Addendum)
 Anesthesia Evaluation  Patient identified by MRN, date of birth, ID band Patient awake    Reviewed: Allergy & Precautions, H&P , NPO status , Patient's Chart, lab work & pertinent test results  History of Anesthesia Complications (+) PONV and history of anesthetic complications  Airway Mallampati: II  TM Distance: >3 FB Neck ROM: Full    Dental no notable dental hx. (+) Teeth Intact, Dental Advisory Given   Pulmonary sleep apnea and Continuous Positive Airway Pressure Ventilation , former smoker   Pulmonary exam normal breath sounds clear to auscultation       Cardiovascular Exercise Tolerance: Good hypertension, Pt. on medications Normal cardiovascular exam Rhythm:Regular Rate:Normal     Neuro/Psych  PSYCHIATRIC DISORDERS Anxiety Depression     Neuromuscular disease  negative psych ROS   GI/Hepatic PUD,GERD  Medicated and Controlled,,(+) Hepatitis -, ARemote hepatitis    Endo/Other    Class 3 obesity  Renal/GU negative Renal ROS  negative genitourinary   Musculoskeletal  (+) Arthritis , Osteoarthritis,  OA right knee Hx/o liposarcoma   Abdominal  (+) + obese  Peds negative pediatric ROS (+)  Hematology negative hematology ROS (+)   Anesthesia Other Findings   Reproductive/Obstetrics negative OB ROS                              Anesthesia Physical Anesthesia Plan  ASA: 3  Anesthesia Plan: MAC, Regional and Spinal   Post-op Pain Management: Tylenol  PO (pre-op)*   Induction: Intravenous  PONV Risk Score and Plan: 4 or greater and Ondansetron , Treatment may vary due to age or medical condition, TIVA and Propofol  infusion  Airway Management Planned: Natural Airway and Simple Face Mask  Additional Equipment: None  Intra-op Plan:   Post-operative Plan:   Informed Consent: I have reviewed the patients History and Physical, chart, labs and discussed the procedure including the  risks, benefits and alternatives for the proposed anesthesia with the patient or authorized representative who has indicated his/her understanding and acceptance.     Dental advisory given  Plan Discussed with: Anesthesiologist and CRNA  Anesthesia Plan Comments: (See PAT visit on 3/27   DISCUSSION: Maria Wang is a 73 yo female who presents to PAT prior to surgery above. PMH of former smoking, HTN, OSA (no CPAP use), GERD, remote hx of Hep A, breast cancer s/p right breast lumpectomy (2015), arthritis, anxiety, depression.   Prior anesthesia complication includes PONV, prolonged emergence   Last seen by PCP on 10/03/23. BP noted to be not well controlled. Her Amlodipine  dose was increased. The patient kept a log of her BP readings and it was better controlled and has continued on the higher dose. Otherwise stable at that visit.   )         Anesthesia Quick Evaluation

## 2024-03-13 NOTE — Discharge Instructions (Signed)

## 2024-03-13 NOTE — Anesthesia Procedure Notes (Signed)
 Procedure Name: MAC Date/Time: 03/13/2024 11:55 AM  Performed by: Nada Corean CROME, CRNAPre-anesthesia Checklist: Patient identified, Suction available, Emergency Drugs available, Patient being monitored and Timeout performed Patient Re-evaluated:Patient Re-evaluated prior to induction Oxygen Delivery Method: Simple face mask Preoxygenation: Pre-oxygenation with 100% oxygen Induction Type: IV induction Ventilation: Oral airway inserted - appropriate to patient size Placement Confirmation: positive ETCO2 Dental Injury: Teeth and Oropharynx as per pre-operative assessment

## 2024-03-13 NOTE — Anesthesia Postprocedure Evaluation (Signed)
 Anesthesia Post Note  Patient: Maria Wang  Procedure(s) Performed: ARTHROPLASTY, KNEE, TOTAL (Right: Knee)     Patient location during evaluation: PACU Anesthesia Type: Spinal and MAC Level of consciousness: oriented and awake and alert Pain management: pain level controlled Vital Signs Assessment: post-procedure vital signs reviewed and stable Respiratory status: spontaneous breathing, respiratory function stable and nonlabored ventilation Cardiovascular status: blood pressure returned to baseline and stable Postop Assessment: no headache, no backache, no apparent nausea or vomiting and spinal receding Anesthetic complications: no   No notable events documented.  Last Vitals:  Vitals:   03/13/24 1445 03/13/24 1500  BP: 114/65 122/67  Pulse: (!) 59 (!) 58  Resp: (!) 22 15  Temp:    SpO2: 92% 94%    Last Pain:  Vitals:   03/13/24 1500  TempSrc:   PainSc: 0-No pain      LLE Sensation: Tingling (03/13/24 1500) RLE Motor Response: Purposeful movement (03/13/24 1500) RLE Sensation: Tingling (03/13/24 1500)      [name] A.

## 2024-03-13 NOTE — Op Note (Signed)
 DATE OF SURGERY:  03/13/2024 TIME: 2:28 PM  PATIENT NAME:  Maria Wang   AGE: 73 y.o.    PRE-OPERATIVE DIAGNOSIS: Right knee primary localized osteoarthritis  POST-OPERATIVE DIAGNOSIS:  Same  PROCEDURE: Right total Knee Arthroplasty  SURGEON:  Fonda SHAUNNA Olmsted, MD   ASSISTANT:  Army Daring, PA-C, present and scrubbed throughout the case, critical for assistance with exposure, retraction, instrumentation, and closure.  OPERATIVE IMPLANTS: Implant Name: CEMENT HV SMART SET - [id number] Type: Cement Inv. Item: CEMENT HV SMART SET Serial No.:  Manufacturer: [employer] Lot No.: [id number] LRB: Right No. Used: 2 Action: Implanted   Implant Name: BASEPLATE TIB CMT FB PCKT SZ4 - [id number] Type: Stem Inv. Item: BASEPLATE TIB CMT FB PCKT SZ4 Serial No.:  Manufacturer: [employer] Lot No.: [id number] LRB: Right No. Used: 1 Action: Implanted   Implant Name: ATTUNE PS FEM RT SZ 4 CEM KNEE - [id number] Type: Femur Inv. Item: ATTUNE PS FEM RT SZ 4 CEM KNEE Serial No.:  Manufacturer: [employer] Lot No.: [id number] LRB: Right No. Used: 1 Action: Implanted   Implant Name: PATELLA MEDIAL ATTUN KNEE - [id number] Type: Knees Inv. Item: PATELLA MEDIAL ATTUN KNEE Serial No.:  Manufacturer: [employer] Lot No.: [id number] LRB: Right No. Used: 1 Action: Implanted   Implant Name: INSERT TIB ATTUNE FB [id number] - [id number] Type: Insert Inv. Item: INSERT TIB ATTUNE FB [id number] Serial No.:  Manufacturer: [employer] Lot No.: [id number] LRB: Right No. Used: 1 Action: Implanted   PREOPERATIVE INDICATIONS:  Maria Wang is a 73 y.o. year old female with end stage bone on bone degenerative arthritis of the knee who failed conservative treatment, including injections, antiinflammatories, activity modification, and assistive devices, and had significant impairment of their activities of daily living, and elected for Total Knee  Arthroplasty.   The risks, benefits, and alternatives were discussed at length including but not limited to the risks of infection, bleeding, nerve injury, stiffness, blood clots, the need for revision surgery, cardiopulmonary complications, among others, and they were willing to proceed.  OPERATIVE FINDINGS AND UNIQUE ASPECTS OF THE CASE: Similar to the other side, the landmarks were not particularly easy to identify given her body habitus.  I did however have excellent tracking at the completion of the case.  I cut the femur on 10 in the tibia on 3.  I did not have to cut the tibia or the femur a second time.  ESTIMATED BLOOD LOSS: 150 mL  OPERATIVE DESCRIPTION:  The patient was brought to the operative room and placed in a supine position.  Anesthesia was administered.  IV antibiotics were given.  The lower extremity was prepped and draped in the usual sterile fashion.  Time out was performed.  The leg was elevated and exsanguinated and the tourniquet was inflated.  Anterior quadriceps tendon splitting approach was performed.  The patella was everted and osteophytes were removed.  The anterior horn of the medial and lateral meniscus was removed.   The patella was then measured, and cut with the saw.  The thickness before the cut was 20 and after the cut was 14.  A metal shield was used to protect the patella throughout the case.    The distal femur was opened with the drill and the intramedullary distal femoral cutting jig was utilized, set at 5 degrees resecting 10 mm off the distal femur.  Care was taken to protect the collateral ligaments.  Then the extramedullary tibial cutting jig was utilized making  the appropriate cut using the anterior tibial crest as a reference building in appropriate posterior slope.  Care was taken during the cut to protect the medial and collateral ligaments.  The proximal tibia was removed along with the posterior horns of the menisci.  The PCL was sacrificed.     The extensor gap was measured and found to have adequate resection, measuring to a size 6, or maybe a 7.    The distal femoral sizing jig was applied, taking care to avoid notching.  This was set at 3 degrees of external rotation.  Then the 4-in-1 cutting jig was applied and the anterior and posterior femur was cut, along with the chamfer cuts.  All posterior osteophytes were removed.  The flexion gap was then measured and was symmetric with the extension gap.  I completed the distal femoral preparation using the appropriate jig to prepare the box.  The proximal tibia sized and prepared accordingly with the reamer and the punch, and then all components were trialed with the poly insert.  The knee was found to have excellent balance and full motion.    The above named components were then cemented into place and all excess cement was removed.  The real polyethylene implant was placed.  After the cement had cured I released the tourniquet and confirmed excellent hemostasis with no major posterior vessel injury.    The knee was easily taken through a range of motion and the patella tracked well and the knee irrigated copiously and the parapatellar tissue closed with Stratafix and vicryl, and subcutaneous tissue closed with vicryl, and monocryl with steri strips for the skin.  The wounds were injected with marcaine , and dressed with sterile gauze and the patient was awakened and returned to the PACU in stable and satisfactory condition.  There were no complications.  Total tourniquet time was approximately 70 minutes.

## 2024-03-13 NOTE — Plan of Care (Signed)
  Problem: Activity: Goal: Risk for activity intolerance will decrease Outcome: Progressing   Problem: Safety: Goal: Ability to remain free from injury will improve Outcome: Progressing   Problem: Pain Managment: Goal: General experience of comfort will improve and/or be controlled Outcome: Progressing

## 2024-03-13 NOTE — Interval H&P Note (Signed)
 History and Physical Interval Note:  03/13/2024 11:31 AM  Maria Wang  has presented today for surgery, with the diagnosis of Primary osteoarthritis of right knee.  The various methods of treatment have been discussed with the patient and family. After consideration of risks, benefits and other options for treatment, the patient has consented to  Procedure(s): ARTHROPLASTY, KNEE, TOTAL (Right) as a surgical intervention.  The patient's history has been reviewed, patient examined, no change in status, stable for surgery.  I have reviewed the patient's chart and labs.  Questions were answered to the patient's satisfaction.     Fonda SHAUNNA Olmsted

## 2024-03-13 NOTE — Evaluation (Signed)
 Physical Therapy Evaluation Patient Details Name: Maria Wang MRN: [id number] DOB: 1950/08/05 Today's Date: 03/13/2024  History of Present Illness  73 yo female presents to therapy s/p R TKA on 03/13/2024 due to failure of conservative measures. Pt PMH includes but is not limited to: allergies, anxiety, arthritis, R ba ca s/p lumpectomy, depression, Hep A, kidney stones, HTN, OSA, thyroid  dz, cholecystectomy, small bowel resection and L TKA (11/08/2023).  Clinical Impression     [name] is a 73 y.o. female POD 0 s/p R TKA. Patient reports IND with mobility at baseline. Patient is now limited by functional impairments (see PT problem list below) and requires CGA for supine to sit and min A for sit to supine for bed mobility and CGA and cues for transfers. Patient was able to ambulate 3 feet with RW and CGA side stepping to the L. Patient instructed in exercise to facilitate ROM and circulation to manage edema. Patient will benefit from continued skilled PT interventions to address impairments and progress towards PLOF. Acute PT will follow to progress mobility and stair training in preparation for safe discharge home with family support and [facility] services. Pt indicated feeling apprehensive about same day eval due to issues with Bp for L TKA 11/2023,  however agreeable to standing EOB if able. PT assessed Bp as below. With pt noted to report slight dizziness with standing. Bp semi reclined in bed 140/70 (55 PR) Bp seated EOB 139/73 (59 PR) Bp standing 120/78 (65 PR) indicative of orthostatic hypotension.        If plan is discharge home, recommend the following: A little help with walking and/or transfers;A little help with bathing/dressing/bathroom;Assistance with cooking/housework;Assist for transportation;Help with stairs or ramp for entrance   Can travel by private vehicle        Equipment Recommendations None recommended by PT  Recommendations for Other Services        Functional Status Assessment Patient has had a recent decline in their functional status and demonstrates the ability to make significant improvements in function in a reasonable and predictable amount of time.     Precautions / Restrictions Precautions Precautions: Knee;Fall (monitor Bp) Restrictions Weight Bearing Restrictions Per Provider Order: No      Mobility  Bed Mobility Overal bed mobility: Needs Assistance Bed Mobility: Supine to Sit, Sit to Supine     Supine to sit: Contact guard, HOB elevated Sit to supine: Min assist   General bed mobility comments: cues and increased time with use of hospital bed for supine to sit, bed flat for sit to supine and min A for R LE    Transfers Overall transfer level: Needs assistance Equipment used: Rolling walker (2 wheels) Transfers: Sit to/from Stand Sit to Stand: Contact guard assist           General transfer comment: min cues    Ambulation/Gait Ambulation/Gait assistance: Contact guard assist Gait Distance (Feet): 3 Feet Assistive device: Rolling walker (2 wheels) Gait Pattern/deviations: Step-to pattern Gait velocity: decreased     General Gait Details: side step to the L toward [facility] with min cues  Stairs            Wheelchair Mobility     Tilt Bed    Modified Rankin (Stroke Patients Only)       Balance Overall balance assessment: Needs assistance, History of Falls (recent fall (8/10) secondary to lightheadedness/standing up too fast and missed EOB) Sitting-balance support: Feet supported Sitting balance-Leahy Scale: Good  Standing balance support: Bilateral upper extremity supported, During functional activity, Reliant on assistive device for balance Standing balance-Leahy Scale: Poor                               Pertinent Vitals/Pain Pain Assessment Pain Assessment: 0-10 Pain Score: 3  Pain Location: R knee and LE Pain Descriptors / Indicators: Aching, Discomfort, Dull,  Operative site guarding (just starting to feel it) Pain Intervention(s): Limited activity within patient's tolerance, Monitored during session, Premedicated before session, Repositioned, Ice applied    Home Living Family/patient expects to be discharged to:: Private residence Living Arrangements: Spouse/significant other Available Help at Discharge: Family;Available 24 hours/day Type of Home: House Home Access: Stairs to enter Entrance Stairs-Rails: None Entrance Stairs-Number of Steps: 1 Alternate Level Stairs-Number of Steps: 3 Home Layout: Multi-level Home Equipment: Pharmacist, hospital (2 wheels);Rollator (4 wheels);BSC/3in1      Prior Function Prior Level of Function : Independent/Modified Independent             Mobility Comments: IND no AD for all ADLs, self care tasks       Extremity/Trunk Assessment        Lower Extremity Assessment Lower Extremity Assessment: RLE deficits/detail RLE Deficits / Details: ankle DF/PF 5/5; SLR < 10 degree lag RLE Sensation: WNL    Cervical / Trunk Assessment Cervical / Trunk Assessment: Normal  Communication   Communication Communication: No apparent difficulties    Cognition Arousal: Alert Behavior During Therapy: WFL for tasks assessed/performed   PT - Cognitive impairments: No apparent impairments                         Following commands: Intact       Cueing       General Comments      Exercises Total Joint Exercises Ankle Circles/Pumps: AROM, Both, 10 reps   Assessment/Plan    PT Assessment Patient needs continued PT services  PT Problem List Decreased strength;Decreased range of motion;Decreased activity tolerance;Decreased balance;Decreased mobility;Decreased coordination;Pain       PT Treatment Interventions DME instruction;Gait training;Stair training;Functional mobility training;Therapeutic activities;Therapeutic exercise;Balance training;Neuromuscular re-education;Patient/family  education;Modalities    PT Goals (Current goals can be found in the Care Plan section)  Acute Rehab PT Goals Patient Stated Goal: to be able to go up the steps with both LEs PT Goal Formulation: With patient Time For Goal Achievement: 03/27/24 Potential to Achieve Goals: Good    Frequency 7X/week     Co-evaluation               AM-PAC PT 6 Clicks Mobility  Outcome Measure Help needed turning from your back to your side while in a flat bed without using bedrails?: A Little Help needed moving from lying on your back to sitting on the side of a flat bed without using bedrails?: A Little Help needed moving to and from a bed to a chair (including a wheelchair)?: A Little Help needed standing up from a chair using your arms (e.g., wheelchair or bedside chair)?: A Little Help needed to walk in hospital room?: A Little Help needed climbing 3-5 steps with a railing? : Total 6 Click Score: 16    End of Session Equipment Utilized During Treatment: Gait belt Activity Tolerance: Patient tolerated treatment well Patient left: in bed;with call bell/phone within reach;with nursing/sitter in room Nurse Communication: Mobility status PT Visit Diagnosis: Unsteadiness on feet (R26.81);Other abnormalities  of gait and mobility (R26.89);Muscle weakness (generalized) (M62.81);History of falling (Z91.81);Difficulty in walking, not elsewhere classified (R26.2);Pain Pain - Right/Left: Right Pain - part of body: Knee;Leg    Time: 8173-8152 PT Time Calculation (min) (ACUTE ONLY): 21 min   Charges:   PT Evaluation $PT Eval Low Complexity: 1 Low   PT General Charges $$ ACUTE PT VISIT: 1 Visit         [place], PT Acute Rehab   [place] VEAR Drone 03/13/2024, 7:02 PM

## 2024-03-13 NOTE — Transfer of Care (Signed)
 Immediate Anesthesia Transfer of Care Note  Patient: Maria Wang  Procedure(s) Performed: ARTHROPLASTY, KNEE, TOTAL (Right: Knee)  Patient Location: PACU  Anesthesia Type:MAC, Regional, and Spinal  Level of Consciousness: oriented, drowsy, patient cooperative, and responds to stimulation  Airway & Oxygen Therapy: Patient Spontanous Breathing and Patient connected to face mask oxygen  Post-op Assessment: Report given to RN and Post -op Vital signs reviewed and stable  Post vital signs: Reviewed and stable  Last Vitals:  Vitals Value Taken Time  BP 111/65 03/13/24 14:19  Temp    Pulse 66 03/13/24 14:21  Resp 31 03/13/24 14:21  SpO2 95 % 03/13/24 14:21  Vitals shown include unfiled device data.  Last Pain:  Vitals:   03/13/24 0858  TempSrc: Oral         Complications: No notable events documented.

## 2024-03-13 NOTE — Anesthesia Procedure Notes (Signed)
 Spinal  Patient location during procedure: OR Start time: 03/13/2024 11:45 AM End time: 03/13/2024 11:47 AM Reason for block: surgical anesthesia Staffing Anesthesiologist: Jerrye Sharper, MD Performed by: Jerrye Sharper, MD Authorized by: Jerrye Sharper, MD   Preanesthetic Checklist Completed: patient identified, IV checked, site marked, risks and benefits discussed, surgical consent, monitors and equipment checked, pre-op evaluation and timeout performed Spinal Block Patient position: sitting Prep: DuraPrep and site prepped and draped Patient monitoring: heart rate, cardiac monitor, continuous pulse ox and blood pressure Approach: midline Location: L3-4 Injection technique: single-shot Needle Needle type: Pencan  Needle gauge: 24 G Needle length: 9 cm Needle insertion depth: 7 cm Assessment Sensory level: T6 Events: CSF return Additional Notes Patient tolerated procedure well. Adequate sensory level.

## 2024-03-13 NOTE — Anesthesia Procedure Notes (Signed)
 Anesthesia Regional Block: Adductor canal block   Pre-Anesthetic Checklist: , timeout performed,  Correct Patient, Correct Site, Correct Laterality,  Correct Procedure, Correct Position, site marked,  Risks and benefits discussed,  Surgical consent,  Pre-op evaluation,  At surgeon's request and post-op pain management  Laterality: Right  Prep: chloraprep       Needles:  Injection technique: Single-shot  Needle Type: Echogenic Stimulator Needle     Needle Length: 10cm  Needle Gauge: 21   Needle insertion depth: 7 cm   Additional Needles:   Procedures:,,,, ultrasound used (permanent image in chart),,    Narrative:  Start time: 03/13/2024 10:50 AM End time: 03/13/2024 10:55 AM Injection made incrementally with aspirations every 5 mL.  Performed by: Personally  Anesthesiologist: Jerrye Sharper, MD  Additional Notes: Timeout performed. Patient sedated. Relevant anatomy ID'd using US . Incremental 2-5ml injection of LA with frequent aspiration. Patient tolerated procedure well.

## 2024-03-14 ENCOUNTER — Encounter (HOSPITAL_COMMUNITY): Payer: Self-pay | Admitting: Orthopedic Surgery

## 2024-03-14 DIAGNOSIS — M1711 Unilateral primary osteoarthritis, right knee: Secondary | ICD-10-CM | POA: Diagnosis not present

## 2024-03-14 LAB — CBC
HCT: 38.1 % (ref 36.0–46.0)
Hemoglobin: 12.2 g/dL (ref 12.0–15.0)
MCH: 30.8 pg (ref 26.0–34.0)
MCHC: 32 g/dL (ref 30.0–36.0)
MCV: 96.2 fL (ref 80.0–100.0)
Platelets: 202 K/uL (ref 150–400)
RBC: 3.96 MIL/uL (ref 3.87–5.11)
RDW: 13.8 % (ref 11.5–15.5)
WBC: 10.9 K/uL — ABNORMAL HIGH (ref 4.0–10.5)
nRBC: 0 % (ref 0.0–0.2)

## 2024-03-14 LAB — BASIC METABOLIC PANEL WITH GFR
Anion gap: 7 (ref 5–15)
BUN: 18 mg/dL (ref 8–23)
CO2: 25 mmol/L (ref 22–32)
Calcium: 8.3 mg/dL — ABNORMAL LOW (ref 8.9–10.3)
Chloride: 104 mmol/L (ref 98–111)
Creatinine, Ser: 0.69 mg/dL (ref 0.44–1.00)
GFR, Estimated: >60 mL/min (ref >60)
Glucose, Bld: 125 mg/dL — ABNORMAL HIGH (ref 70–99)
Potassium: 4.7 mmol/L (ref 3.5–5.1)
Sodium: 136 mmol/L (ref 135–145)

## 2024-03-14 MED ORDER — SENNA-DOCUSATE SODIUM 8.6-50 MG PO TABS: 2.0000 | ORAL_TABLET | Freq: Every day | ORAL | 1 refills | Status: DC

## 2024-03-14 MED ORDER — ONDANSETRON HCL 4 MG PO TABS: 4.0000 mg | ORAL_TABLET | Freq: Three times a day (TID) | ORAL | 0 refills | Status: DC | PRN

## 2024-03-14 MED ORDER — ASPIRIN 325 MG PO TBEC: 325.0000 mg | DELAYED_RELEASE_TABLET | Freq: Two times a day (BID) | ORAL | 0 refills | Status: AC

## 2024-03-14 MED ORDER — OXYCODONE HCL 10 MG PO TABS: 10.0000 mg | ORAL_TABLET | ORAL | 0 refills | Status: DC | PRN

## 2024-03-14 MED ORDER — BACLOFEN 10 MG PO TABS: 10.0000 mg | ORAL_TABLET | Freq: Three times a day (TID) | ORAL | 0 refills | Status: AC

## 2024-03-14 NOTE — Progress Notes (Signed)
 Physical Therapy Treatment Patient Details Name: Maria Wang MRN: [id number] DOB: Mar 29, 1951 Today's Date: 03/14/2024   History of Present Illness 73 yo female presents to therapy s/p R TKA on 03/13/2024 due to failure of conservative measures. Pt PMH includes but is not limited to: allergies, anxiety, arthritis, R ba ca s/p lumpectomy, depression, Hep A, kidney stones, HTN, OSA, thyroid  dz, cholecystectomy, small bowel resection and L TKA (11/08/2023).    PT Comments  POD # 1 pm session Pt still in recliner doing well.  Assisted out of recliner to amb to bathroom.  Pt able to self rise and lower safely from toilet as well as perform self peri care.  Assisted with amb in hallway then practiced the stairs.  General stair comments: Pt has 3 small steps NO rail to enter her home.  Instructed and practiced up forward and downward with walker.  Educated to have Spouse secure walker and to  not perform on your own.  Pt stated, This knee surgery is going so much better than my last.  Addressed all mobility questions, has HEP,  discussed appropriate activity, educated on use of ICE.  Pt ready for D/C to home.    If plan is discharge home, recommend the following: A little help with walking and/or transfers;A little help with bathing/dressing/bathroom;Assistance with cooking/housework;Assist for transportation;Help with stairs or ramp for entrance   Can travel by private vehicle        Equipment Recommendations  None recommended by PT    Recommendations for Other Services       Precautions / Restrictions Precautions Precautions: Knee;Fall Precaution/Restrictions Comments: no pillow under knee Restrictions Weight Bearing Restrictions Per Provider Order: No     Mobility  Bed Mobility   General bed mobility comments: OOB in recliner    Transfers Overall transfer level: Needs assistance Equipment used: Rolling walker (2 wheels) Transfers: Sit to/from Stand Sit to Stand:  Supervision           General transfer comment: progressing well with good use of hands to steady self.  Also assisted with a toilet transfer.  Pt was self able to rise and lower as well as perform self peri care.    Ambulation/Gait Ambulation/Gait assistance: Supervision Gait Distance (Feet): 36 Feet Assistive device: Rolling walker (2 wheels) Gait Pattern/deviations: Step-to pattern Gait velocity: decreased     General Gait Details: tolerated an increased distance.  Stated this surgery is going better than the last.   Stairs Stairs: Yes Stairs assistance: Contact guard assist, Supervision Stair Management: No rails, Step to pattern, Forwards Number of Stairs: 3 General stair comments: Pt has 3 small steps NO rail to enter her home.  Instructed and practiced up forward and downward with walker.  Educated to have Spouse secure walker and to  not perform on your own.   Wheelchair Mobility     Tilt Bed    Modified Rankin (Stroke Patients Only)       Balance                                            Communication Communication Communication: No apparent difficulties  Cognition Arousal: Alert Behavior During Therapy: WFL for tasks assessed/performed   PT - Cognitive impairments: No apparent impairments  PT - Cognition Comments: AxO x 3 pleasant and motivated.  Lives home with Spouse and this is her second TKR (L TKR March 2025). Following commands: Intact      Cueing Cueing Techniques: Verbal cues  Exercises      General Comments        Pertinent Vitals/Pain Pain Assessment Pain Assessment: 0-10 Pain Score: 5  Pain Location: R knee and LE Pain Descriptors / Indicators: Aching, Discomfort, Dull, Operative site guarding Pain Intervention(s): Monitored during session, Premedicated before session, Repositioned, Ice applied    Home Living                          Prior Function             PT Goals (current goals can now be found in the care plan section) Progress towards PT goals: Progressing toward goals    Frequency    7X/week      PT Plan      Co-evaluation              AM-PAC PT 6 Clicks Mobility   Outcome Measure  Help needed turning from your back to your side while in a flat bed without using bedrails?: None Help needed moving from lying on your back to sitting on the side of a flat bed without using bedrails?: None Help needed moving to and from a bed to a chair (including a wheelchair)?: None Help needed standing up from a chair using your arms (e.g., wheelchair or bedside chair)?: None Help needed to walk in hospital room?: None Help needed climbing 3-5 steps with a railing? : A Little 6 Click Score: 23    End of Session Equipment Utilized During Treatment: Gait belt Activity Tolerance: Patient tolerated treatment well Patient left: in chair;with call bell/phone within reach Nurse Communication: Mobility status PT Visit Diagnosis: Unsteadiness on feet (R26.81);Other abnormalities of gait and mobility (R26.89);Muscle weakness (generalized) (M62.81);History of falling (Z91.81);Difficulty in walking, not elsewhere classified (R26.2);Pain Pain - Right/Left: Right Pain - part of body: Knee;Leg     Time: 8553-8487 PT Time Calculation (min) (ACUTE ONLY): 26 min  Charges:    $Gait Training: 8-22 mins $Therapeutic Activity: 8-22 mins PT General Charges $$ ACUTE PT VISIT: 1 Visit                     Katheryn Leap  PTA Acute  Rehabilitation Services Office M-F          [phone]

## 2024-03-14 NOTE — Progress Notes (Signed)
 Physical Therapy Treatment Patient Details Name: Maria Wang MRN: [id number] DOB: 1951/07/11 Today's Date: 03/14/2024   History of Present Illness 73 yo female presents to therapy s/p R TKA on 03/13/2024 due to failure of conservative measures. Pt PMH includes but is not limited to: allergies, anxiety, arthritis, R ba ca s/p lumpectomy, depression, Hep A, kidney stones, HTN, OSA, thyroid  dz, cholecystectomy, small bowel resection and L TKA (11/08/2023).    PT Comments  POD # 1 am session PT - Cognition Comments: AxO x 3 pleasant and motivated.  Lives home with Spouse and this is her second TKR (L TKR March 2025). Assisted OOB to amb in hallway with improvement.  Reports of mild dizziness so took vitals: Supine      BP 133/63(84)  HR 61 EOB         BP 134/80(97)  HR 58 Standing  BP 133/72(88)  HR 68 Suspect dizziness from OXY but Pt steady with no overt risk for falls.   Pt reports she took OXY last time and it does that but I need it. General Gait Details: Assisted with amb in hallway an increased distance 28 feet and 50% VC's to advance right leg first as Pt was in the habit of LEFT first from recent knee surgery. Then returned to room to perform some TE's following HEP handout.  Instructed on proper tech, freq as well as use of ICE.   Pt will have a PM PT session to complete HEP Education as well as practice stairs.    If plan is discharge home, recommend the following: A little help with walking and/or transfers;A little help with bathing/dressing/bathroom;Assistance with cooking/housework;Assist for transportation;Help with stairs or ramp for entrance   Can travel by private vehicle        Equipment Recommendations  None recommended by PT    Recommendations for Other Services       Precautions / Restrictions Precautions Precautions: Knee;Fall Precaution/Restrictions Comments: no pillow under knee Restrictions Weight Bearing Restrictions Per Provider Order: No      Mobility  Bed Mobility Overal bed mobility: Needs Assistance Bed Mobility: Supine to Sit     Supine to sit: Supervision     General bed mobility comments: demonstarted and Educated how to use her strap to self guide LE off bed.  Pt self able to transition to EOB.  Mild c/o dizziness.  BP take and allowed increased rest breaks between each position change.  Pain well controlled reporting 3/10.    Transfers Overall transfer level: Needs assistance Equipment used: Rolling walker (2 wheels) Transfers: Sit to/from Stand Sit to Stand: Supervision, Contact guard assist           General transfer comment: <25% VC's on proper hand placement and to advance R LE prior to sit.  Mils c/o dizziness ut vitals BP WNL.  Suspect OXY.  Pt stated, it does that to me but I'm fine. No overt LOB.  Pt is steady.    Ambulation/Gait Ambulation/Gait assistance: Supervision, Contact guard assist Gait Distance (Feet): 28 Feet Assistive device: Rolling walker (2 wheels) Gait Pattern/deviations: Step-to pattern Gait velocity: decreased     General Gait Details: Assisted with amb in hallway an increased distance and 50% VC's to advance right leg first as Pt was in the habit of LEFT first from recent knee surgery.   Stairs             Wheelchair Mobility     Tilt Bed    Modified Rankin (  Stroke Patients Only)       Balance                                            Communication Communication Communication: No apparent difficulties  Cognition Arousal: Alert Behavior During Therapy: WFL for tasks assessed/performed   PT - Cognitive impairments: No apparent impairments                       PT - Cognition Comments: AxO x 3 pleasant and motivated.  Lives home with Spouse and this is her second TKR (L TKR March 2025). Following commands: Intact      Cueing Cueing Techniques: Verbal cues  Exercises  Total Knee Replacement TE's following HEP  handout 10 reps B LE ankle pumps 05 reps towel squeezes 05 reps knee presses 05 reps heel slides  ` Educated on use of gait belt to assist with TE's Followed by ICE     General Comments        Pertinent Vitals/Pain Pain Assessment Pain Assessment: 0-10 Pain Score: 3  Pain Location: R knee and LE Pain Descriptors / Indicators: Aching, Discomfort, Dull, Operative site guarding Pain Intervention(s): Monitored during session, Premedicated before session, Repositioned, Ice applied    Home Living                          Prior Function            PT Goals (current goals can now be found in the care plan section) Progress towards PT goals: Progressing toward goals    Frequency    7X/week      PT Plan      Co-evaluation              AM-PAC PT 6 Clicks Mobility   Outcome Measure  Help needed turning from your back to your side while in a flat bed without using bedrails?: None Help needed moving from lying on your back to sitting on the side of a flat bed without using bedrails?: None Help needed moving to and from a bed to a chair (including a wheelchair)?: None Help needed standing up from a chair using your arms (e.g., wheelchair or bedside chair)?: None Help needed to walk in hospital room?: A Little Help needed climbing 3-5 steps with a railing? : A Lot 6 Click Score: 21    End of Session Equipment Utilized During Treatment: Gait belt Activity Tolerance: Patient tolerated treatment well Patient left: in chair;with call bell/phone within reach Nurse Communication: Mobility status PT Visit Diagnosis: Unsteadiness on feet (R26.81);Other abnormalities of gait and mobility (R26.89);Muscle weakness (generalized) (M62.81);History of falling (Z91.81);Difficulty in walking, not elsewhere classified (R26.2);Pain Pain - Right/Left: Right Pain - part of body: Knee;Leg     Time: 0921-0956 PT Time Calculation (min) (ACUTE ONLY): 35 min  Charges:     $Gait Training: 8-22 mins $Therapeutic Exercise: 8-22 mins PT General Charges $$ ACUTE PT VISIT: 1 Visit                     Katheryn Leap  PTA Acute  Rehabilitation Services Office M-F          [phone]

## 2024-03-14 NOTE — Discharge Summary (Signed)
 Discharge Summary  Patient ID: Maria Wang MRN: [id number] DOB/AGE: [birth year] 73 y.o.  Admit date: 03/13/2024 Discharge date: 03/14/2024  Admission Diagnoses:  S/P TKR (total knee replacement), right  Discharge Diagnoses:  Principal Problem:   S/P TKR (total knee replacement), right   Past Medical History:  Diagnosis Date   Allergy    Anxiety    Arthritis    Cancer (HCC)    right breast lumpectomy   Cataract    Complication of anesthesia    slow to wake up   Depression    Hepatitis    prb A   History of kidney stones    Hypertension    PONV (postoperative nausea and vomiting)    Sleep apnea    no longr uses cpap   Thyroid  disease     Surgeries: Procedure(s): ARTHROPLASTY, KNEE, TOTAL on 03/13/2024   Consultants (if any):   Discharged Condition: Improved  Hospital Course: Wilbert Schouten is an 73 y.o. female who was admitted 03/13/2024 with a diagnosis of S/P TKR (total knee replacement), right and went to the operating room on 03/13/2024 and underwent the above named procedures.    Today's  total administered Morphine  Milligram Equivalents: 37.5 Yesterday's total administered Morphine  Milligram Equivalents: 22.5  She was given perioperative antibiotics:  Anti-infectives (From admission, onward)    Start     Dose/Rate Route Frequency Ordered Stop   03/13/24 0845  ceFAZolin  (ANCEF ) IVPB 2g/100 mL premix        2 g 200 mL/hr over 30 Minutes Intravenous On call to O.R. 03/13/24 9161 03/13/24 1212     .  She was given sequential compression devices, early ambulation, and aspirin  for DVT prophylaxis.  She benefited maximally from the hospital stay and there were no complications.    Recent vital signs:  Vitals:   03/14/24 1018 03/14/24 1316  BP: 137/63 127/64  Pulse: (!) 54 61  Resp: 17 17  Temp: 98.7 F (37.1 C) 98.6 F (37 C)  SpO2: 96% 99%    Recent laboratory studies:  Lab Results  Component Value Date   HGB 12.2 03/14/2024   HGB 14.2  02/29/2024   HGB 11.4 (L) 11/10/2023   Lab Results  Component Value Date   WBC 10.9 (H) 03/14/2024   PLT 202 03/14/2024   No results found for: INR Lab Results  Component Value Date   NA 136 03/14/2024   K 4.7 03/14/2024   CL 104 03/14/2024   CO2 25 03/14/2024   BUN 18 03/14/2024   CREATININE 0.69 03/14/2024   GLUCOSE 125 (H) 03/14/2024    Discharge Medications:   Allergies as of 03/14/2024       Reactions   Penicillin G Hives   Other Reaction(s): hives as child Tolerated Ancef  11/09/23   Tetanus Immune Globulin Swelling   Other Reaction(s): severe swelling at site, upper arm   Tetanus Toxoids Swelling   Arm swelled up        Medication List     TAKE these medications    alendronate  70 MG tablet Commonly known as: FOSAMAX  TAKE 1 TABLET BY MOUTH ONCE WEEKLY.   amLODipine  10 MG tablet Commonly known as: NORVASC  TAKE 1 TABLET BY MOUTH ONCE DAILY   aspirin  EC 325 MG tablet Take 1 tablet (325 mg total) by mouth 2 (two) times daily.   baclofen  10 MG tablet Commonly known as: LIORESAL  Take 1 tablet (10 mg total) by mouth 3 (three) times daily. As needed for muscle spasm  buPROPion  300 MG 24 hr tablet Commonly known as: WELLBUTRIN  XL Take 1 tablet (300 mg total) by mouth every morning.   CALCIUM  600+D3 PO Take 1 tablet by mouth in the morning and at bedtime.   cholecalciferol  25 MCG (1000 UNIT) tablet Commonly known as: VITAMIN D3 Take 1,000 Units by mouth in the morning.   Coenzyme Q10 300 MG Caps Take 300 mg by mouth in the morning.   COSAMIN DS PO Take 2 tablets by mouth in the morning.   dicyclomine  10 MG capsule Commonly known as: BENTYL  Take 10 mg by mouth 4 (four) times daily as needed for spasms.   Fish Oil 1000 MG Caps Take 1,000 mg by mouth in the morning.   FLUoxetine  20 MG capsule Commonly known as: PROZAC  Take 1 capsule (20 mg total) by mouth daily.   gabapentin  600 MG tablet Commonly known as: NEURONTIN  Take 1 tablet (600 mg  total) by mouth 3 (three) times daily.   losartan  100 MG tablet Commonly known as: COZAAR  Take 1 tablet (100 mg total) by mouth in the morning.   Lubricant Eye Drops 0.4-0.3 % Soln Generic drug: Polyethyl Glycol-Propyl Glycol Place 1-2 drops into both eyes 3 (three) times daily as needed (dry/irritated eyes.).   MACULAR HEALTH FORMULA PO Take 1 capsule by mouth in the morning and at bedtime.   MELATONIN PO Take 1 tablet by mouth at bedtime as needed (sleep).   multivitamin with minerals Tabs tablet Take 1 tablet by mouth in the morning. One A Day for Women 50+   naproxen  sodium 220 MG tablet Commonly known as: ALEVE  Take 440 mg by mouth 2 (two) times daily as needed (pain.).   ondansetron  4 MG tablet Commonly known as: Zofran  Take 1 tablet (4 mg total) by mouth every 8 (eight) hours as needed for nausea or vomiting.   oxybutynin  10 MG 24 hr tablet Commonly known as: DITROPAN -XL TAKE 1 TABLET BY MOUTH AT BEDTIME   Oxycodone  HCl 10 MG Tabs Take 1-1.5 tablets (10-15 mg total) by mouth every 4 (four) hours as needed (severe pain).   sennosides-docusate sodium  8.6-50 MG tablet Commonly known as: SENOKOT-S Take 2 tablets by mouth daily.   triamcinolone  cream 0.1 % Commonly known as: KENALOG  Apply 1 application. topically daily as needed (ear eczema).   Vitamin B-12 2500 MCG Subl Place 2,500 mcg under the tongue in the morning.        Diagnostic Studies: DG Knee Right Port Result Date: 03/13/2024 CLINICAL DATA:  Post right knee replacement EXAM: PORTABLE RIGHT KNEE - 1-2 VIEW COMPARISON:  None Available. FINDINGS: Changes of right knee replacement. Soft tissue and joint space gas. No hardware or bony complicating feature. IMPRESSION: Right knee replacement.  No visible complicating feature. Electronically Signed   By: Franky Crease M.D.   On: 03/13/2024 16:15    Disposition: Discharge disposition: 06-Home-Health Care Svc          Follow-up Information      Josefina Chew, MD. Go on 03/28/2024.   Specialty: Orthopedic Surgery Why: Your appointment is scheduled for 2:00 Contact information: [address] [place] [address] [phone]         [facility] Follow up.   Why: HHPT will provide 6 home visits prior to starting outpatient physical therapy        [facility] Go on 03/28/2024.   Why: Your outpatient physical therapy is scheduled. they will call you with a time Contact  information: [facility] [address] [place] [address] [phone]                  Signed: Army MARLA Delores DEVONNA 03/14/2024, 7:31 PM

## 2024-03-14 NOTE — Plan of Care (Signed)
  Problem: Clinical Measurements: Goal: Will remain free from infection Outcome: Progressing Goal: Respiratory complications will improve Outcome: Progressing   Problem: Activity: Goal: Risk for activity intolerance will decrease Outcome: Progressing   Problem: Nutrition: Goal: Adequate nutrition will be maintained Outcome: Progressing   Problem: Coping: Goal: Level of anxiety will decrease Outcome: Progressing   Problem: Elimination: Goal: Will not experience complications related to bowel motility Outcome: Progressing   Problem: Pain Managment: Goal: General experience of comfort will improve and/or be controlled Outcome: Progressing   Problem: Safety: Goal: Ability to remain free from injury will improve Outcome: Progressing   Problem: Skin Integrity: Goal: Risk for impaired skin integrity will decrease Outcome: Progressing   Problem: Pain Management: Goal: Pain level will decrease with appropriate interventions Outcome: Progressing

## 2024-03-14 NOTE — TOC Transition Note (Signed)
 Transition of Care [facility]) - Discharge Note   Patient Details  Name: Maria Wang MRN: [id number] Date of Birth: June 01, 1951  Transition of Care [facility]) CM/SW Contact:  NORMAN ASPEN, LCSW Phone Number: 03/14/2024, 9:43 AM   Clinical Narrative:     Met with pt who confirms she has needed DME in the home.  Pt aware HHPT prearranged with [facility] via ortho MD office prior to surgery.  No further IP CM needs.  Final next level of care: Home w Home Health Services Barriers to Discharge: No Barriers Identified   Patient Goals and CMS Choice Patient states their goals for this hospitalization and ongoing recovery are:: return home          Discharge Placement                       Discharge Plan and Services Additional resources added to the After Visit Summary for                  DME Arranged: N/A DME Agency: NA       HH Arranged: PT HH Agency: [facility] Librarian, academic)        Social Drivers of Health (SDOH) Interventions SDOH Screenings   Food Insecurity: No Food Insecurity (03/13/2024)  Housing: Unknown (03/13/2024)  Transportation Needs: No Transportation Needs (03/13/2024)  Utilities: Not At Risk (03/13/2024)  Alcohol  Screen: Low Risk  (01/31/2024)  Depression (PHQ2-9): Low Risk  (01/31/2024)  Financial Resource Strain: Low Risk  (01/31/2024)  Physical Activity: Sufficiently Active (01/31/2024)  Social Connections: Socially Integrated (03/13/2024)  Stress: No Stress Concern Present (01/31/2024)  Tobacco Use: Medium Risk (03/13/2024)  Health Literacy: Adequate Health Literacy (01/31/2024)     Readmission Risk Interventions     No data to display

## 2024-03-14 NOTE — Progress Notes (Signed)
     Subjective: 1 Day Post-Op s/p Procedure(s): ARTHROPLASTY, KNEE, TOTAL   Patient is alert, oriented. Patient reports pain as so far well controlled.  Denies chest pain, SOB, Calf pain. No nausea/vomiting.  No other complaints.    Objective:  PE: VITALS:   Vitals:   03/13/24 2034 03/14/24 0220 03/14/24 0537 03/14/24 0752  BP: 133/64 (!) 108/58 (!) 112/59 135/71  Pulse: (!) 59 (!) 57 (!) 55 (!) 54  Resp: 17 16 17    Temp: 97.8 F (36.6 C) 99 F (37.2 C) 98.8 F (37.1 C)   TempSrc: Oral Oral Oral   SpO2: 98% 96% 96% 96%  Weight:      Height:        Sensation intact distally Intact pulses distally Dorsiflexion/Plantar flexion intact Incision: scant drainage  LABS  Results for orders placed or performed during the hospital encounter of 03/13/24 (from the past 24 hours)  CBC     Status: Abnormal   Collection Time: 03/14/24  3:28 AM  Result Value Ref Range   WBC 10.9 (H) 4.0 - 10.5 K/uL   RBC 3.96 3.87 - 5.11 MIL/uL   Hemoglobin 12.2 12.0 - 15.0 g/dL   HCT 61.8 63.9 - 53.9 %   MCV 96.2 80.0 - 100.0 fL   MCH 30.8 26.0 - 34.0 pg   MCHC 32.0 30.0 - 36.0 g/dL   RDW 86.1 88.4 - 84.4 %   Platelets 202 150 - 400 K/uL   nRBC 0.0 0.0 - 0.2 %  Basic metabolic panel     Status: Abnormal   Collection Time: 03/14/24  3:28 AM  Result Value Ref Range   Sodium 136 135 - 145 mmol/L   Potassium 4.7 3.5 - 5.1 mmol/L   Chloride 104 98 - 111 mmol/L   CO2 25 22 - 32 mmol/L   Glucose, Bld 125 (H) 70 - 99 mg/dL   BUN 18 8 - 23 mg/dL   Creatinine, Ser 9.30 0.44 - 1.00 mg/dL   Calcium  8.3 (L) 8.9 - 10.3 mg/dL   GFR, Estimated >39 >39 mL/min   Anion gap 7 5 - 15    DG Knee Right Port Result Date: 03/13/2024 CLINICAL DATA:  Post right knee replacement EXAM: PORTABLE RIGHT KNEE - 1-2 VIEW COMPARISON:  None Available. FINDINGS: Changes of right knee replacement. Soft tissue and joint space gas. No hardware or bony complicating feature. IMPRESSION: Right knee replacement.  No visible  complicating feature. Electronically Signed   By: Franky Crease M.D.   On: 03/13/2024 16:15    Today's  total administered Morphine  Milligram Equivalents: 22.5 Yesterday's total administered Morphine  Milligram Equivalents: 22.5  Assessment/Plan: Principal Problem:   S/P TKR (total knee replacement), right    1 Day Post-Op s/p Procedure(s): ARTHROPLASTY, KNEE, TOTAL  Weightbearing: WBAT RLE Insicional and dressing care: Reinforce dressings as needed VTE prophylaxis: Aspirin  325mg  BID x 30 days Pain control: continue current regimen Follow - up plan: 2 weeks with Dr. Josefina Dispo: pending progress with PT. Patient needed 3 days to progress appropriately for discharge after left side was done, hopeful that she will be able to move better now that both knees have been replaced.    Contact information:   Maria Wang, Maria Wang [address]  After hours and holidays please check [email or web address] for group call information for [facility]  Maria Wang 03/14/2024, 9:11 AM

## 2024-03-14 NOTE — Care Management Obs Status (Signed)
 MEDICARE OBSERVATION STATUS NOTIFICATION   Patient Details  Name: Maria Wang MRN: [id number] Date of Birth: 1951/02/22   Medicare Observation Status Notification Given:  Yes    NORMAN ASPEN, LCSW 03/14/2024, 10:21 AM

## 2024-03-15 DIAGNOSIS — Z96653 Presence of artificial knee joint, bilateral: Secondary | ICD-10-CM | POA: Diagnosis not present

## 2024-03-15 DIAGNOSIS — Z853 Personal history of malignant neoplasm of breast: Secondary | ICD-10-CM | POA: Diagnosis not present

## 2024-03-15 DIAGNOSIS — Z9011 Acquired absence of right breast and nipple: Secondary | ICD-10-CM | POA: Diagnosis not present

## 2024-03-15 DIAGNOSIS — Z471 Aftercare following joint replacement surgery: Secondary | ICD-10-CM | POA: Diagnosis not present

## 2024-03-15 DIAGNOSIS — Z604 Social exclusion and rejection: Secondary | ICD-10-CM | POA: Diagnosis not present

## 2024-03-15 DIAGNOSIS — F32A Depression, unspecified: Secondary | ICD-10-CM | POA: Diagnosis not present

## 2024-03-15 DIAGNOSIS — F419 Anxiety disorder, unspecified: Secondary | ICD-10-CM | POA: Diagnosis not present

## 2024-03-15 DIAGNOSIS — Z791 Long term (current) use of non-steroidal anti-inflammatories (NSAID): Secondary | ICD-10-CM | POA: Diagnosis not present

## 2024-03-15 DIAGNOSIS — I951 Orthostatic hypotension: Secondary | ICD-10-CM | POA: Diagnosis not present

## 2024-03-15 DIAGNOSIS — I10 Essential (primary) hypertension: Secondary | ICD-10-CM | POA: Diagnosis not present

## 2024-03-15 DIAGNOSIS — Z79891 Long term (current) use of opiate analgesic: Secondary | ICD-10-CM | POA: Diagnosis not present

## 2024-03-15 DIAGNOSIS — Z87442 Personal history of urinary calculi: Secondary | ICD-10-CM | POA: Diagnosis not present

## 2024-03-15 DIAGNOSIS — Z87891 Personal history of nicotine dependence: Secondary | ICD-10-CM | POA: Diagnosis not present

## 2024-03-15 DIAGNOSIS — Z7982 Long term (current) use of aspirin: Secondary | ICD-10-CM | POA: Diagnosis not present

## 2024-03-15 DIAGNOSIS — E079 Disorder of thyroid, unspecified: Secondary | ICD-10-CM | POA: Diagnosis not present

## 2024-03-28 DIAGNOSIS — M1711 Unilateral primary osteoarthritis, right knee: Secondary | ICD-10-CM | POA: Diagnosis not present

## 2024-03-30 DIAGNOSIS — M1711 Unilateral primary osteoarthritis, right knee: Secondary | ICD-10-CM | POA: Diagnosis not present

## 2024-03-31 ENCOUNTER — Other Ambulatory Visit: Payer: Self-pay | Admitting: Medical Genetics

## 2024-04-04 DIAGNOSIS — M1711 Unilateral primary osteoarthritis, right knee: Secondary | ICD-10-CM | POA: Diagnosis not present

## 2024-04-05 ENCOUNTER — Ambulatory Visit (INDEPENDENT_AMBULATORY_CARE_PROVIDER_SITE_OTHER): Admitting: Family Medicine

## 2024-04-05 ENCOUNTER — Encounter: Payer: Self-pay | Admitting: Family Medicine

## 2024-04-05 VITALS — BP 139/82 | HR 62 | Temp 97.5°F | Resp 18 | Ht 63.0 in | Wt 234.1 lb

## 2024-04-05 DIAGNOSIS — M810 Age-related osteoporosis without current pathological fracture: Secondary | ICD-10-CM | POA: Diagnosis not present

## 2024-04-05 DIAGNOSIS — G522 Disorders of vagus nerve: Secondary | ICD-10-CM

## 2024-04-05 DIAGNOSIS — N3281 Overactive bladder: Secondary | ICD-10-CM

## 2024-04-05 DIAGNOSIS — F3341 Major depressive disorder, recurrent, in partial remission: Secondary | ICD-10-CM

## 2024-04-05 DIAGNOSIS — I10 Essential (primary) hypertension: Secondary | ICD-10-CM

## 2024-04-05 MED ORDER — FLUOXETINE HCL 20 MG PO CAPS: 20.0000 mg | ORAL_CAPSULE | Freq: Every day | ORAL | 1 refills | Status: AC

## 2024-04-05 MED ORDER — AMLODIPINE BESYLATE 10 MG PO TABS: 10.0000 mg | ORAL_TABLET | Freq: Every day | ORAL | 3 refills | Status: AC

## 2024-04-05 MED ORDER — LOSARTAN POTASSIUM 100 MG PO TABS: 100.0000 mg | ORAL_TABLET | Freq: Every morning | ORAL | 1 refills | Status: AC

## 2024-04-05 MED ORDER — BUPROPION HCL ER (XL) 300 MG PO TB24: 300.0000 mg | ORAL_TABLET | Freq: Every morning | ORAL | 1 refills | Status: AC

## 2024-04-05 MED ORDER — GABAPENTIN 600 MG PO TABS: 600.0000 mg | ORAL_TABLET | Freq: Three times a day (TID) | ORAL | 1 refills | Status: DC

## 2024-04-05 MED ORDER — OXYBUTYNIN CHLORIDE ER 10 MG PO TB24: 10.0000 mg | ORAL_TABLET | Freq: Every day | ORAL | 3 refills | Status: AC

## 2024-04-05 NOTE — Patient Instructions (Signed)

## 2024-04-05 NOTE — Progress Notes (Signed)
 Subjective:     Patient ID: Maria Wang, female    DOB: 18-Oct-1950, 73 y.o.   MRN: [id number]  Chief Complaint  Patient presents with   Medical Management of Chronic Issues    6 month follow-up Not fasting     HPI Discussed the use of AI scribe software for clinical note transcription with the patient, who gave verbal consent to proceed.  History of Present Illness Maria Wang is a 73 year old female with prediabetes, hypertension, and depression who presents for follow-up.  She recently underwent knee surgery and found the first two weeks post-surgery to be very challenging. She is currently engaged in physical therapy and has reduced her pain medication to one 5 mg tablet at night.  Her blood pressure is managed with amlodipine  10 mg and losartan  100 mg. She noted a slightly higher reading this morning. She experiences dizziness, particularly when standing up, which has been present since before her surgery. A month ago, she sprained her hand after getting up too quickly and missing the bed.no cp/sob/cough/palp.  Some chronic edema-wears compression stockings  For osteoporosis, she takes alendronate  70 mg weekly but has been off schedule due to the surgery.  Her depression is managed with Wellbutrin  300 mg and Prozac  20 mg. No current thoughts of suicide or homicide.  She takes oxybutynin  10 mg for overactive bladder, which has been exacerbated post-surgery due to catheterization. She experiences urgency and occasional incontinence but manages with incontinence panties and prophylactic voiding.  She is currently on aspirin  post-surgery for 30 days and takes a multivitamin with iron. She consumes a lot of red meat and does not feel anemic despite a comment from a physical therapist about her appearance.  Her A1c was last checked in Oct and was 5.5. She has had recent blood work in August and does not feel the need for additional tests at this time.  No chest pain,  shortness of breath, or new leg swelling aside from post-surgical changes. No burning or signs of urinary infection.    Health Maintenance Due  Topic Date Due   INFLUENZA VACCINE  03/02/2024    Past Medical History:  Diagnosis Date   Allergy    Anxiety    Arthritis    Cancer (HCC)    right breast lumpectomy   Cataract    Complication of anesthesia    slow to wake up   Depression    Hepatitis    prb A   History of kidney stones    Hypertension    PONV (postoperative nausea and vomiting)    Sleep apnea    no longr uses cpap   Thyroid  disease     Past Surgical History:  Procedure Laterality Date   CHOLECYSTECTOMY  1987   EYE SURGERY Bilateral    cataract extraction w/ IOL   KNEE ARTHROSCOPY W/ DEBRIDEMENT Left 1984   LAPAROSCOPIC SMALL BOWEL RESECTION N/A 01/08/2022   Procedure: LAPAROSCOPIC SMALL BOWEL RESECTION;  Surgeon: Gladis Cough, MD;  Location: [facility];  Service: General;  Laterality: N/A;   right breast lumpectomy   2015   TOTAL KNEE ARTHROPLASTY Left 11/08/2023   Procedure: ARTHROPLASTY, KNEE, TOTAL;  Surgeon: Josefina Chew, MD;  Location: [facility];  Service: Orthopedics;  Laterality: Left;   TOTAL KNEE ARTHROPLASTY Right 03/13/2024   Procedure: ARTHROPLASTY, KNEE, TOTAL;  Surgeon: Josefina Chew, MD;  Location: [facility];  Service: Orthopedics;  Laterality: Right;   UMBILICAL HERNIA REPAIR     WISDOM  TOOTH EXTRACTION  1985     Current Outpatient Medications:    alendronate  (FOSAMAX ) 70 MG tablet, TAKE 1 TABLET BY MOUTH ONCE WEEKLY., Disp: 12 tablet, Rfl: 3   aspirin  EC 325 MG tablet, Take 1 tablet (325 mg total) by mouth 2 (two) times daily., Disp: 60 tablet, Rfl: 0   baclofen  (LIORESAL ) 10 MG tablet, Take 1 tablet (10 mg total) by mouth 3 (three) times daily. As needed for muscle spasm, Disp: 30 tablet, Rfl: 0   Calcium  Carb-Cholecalciferol  (CALCIUM  600+D3 PO), Take 1 tablet by mouth in the morning and at bedtime., Disp: , Rfl:    cholecalciferol  (VITAMIN D )  25 MCG (1000 UNIT) tablet, Take 1,000 Units by mouth in the morning., Disp: , Rfl:    Coenzyme Q10 300 MG CAPS, Take 300 mg by mouth in the morning., Disp: , Rfl:    Cyanocobalamin (VITAMIN B-12) 2500 MCG SUBL, Place 2,500 mcg under the tongue in the morning., Disp: , Rfl:    dicyclomine  (BENTYL ) 10 MG capsule, Take 10 mg by mouth 4 (four) times daily as needed for spasms., Disp: , Rfl:    Glucosamine-Chondroitin (COSAMIN DS PO), Take 2 tablets by mouth in the morning., Disp: , Rfl:    MELATONIN PO, Take 1 tablet by mouth at bedtime as needed (sleep)., Disp: , Rfl:    Multiple Vitamin (MULTIVITAMIN WITH MINERALS) TABS tablet, Take 1 tablet by mouth in the morning. One A Day for Women 50+, Disp: , Rfl:    Multiple Vitamins-Minerals (MACULAR HEALTH FORMULA PO), Take 1 capsule by mouth in the morning and at bedtime., Disp: , Rfl:    naproxen  sodium (ALEVE ) 220 MG tablet, Take 440 mg by mouth 2 (two) times daily as needed (pain.)., Disp: , Rfl:    Omega-3 Fatty Acids (FISH OIL) 1000 MG CAPS, Take 1,000 mg by mouth in the morning., Disp: , Rfl:    oxyCODONE  (OXY IR/ROXICODONE ) 5 MG immediate release tablet, Take 5 mg by mouth every 6 (six) hours as needed., Disp: , Rfl:    Polyethyl Glycol-Propyl Glycol (LUBRICANT EYE DROPS) 0.4-0.3 % SOLN, Place 1-2 drops into both eyes 3 (three) times daily as needed (dry/irritated eyes.)., Disp: , Rfl:    triamcinolone  cream (KENALOG ) 0.1 %, Apply 1 application. topically daily as needed (ear eczema)., Disp: , Rfl:    amLODipine  (NORVASC ) 10 MG tablet, Take 1 tablet (10 mg total) by mouth daily., Disp: 90 tablet, Rfl: 3   buPROPion  (WELLBUTRIN  XL) 300 MG 24 hr tablet, Take 1 tablet (300 mg total) by mouth every morning., Disp: 90 tablet, Rfl: 1   FLUoxetine  (PROZAC ) 20 MG capsule, Take 1 capsule (20 mg total) by mouth daily., Disp: 90 capsule, Rfl: 1   gabapentin  (NEURONTIN ) 600 MG tablet, Take 1 tablet (600 mg total) by mouth 3 (three) times daily., Disp: 270 tablet,  Rfl: 1   losartan  (COZAAR ) 100 MG tablet, Take 1 tablet (100 mg total) by mouth in the morning., Disp: 90 tablet, Rfl: 1   oxybutynin  (DITROPAN -XL) 10 MG 24 hr tablet, Take 1 tablet (10 mg total) by mouth at bedtime., Disp: 90 tablet, Rfl: 3  Allergies  Allergen Reactions   Penicillin G Hives    Other Reaction(s): hives as child Tolerated Ancef  11/09/23   Tetanus Immune Globulin Swelling    Other Reaction(s): severe swelling at site, upper arm   Tetanus Toxoids Swelling    Arm swelled up   ROS neg/noncontributory except as noted HPI/below  Objective:     BP 139/82   Pulse 62   Temp (!) 97.5 F (36.4 C) (Temporal)   Resp 18   Ht 5' 3 (1.6 m)   Wt 234 lb 2 oz (106.2 kg)   SpO2 98%   BMI 41.47 kg/m  Wt Readings from Last 3 Encounters:  04/05/24 234 lb 2 oz (106.2 kg)  03/13/24 225 lb (102.1 kg)  02/29/24 225 lb (102.1 kg)    Physical Exam   Gen: WDWN NAD HEENT: NCAT, conjunctiva not injected, sclera nonicteric NECK:  supple, no thyromegaly, no nodes, no carotid bruits CARDIAC: RRR, S1S2+, no murmur. DP 2+B LUNGS: CTAB. No wheezes ABDOMEN:  BS+, soft, NTND, No HSM, no masses EXT:  compression stockings MSK: no gross abnormalities. Walker and R knee healing NEURO: A&O x3.  CN II-XII intact.  PSYCH: normal mood. Good eye contact  Reviewed labs     Assessment & Plan:  Essential hypertension -     amLODIPine  Besylate; Take 1 tablet (10 mg total) by mouth daily.  Dispense: 90 tablet; Refill: 3 -     Losartan  Potassium; Take 1 tablet (100 mg total) by mouth in the morning.  Dispense: 90 tablet; Refill: 1  Major depressive disorder, recurrent, in partial remission (HCC) -     buPROPion  HCl ER (XL); Take 1 tablet (300 mg total) by mouth every morning.  Dispense: 90 tablet; Refill: 1 -     FLUoxetine  HCl; Take 1 capsule (20 mg total) by mouth daily.  Dispense: 90 capsule; Refill: 1  Vagus nerve disease -     Gabapentin ; Take 1 tablet (600 mg total) by mouth 3  (three) times daily.  Dispense: 270 tablet; Refill: 1  Age-related osteoporosis without current pathological fracture  OAB (overactive bladder) -     oxyBUTYnin  Chloride ER; Take 1 tablet (10 mg total) by mouth at bedtime.  Dispense: 90 tablet; Refill: 3  Assessment and Plan Assessment & Plan Prediabetes   Her A1c was last checked in October at 5.5. She chose not to have blood work today. Continue current management and consider rechecking A1c in six months.work on diet/exercise  Essential hypertension   Blood pressure is well-controlled with amlodipine  10 mg and losartan  100 mg, with home readings in the 120s/70s-80s. She reports occasional dizziness, especially when standing, consistent with known vagal dysfunction. No chest pain or shortness of breath. Continue current medications, refill meds, and advise getting up slowly and staying well hydrated to manage dizziness.  Depression   Depression is well-managed with Wellbutrin  300 mg and Prozac  20 mg. Her mood is stable with no current thoughts of suicide or homicide. Continue current medications and refill prescriptions.  Age-related osteoporosis   Osteoporosis is managed with alendronate  70 mg weekly. She has been off schedule due to recent surgery but plans to resume regular dosing. Set a phone reminder to take alendronate  weekly and resume regular dosing.  Overactive bladder   Symptoms have worsened post-surgery, likely due to catheterization, but there are no signs of infection. She uses incontinence panties as needed. Continue oxybutynin  10 mg and advise performing prophylactic voiding to manage symptoms.  Disorders of vagus nerve   Vagal dysfunction causes dizziness, especially upon standing, with symptoms present before surgery. Advise getting up slowly and staying well hydrated.    Return in about 6 months (around 10/03/2024) for chronic follow-up.  Jenkins CHRISTELLA Carrel, MD

## 2024-04-09 DIAGNOSIS — M1711 Unilateral primary osteoarthritis, right knee: Secondary | ICD-10-CM | POA: Diagnosis not present

## 2024-04-12 DIAGNOSIS — M1711 Unilateral primary osteoarthritis, right knee: Secondary | ICD-10-CM | POA: Diagnosis not present

## 2024-04-16 DIAGNOSIS — M1711 Unilateral primary osteoarthritis, right knee: Secondary | ICD-10-CM | POA: Diagnosis not present

## 2024-04-19 DIAGNOSIS — M1711 Unilateral primary osteoarthritis, right knee: Secondary | ICD-10-CM | POA: Diagnosis not present

## 2024-04-23 DIAGNOSIS — M1711 Unilateral primary osteoarthritis, right knee: Secondary | ICD-10-CM | POA: Diagnosis not present

## 2024-04-26 DIAGNOSIS — M1711 Unilateral primary osteoarthritis, right knee: Secondary | ICD-10-CM | POA: Diagnosis not present

## 2024-04-30 DIAGNOSIS — M1711 Unilateral primary osteoarthritis, right knee: Secondary | ICD-10-CM | POA: Diagnosis not present

## 2024-05-02 DIAGNOSIS — M1711 Unilateral primary osteoarthritis, right knee: Secondary | ICD-10-CM | POA: Diagnosis not present

## 2024-05-14 DIAGNOSIS — M1711 Unilateral primary osteoarthritis, right knee: Secondary | ICD-10-CM | POA: Diagnosis not present

## 2024-05-17 DIAGNOSIS — M1711 Unilateral primary osteoarthritis, right knee: Secondary | ICD-10-CM | POA: Diagnosis not present

## 2024-05-21 ENCOUNTER — Encounter: Payer: Self-pay | Admitting: Family Medicine

## 2024-05-21 ENCOUNTER — Ambulatory Visit (INDEPENDENT_AMBULATORY_CARE_PROVIDER_SITE_OTHER): Admitting: Family Medicine

## 2024-05-21 VITALS — BP 138/78 | HR 66 | Temp 97.8°F | Ht 63.0 in | Wt 240.8 lb

## 2024-05-21 DIAGNOSIS — R300 Dysuria: Secondary | ICD-10-CM

## 2024-05-21 LAB — POC URINALSYSI DIPSTICK (AUTOMATED)
Bilirubin, UA: NEGATIVE
Blood, UA: 10
Glucose, UA: NEGATIVE
Ketones, UA: NEGATIVE
Nitrite, UA: NEGATIVE
Protein, UA: POSITIVE — AB
Spec Grav, UA: 1.015 (ref 1.010–1.025)
Urobilinogen, UA: 0.2 U/dL
pH, UA: 6 (ref 5.0–8.0)

## 2024-05-21 MED ORDER — CEPHALEXIN 500 MG PO CAPS: 500.0000 mg | ORAL_CAPSULE | Freq: Two times a day (BID) | ORAL | 0 refills | Status: AC

## 2024-05-21 NOTE — Progress Notes (Signed)
 Subjective:     Patient ID: Maria Wang, female    DOB: 06-Apr-1951, 73 y.o.   MRN: [id number]  Chief Complaint  Patient presents with   Dysuria    Discussed the use of AI scribe software for clinical note transcription with the patient, who gave verbal consent to proceed.  History of Present Illness Maria Wang is a 73 year old female who presents with symptoms suggestive of a urinary tract infection.  She has been experiencing dysuria, specifically a burning sensation at the end of urination, for the past week and a half. She has been consuming large quantities of liquids, including cranberry juice, in an attempt to alleviate symptoms.  No increased frequency of urination, fevers, chills, lower abdominal pain, or new back pain. She has a history of urinary tract infections but notes it has been many years since her last episode. She has been getting up several times a night to urinate since her knee surgery, which she attributes to increased fluid intake.  Approximately nine weeks ago, she underwent right knee replacement surgery on August 12th and is currently recovering. She mentions using incontinence panties almost all the time but has recently limited her use to a maximum of twelve hours a day to prevent potential irritation. No vag d/c nor itching  She has a known allergy to penicillin, which causes hives, and is uncertain about her previous use of Keflex. She is still attending physical therapy and notes that this is the first time she has gone out without her cane since the surgery.    There are no preventive care reminders to display for this patient.   Past Medical History:  Diagnosis Date   Allergy    Anxiety    Arthritis    Cancer (HCC)    right breast lumpectomy   Cataract    Complication of anesthesia    slow to wake up   Depression    Hepatitis    prb A   History of kidney stones    Hypertension    PONV (postoperative nausea and vomiting)     Sleep apnea    no longr uses cpap   Thyroid  disease     Past Surgical History:  Procedure Laterality Date   CHOLECYSTECTOMY  1987   EYE SURGERY Bilateral    cataract extraction w/ IOL   KNEE ARTHROSCOPY W/ DEBRIDEMENT Left 1984   LAPAROSCOPIC SMALL BOWEL RESECTION N/A 01/08/2022   Procedure: LAPAROSCOPIC SMALL BOWEL RESECTION;  Surgeon: Gladis Cough, MD;  Location: [facility];  Service: General;  Laterality: N/A;   right breast lumpectomy   2015   TOTAL KNEE ARTHROPLASTY Left 11/08/2023   Procedure: ARTHROPLASTY, KNEE, TOTAL;  Surgeon: Josefina Chew, MD;  Location: [facility];  Service: Orthopedics;  Laterality: Left;   TOTAL KNEE ARTHROPLASTY Right 03/13/2024   Procedure: ARTHROPLASTY, KNEE, TOTAL;  Surgeon: Josefina Chew, MD;  Location: [facility];  Service: Orthopedics;  Laterality: Right;   UMBILICAL HERNIA REPAIR     WISDOM TOOTH EXTRACTION  1985     Current Outpatient Medications:    alendronate  (FOSAMAX ) 70 MG tablet, TAKE 1 TABLET BY MOUTH ONCE WEEKLY., Disp: 12 tablet, Rfl: 3   amLODipine  (NORVASC ) 10 MG tablet, Take 1 tablet (10 mg total) by mouth daily., Disp: 90 tablet, Rfl: 3   aspirin  EC 325 MG tablet, Take 1 tablet (325 mg total) by mouth 2 (two) times daily., Disp: 60 tablet, Rfl: 0   baclofen  (LIORESAL ) 10 MG tablet, Take  1 tablet (10 mg total) by mouth 3 (three) times daily. As needed for muscle spasm, Disp: 30 tablet, Rfl: 0   buPROPion  (WELLBUTRIN  XL) 300 MG 24 hr tablet, Take 1 tablet (300 mg total) by mouth every morning., Disp: 90 tablet, Rfl: 1   Calcium  Carb-Cholecalciferol  (CALCIUM  600+D3 PO), Take 1 tablet by mouth in the morning and at bedtime., Disp: , Rfl:    cephALEXin (KEFLEX) 500 MG capsule, Take 1 capsule (500 mg total) by mouth 2 (two) times daily for 7 days. Take for 7 days, Disp: 14 capsule, Rfl: 0   cholecalciferol  (VITAMIN D ) 25 MCG (1000 UNIT) tablet, Take 1,000 Units by mouth in the morning., Disp: , Rfl:    Coenzyme Q10 300 MG CAPS, Take 300 mg by  mouth in the morning., Disp: , Rfl:    Cyanocobalamin (VITAMIN B-12) 2500 MCG SUBL, Place 2,500 mcg under the tongue in the morning., Disp: , Rfl:    dicyclomine  (BENTYL ) 10 MG capsule, Take 10 mg by mouth 4 (four) times daily as needed for spasms., Disp: , Rfl:    FLUoxetine  (PROZAC ) 20 MG capsule, Take 1 capsule (20 mg total) by mouth daily., Disp: 90 capsule, Rfl: 1   gabapentin  (NEURONTIN ) 600 MG tablet, Take 1 tablet (600 mg total) by mouth 3 (three) times daily., Disp: 270 tablet, Rfl: 1   Glucosamine-Chondroitin (COSAMIN DS PO), Take 2 tablets by mouth in the morning., Disp: , Rfl:    losartan  (COZAAR ) 100 MG tablet, Take 1 tablet (100 mg total) by mouth in the morning., Disp: 90 tablet, Rfl: 1   MELATONIN PO, Take 1 tablet by mouth at bedtime as needed (sleep)., Disp: , Rfl:    Multiple Vitamin (MULTIVITAMIN WITH MINERALS) TABS tablet, Take 1 tablet by mouth in the morning. One A Day for Women 50+, Disp: , Rfl:    Multiple Vitamins-Minerals (MACULAR HEALTH FORMULA PO), Take 1 capsule by mouth in the morning and at bedtime., Disp: , Rfl:    naproxen  sodium (ALEVE ) 220 MG tablet, Take 440 mg by mouth 2 (two) times daily as needed (pain.)., Disp: , Rfl:    Omega-3 Fatty Acids (FISH OIL) 1000 MG CAPS, Take 1,000 mg by mouth in the morning., Disp: , Rfl:    oxybutynin  (DITROPAN -XL) 10 MG 24 hr tablet, Take 1 tablet (10 mg total) by mouth at bedtime., Disp: 90 tablet, Rfl: 3   oxyCODONE  (OXY IR/ROXICODONE ) 5 MG immediate release tablet, Take 5 mg by mouth every 6 (six) hours as needed., Disp: , Rfl:    Polyethyl Glycol-Propyl Glycol (LUBRICANT EYE DROPS) 0.4-0.3 % SOLN, Place 1-2 drops into both eyes 3 (three) times daily as needed (dry/irritated eyes.)., Disp: , Rfl:    triamcinolone  cream (KENALOG ) 0.1 %, Apply 1 application. topically daily as needed (ear eczema)., Disp: , Rfl:   Allergies  Allergen Reactions   Penicillin G Hives    Other Reaction(s): hives as child Tolerated Ancef  11/09/23    Tetanus Immune Globulin Swelling    Other Reaction(s): severe swelling at site, upper arm   Tetanus Toxoid-Containing Vaccines Swelling    Arm swelled up   ROS neg/noncontributory except as noted HPI/below      Objective:     BP 138/78   Pulse 66   Temp 97.8 F (36.6 C)   Ht 5' 3 (1.6 m)   Wt 240 lb 12.8 oz (109.2 kg)   SpO2 98%   BMI 42.66 kg/m  Wt Readings from Last 3 Encounters:  05/21/24  240 lb 12.8 oz (109.2 kg)  04/05/24 234 lb 2 oz (106.2 kg)  03/13/24 225 lb (102.1 kg)    Physical Exam GENERAL: Well developed, well nourished, no acute distress. HEAD EYES EARS NOSE THROAT: Normocephalic, atraumatic, conjunctiva not injected, sclera nonicteric. CARDIAC: Regular rate and rhythm, S1 S2 present, no murmur, ABDOMEN: Bowel sounds present, soft, suprapubic area tender, non-distended, no hepatosplenomegaly, no masses, no costovertebral angle tenderness.. MUSCULOSKELETAL: No gross abnormalities. NEUROLOGICAL: Alert and oriented x3, cranial nerves II through XII intact. PSYCHIATRIC: Normal mood, good eye contact.  Results for orders placed or performed in visit on 05/21/24  POCT Urinalysis Dipstick (Automated)   Collection Time: 05/21/24  3:17 PM  Result Value Ref Range   Color, UA yellow    Clarity, UA     Glucose, UA Negative Negative   Bilirubin, UA negative    Ketones, UA negative    Spec Grav, UA 1.015 1.010 - 1.025   Blood, UA 10    pH, UA 6.0 5.0 - 8.0   Protein, UA Positive (A) Negative   Urobilinogen, UA 0.2 0.2 or 1.0 E.U./dL   Nitrite, UA neg    Leukocytes, UA Large (3+) (A) Negative          Assessment & Plan:  Dysuria -     POCT Urinalysis Dipstick (Automated) -     Urine Culture  Other orders -     Cephalexin; Take 1 capsule (500 mg total) by mouth 2 (two) times daily for 7 days. Take for 7 days  Dispense: 14 capsule; Refill: 0    Assessment and Plan Assessment & Plan Urinary tract infection   She is suspected to have a urinary tract  infection with dysuria for the past week and a half, without fever, chills, or lower abdominal pain. Differential diagnosis includes a possible yeast infection or contact irritant from incontinence panties. Urine culture results are pending. There is an 8% chance of cross-reactivity with cephalosporins like Keflex due to her penicillin allergy. Prescribe Keflex for the infection. Review urine culture results in two days. If the culture is negative and symptoms improve, complete the antibiotic course.. Discontinue Keflex and contact the office if rash or allergic symptoms occur.   Right knee replacement, recovering   She is recovering from right knee replacement surgery performed on August 12, approximately nine weeks ago. Reports increased nocturia since surgery, possibly due to increased fluid intake. Continue physical therapy as currently attending. Monitor nocturia.     Return if symptoms worsen or fail to improve.  Jenkins CHRISTELLA Carrel, MD

## 2024-05-21 NOTE — Patient Instructions (Signed)
 Meds sent to pharm.  Let me know if not improving

## 2024-05-22 LAB — URINE CULTURE
MICRO NUMBER:: 17121354
Result:: NO GROWTH
SPECIMEN QUALITY:: ADEQUATE

## 2024-05-23 ENCOUNTER — Ambulatory Visit: Payer: Self-pay | Admitting: Family Medicine

## 2024-05-23 DIAGNOSIS — M1711 Unilateral primary osteoarthritis, right knee: Secondary | ICD-10-CM | POA: Diagnosis not present

## 2024-05-23 DIAGNOSIS — Z96651 Presence of right artificial knee joint: Secondary | ICD-10-CM | POA: Diagnosis not present

## 2024-06-01 ENCOUNTER — Other Ambulatory Visit: Payer: Self-pay | Admitting: Medical Genetics

## 2024-06-01 DIAGNOSIS — Z006 Encounter for examination for normal comparison and control in clinical research program: Secondary | ICD-10-CM

## 2024-06-06 DIAGNOSIS — M1711 Unilateral primary osteoarthritis, right knee: Secondary | ICD-10-CM | POA: Diagnosis not present

## 2024-06-14 DIAGNOSIS — M1711 Unilateral primary osteoarthritis, right knee: Secondary | ICD-10-CM | POA: Diagnosis not present

## 2024-06-21 DIAGNOSIS — M1711 Unilateral primary osteoarthritis, right knee: Secondary | ICD-10-CM | POA: Diagnosis not present

## 2024-06-26 DIAGNOSIS — M1711 Unilateral primary osteoarthritis, right knee: Secondary | ICD-10-CM | POA: Diagnosis not present

## 2024-07-02 ENCOUNTER — Other Ambulatory Visit: Payer: Self-pay

## 2024-07-02 ENCOUNTER — Other Ambulatory Visit: Payer: Self-pay | Admitting: Family Medicine

## 2024-07-02 DIAGNOSIS — G522 Disorders of vagus nerve: Secondary | ICD-10-CM

## 2024-07-02 MED ORDER — GABAPENTIN 600 MG PO TABS: 600.0000 mg | ORAL_TABLET | Freq: Three times a day (TID) | ORAL | 1 refills | Status: AC

## 2024-10-04 ENCOUNTER — Ambulatory Visit: Admitting: Family Medicine

## 2025-02-05 ENCOUNTER — Ambulatory Visit
